# Patient Record
Sex: Male | Born: 1945 | Race: Black or African American | Hispanic: No | Smoking: Never smoker
Health system: Southern US, Community
[De-identification: ages and names within clinical notes are randomized; demographics above are authoritative.]

## PROBLEM LIST (undated history)

## (undated) DIAGNOSIS — F0391 Unspecified dementia with behavioral disturbance: Secondary | ICD-10-CM

## (undated) DIAGNOSIS — N400 Enlarged prostate without lower urinary tract symptoms: Secondary | ICD-10-CM

## (undated) DIAGNOSIS — I4891 Unspecified atrial fibrillation: Secondary | ICD-10-CM

## (undated) DIAGNOSIS — R569 Unspecified convulsions: Secondary | ICD-10-CM

## (undated) DIAGNOSIS — I639 Cerebral infarction, unspecified: Secondary | ICD-10-CM

## (undated) DIAGNOSIS — I10 Essential (primary) hypertension: Secondary | ICD-10-CM

## (undated) DIAGNOSIS — I447 Left bundle-branch block, unspecified: Secondary | ICD-10-CM

## (undated) DIAGNOSIS — S2239XA Fracture of one rib, unspecified side, initial encounter for closed fracture: Secondary | ICD-10-CM

## (undated) DIAGNOSIS — H409 Unspecified glaucoma: Secondary | ICD-10-CM

## (undated) DIAGNOSIS — I2510 Atherosclerotic heart disease of native coronary artery without angina pectoris: Secondary | ICD-10-CM

## (undated) DIAGNOSIS — Z8719 Personal history of other diseases of the digestive system: Secondary | ICD-10-CM

## (undated) DIAGNOSIS — R45851 Suicidal ideations: Secondary | ICD-10-CM

## (undated) DIAGNOSIS — R55 Syncope and collapse: Principal | ICD-10-CM

## (undated) HISTORY — DX: Unspecified dementia with behavioral disturbance: F03.91

## (undated) HISTORY — DX: Suicidal ideations: R45.851

## (undated) HISTORY — PX: BRAIN SURGERY: SHX531

## (undated) HISTORY — DX: Unspecified atrial fibrillation: I48.91

## (undated) HISTORY — PX: TIBIA FRACTURE SURGERY: SHX806

## (undated) HISTORY — DX: Unspecified glaucoma: H40.9

## (undated) HISTORY — DX: Fracture of one rib, unspecified side, initial encounter for closed fracture: S22.39XA

## (undated) HISTORY — PX: CORONARY ARTERY BYPASS GRAFT: SHX141

---

## 1979-05-07 DIAGNOSIS — I639 Cerebral infarction, unspecified: Secondary | ICD-10-CM

## 1979-05-07 HISTORY — DX: Cerebral infarction, unspecified: I63.9

## 1998-09-27 ENCOUNTER — Encounter: Payer: Self-pay | Admitting: Emergency Medicine

## 1998-09-27 ENCOUNTER — Emergency Department (HOSPITAL_COMMUNITY): Admission: EM | Admit: 1998-09-27 | Discharge: 1998-09-27 | Payer: Self-pay | Admitting: Emergency Medicine

## 2006-10-31 ENCOUNTER — Ambulatory Visit: Payer: Self-pay | Admitting: Family Medicine

## 2006-10-31 ENCOUNTER — Inpatient Hospital Stay (HOSPITAL_COMMUNITY): Admission: EM | Admit: 2006-10-31 | Discharge: 2006-11-01 | Payer: Self-pay | Admitting: Emergency Medicine

## 2006-10-31 ENCOUNTER — Ambulatory Visit: Payer: Self-pay | Admitting: Cardiology

## 2006-11-01 ENCOUNTER — Encounter: Payer: Self-pay | Admitting: Cardiology

## 2006-11-01 ENCOUNTER — Ambulatory Visit: Payer: Self-pay | Admitting: Vascular Surgery

## 2006-11-16 ENCOUNTER — Emergency Department: Payer: Self-pay | Admitting: Emergency Medicine

## 2007-04-08 ENCOUNTER — Emergency Department (HOSPITAL_COMMUNITY): Admission: EM | Admit: 2007-04-08 | Discharge: 2007-04-08 | Payer: Self-pay | Admitting: Emergency Medicine

## 2008-05-08 ENCOUNTER — Emergency Department (HOSPITAL_COMMUNITY): Admission: EM | Admit: 2008-05-08 | Discharge: 2008-05-08 | Payer: Self-pay | Admitting: Emergency Medicine

## 2008-07-18 ENCOUNTER — Inpatient Hospital Stay (HOSPITAL_COMMUNITY): Admission: EM | Admit: 2008-07-18 | Discharge: 2008-07-23 | Payer: Self-pay | Admitting: Emergency Medicine

## 2008-08-08 ENCOUNTER — Ambulatory Visit: Payer: Self-pay | Admitting: Internal Medicine

## 2008-08-08 ENCOUNTER — Observation Stay (HOSPITAL_COMMUNITY): Admission: EM | Admit: 2008-08-08 | Discharge: 2008-08-11 | Payer: Self-pay | Admitting: Emergency Medicine

## 2011-01-18 NOTE — Discharge Summary (Signed)
NAME:  [name], [name] NO.:  [id number]   MEDICAL RECORD NO.:  [id number]          PATIENT TYPE:  OBV   LOCATION:  6715                         FACILITY:  [facility]   PHYSICIAN:  Alvester Morin, M.D.  DATE OF BIRTH:  Dec 20, 1945   DATE OF ADMISSION:  08/08/2008  DATE OF DISCHARGE:  08/11/2008                               DISCHARGE SUMMARY   DISCHARGE DIAGNOSES:  1. Dilantin toxicity.  2. Coronary artery disease status post coronary artery bypass graft.  3. Hypertension.  4. History of right temporal lobe stroke at the time of the coronary      artery bypass graft.  5. Seizure disorder.  6. History of alcohol abuse.  7. Glaucoma.  8. Gastroesophageal reflux disease.  9. Benign prostatic hypertrophy.   DISCHARGE MEDICATIONS:  1. Aspirin 81 mg p.o. daily.  2. Brimonidine 1 drop in each eye every 8 hours.  3. Dorzolamide 1 drop in each eye every 8 hours.  4. Hydrochlorothiazide 25 mg p.o. daily.  5. Metoprolol 25 mg p.o. daily.  6. Protonix 40 mg b.i.d.  7. Timolol 1 drop in each eye every 8 hours.  8. Travoprost 1 drop in each eye at bedtime.   Note, the patient's Dilantin is being held at time of discharge.   CONSULTATIONS:  None.   DISPOSITION AND FOLLOWUP:  The patient is to follow up at the [facility] on Friday, August 15, 2008 at 8:30 in the morning.  Note, his  Dilantin is being held at time of discharge and at the time of followup,  the physician is to restart his Dilantin at an appropriate dose.  The  patient had been taking 260 mg daily, as this was the amount that was  recommended at his last hospital discharge.  However, he was taking  roughly 560 daily because he was confused and did not receive good  discharge instructions at last hospitalization.   BRIEF HISTORY AND PHYSICAL:  Mr. Barfoot is a 65 year old African  American male with history of seizure disorder that started after a CVA  and recent hospitalization in November 2009  for Dilantin toxicity.  The  patient had routine blood draws on the day prior to admission, at which  time he was called by his doctor's office on the day of admission to  report on an elevated Dilantin level.  He was instructed by his doctor's  office to report to the nearest emergency room.  The patient denies any  double vision, tremor, headache, dizziness, shortness of breath, chest  pain, fever, or chills at the time of this presentation.   PHYSICAL EXAMINATION:  VITAL SIGNS:  Temperature 97.2, blood pressure  156/81, pulse 67, respirations 18, and oxygen saturation 99% on room  air.  GENERAL:  The patient is in no acute distress.  EYES:  Pupils equal, round, and reactive to light.  Extraocular  movements intact.  No nystagmus appreciated.  ENT:  Moist mucous membranes.  NECK:  No JVD.  RESPIRATIONS:  Clear to auscultation bilaterally.  Good air movement.  No wheezes appreciated.  CARDIOVASCULAR:  Regular rate  and rhythm.  No murmurs, rubs, or gallops.  GI:  Abdomen soft, nontender, and nondistended.  No guarding.  Good  bowel sounds.  EXTREMITIES:  No clubbing, cyanosis, or edema.  SKIN:  No lesions or rashes.  NEUROLOGIC:  Alert and oriented x3.  Cranial nerves II through XII  grossly intact.  Strength 5/5 in upper and lower extremities  bilaterally.  Deep tendon reflexes 2+ bilaterally.  No tremor  appreciated.  PSYCH:  Appropriate.   LABORATORY DATA:  Sodium 140, potassium 4.1, chloride 103, bicarb 28,  BUN 9, creatinine 0.95, and glucose 83.  White count 5.9, hemoglobin  14.3, and platelets 219.  Dilantin level of 40.6.   HOSPITAL COURSE:  1. Elevated Dilantin level.  The patient was admitted to the [facility] for evaluation of his elevated Dilantin      level.  At this time, it is inappropriate to say that the patient      has Dilantin toxicity and that he does not have any significant      findings on neurologic exam.  His physical exam was  largely benign      at time of admission.  As part of his treatment, his Dilantin level      was held and serial neurologic exams were obtained.  On the second      day of hospitalization, the patient had developed left-beating      nystagmus with rightward gaze.  The patient did not have any      ataxia, visual changes, nausea, vomiting, or headache or      respiratory changes at this time.  He continued to be observed and      serial neurologic exams undetermined a resolution of his ataxia on      his third day of hospitalization.  At time of discharge, the      patient's Dilantin was being held.  The primary team spoke with      Pharmacy and they determined that his Dilantin level was 29.7 at      the time of discharge.  We would not put the patient at risk for      seizure prior to his hospital followup appointment on August 15, 2008.  At December followup, it will be important for his physician      to determine a good dosing regimen for his Dilantin.  The patient      had stated that he got confused at his last hospitalization,      instead of taking the recommended 260 mg daily, he was taking 560      daily and that he was taking his old dose of 300 and his new dose      of 260 daily.  The patient education is paramount in this      situation.  2. Hypertension.  The patient's blood pressure was well controlled on      his home dosing regimen of hydrochlorothiazide and metoprolol.  3. Coronary artery disease.  The patient was maintained on his home      dose of aspirin and metoprolol for his hospitalization.  He denied      any chest pains throughout.  4. Glaucoma.  The patient was maintained on his home glaucoma drops      throughout his hospitalization.  5. Prophylaxis.  The patient got prophylactic doses of Protonix and      heparin throughout  his hospitalization.   DISCHARGE VITAL SIGNS:  Temperature 98.5, blood pressure 117/74, pulse  86, respirations 16, and  saturating 98% on room air.   DISCHARGE LABORATORY DATA:  Dilantin level was 29.7 at the time of  discharge.  This was down from 40.6 on day of admission.  The patient was discharged home in stable and improved condition.      Genia Del, MD  Electronically Signed      Alvester Morin, M.D.  Electronically Signed    ZF/MEDQ  D:  08/11/2008  T:  08/12/2008  Job:  [id number]   cc:   Dorena Dew, M.D.

## 2011-01-18 NOTE — Discharge Summary (Signed)
[name], BLOMQUIST NO.:  [id number]   MEDICAL RECORD NO.:  [id number]          PATIENT TYPE:  INP   LOCATION:  3015                         FACILITY:  [facility]   PHYSICIAN:  Michelene Gardener, MD    DATE OF BIRTH:  [birth year]   DATE OF ADMISSION:  07/18/2008  DATE OF DISCHARGE:  07/23/2008                               DISCHARGE SUMMARY   DISCHARGE DIAGNOSES:  1. Dilantin toxicity.  2. Coronary artery disease status post coronary artery bypass graft.  3. Hypertension.  4. History of right temporal lobe stroke at the time of the coronary      artery bypass graft.  5. Seizure disorder.  6. Heavy alcohol abuse.  7. Glaucoma.  8. Gastroesophageal reflux disease.  9. Benign prostatic hypertrophy.   DISCHARGE MEDICATIONS:  1. Dilantin 260 mg p.o. once a day.  2. Multivitamin 1 tablet p.o. once a day.  3. Terazosin.  4. Aspirin.  5. Hydralazine.  6. Omeprazole.  7. Metoprolol.   CONSULTATIONS:  None.   PROCEDURES:  None.   RADIOLOGY STUDIES:  None.   COURSE OF HOSPITALIZATION:  This is a 65 year old gentleman who came in  with symptoms and signs suggestive of Dilantin toxicity.  The patient  was admitted to the neuro floor.  Dilantin was held.  The patient was  started on IV fluids.  Neuro checks has been done very frequently.  His  Dilantin has been repeated every day.  Dilantin came down to 14.  At  that point, the patient was started on a reduced dose of Dilantin of 260  mg once a day.  Dilantin level remained stable on that dose.  The  patient was totally asymptomatic at the time of discharge.  He was  evaluated by physical therapy and occupational therapy.  The patient was  discharged from the hospital in a very stable condition and he was back  to his baseline.  All of his preadmission medications were continued in  the same  dosage except Dilantin that was reduced to 260 mg once a once a day but  distributed at 200 in the morning and 60 mg at  night.   TOTAL ASSESSMENT TIME:  40 minutes.      Michelene Gardener, MD  Electronically Signed     NAE/MEDQ  D:  07/25/2008  T:  07/26/2008  Job:  [id number]

## 2011-01-18 NOTE — H&P (Signed)
NAMEARMOND, CUTHRELL NO.:  [id number]   MEDICAL RECORD NO.:  [id number]          PATIENT TYPE:  INP   LOCATION:  3015                         FACILITY:  [facility]   PHYSICIAN:  Lonia Blood, M.D.       DATE OF BIRTH:  03-Jan-1946   DATE OF ADMISSION:  07/18/2008  DATE OF DISCHARGE:                              HISTORY & PHYSICAL   PRIMARY CARE PHYSICIAN:  Lorraine [place].   CHIEF COMPLAINT:  Falling.   HISTORY OF PRESENT ILLNESS:  Mr. Acree is a 65 year old African  American man with history of coronary artery disease, stroke, and  seizures who called his grandson today, complained that he has been  falling all over the house.  When his grandson arrived at the house, the  patient was unable to stand.  He was complaining also of dizziness and  seeing things.  The patient was brought to the emergency room where he  was diagnosed with Dilantin toxicity and we were called to admit him to  the hospital.   PAST MEDICAL HISTORY:  1. Coronary artery disease status post CABG.  2. Hypertension.  3. Right temporal lobe stroke at the time of the CABG.  4. Seizure disorder.  5. Heavy alcohol abuse.  6. Glaucoma.  7. Gastroesophageal reflux disease.  8. BPH.   HOME MEDICATIONS:  Multivitamin, terazosin, aspirin,  hydrochlorothiazide, Dilantin, omeprazole, and metoprolol.   ALLERGIES:  CODEINE.   SOCIAL HISTORY:  The patient lives alone.  He used to be a heavy drinker  but has stopped.  He is divorced.  He has got 2 living grandsons on whom  he relies for support, one of them is accompanying him to the emergency  room, namely Ellery Plunk, phone number [phone].   FAMILY HISTORY:  Mother died with heart attack.   REVIEW OF SYSTEMS:  As per HPI.  Negative for chest pain, shortness of  breath, abdominal pain, nausea, vomiting, or diarrhea.  All other  systems reviewed and negative.   PHYSICAL EXAMINATION:  VITAL SIGNS:  Upon admission, temperature is  97.0, blood  pressure 146/75, respiratory rate is 20, heart rate is 62.  GENERAL APPEARANCE:  He is one of an [place] American gentleman that is  sitting quietly on the stretcher.  Alert and oriented to place, person,  and time.  HEENT:  Head:  Normocephalic and atraumatic.  Eyes:  He is displaying  nystagmus.  Pupils are reactive to light and accommodation.  Throat is  clear.  NECK:  Supple.  No JVD.  CHEST:  Clear without wheezes, rhonchi, or crackles.  HEART:  Regular rate and rhythm without murmurs, rubs, or gallop.  ABDOMEN:  Soft, nontender.  Bowel sounds are present.  LOWER EXTREMITIES:  Without edema.  NEUROLOGIC:  Romberg sign is positive.  He has got difficulties with  rapid alternating movements.  Strength is 5/5 in all 4 extremities.   LABORATORY VALUES:  On admission, white blood cell count is 5.9,  hemoglobin of 14.1, platelet count is 186.  Sodium 142, potassium 3.5,  chloride 102, BUN 10, creatinine 1, glucose 134.  Dilantin  level is  41.4.   ASSESSMENT AND PLAN:  1. Mr. Sam is a 65 year old gentleman who presents with classical      signs and symptoms of Dilantin toxicity.  He will be admitted to      the Neurology Floor, placed on intravenous fluids, and close      neurological monitoring.  His Dilantin will be held.  2. Coronary artery disease status post coronary artery bypass      grafting.  The patient will be continued on aspirin, beta-blocker,      and ACE inhibitor without changes.  3. History of falls.  I do suspect these are secondary to Dilantin      toxicity.  For safety, I will have the patient be evaluated by      physical therapy and occupational therapy.  4. Gastroesophageal reflux disease.  I will continue the patient on      PPI.  5. DVT prophylaxis will be done using heparin.      Lonia Blood, M.D.  Electronically Signed     SL/MEDQ  D:  07/18/2008  T:  07/19/2008  Job:  [id number]   cc:   [facility] Administration
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
## 2011-01-21 NOTE — Discharge Summary (Signed)
NAMECHRISTIAN, [name] NO.:  [id number]   MEDICAL RECORD NO.:  [id number]          PATIENT TYPE:  INP   LOCATION:  3703                         FACILITY:  [facility]   PHYSICIAN:  Johney Maine, M.D.   DATE OF BIRTH:  1946-05-14   DATE OF ADMISSION:  10/31/2006  DATE OF DISCHARGE:  11/01/2006                               DISCHARGE SUMMARY   ADMITTING DIAGNOSES:  1. Syncope.  2. Hypertension.  3. Coronary artery disease.  4. History of cerebrovascular accident.  5. Bradycardia.  6. Seizure disorder.   DISCHARGE DIAGNOSES:  1. Syncope, resolved.  2. Bradycardia, sinus.  3. Hypertension.  4. Coronary artery disease.  5. History of cerebrovascular accident.  6. Seizure disorder.   HOSPITAL COURSE:  This is a pleasant 64 year old male who presented to  the emergency room after a witnessed episode of blacking out.  He  stated to have a premonition before this incident with left-sided  burning and tingling in his left lower and upper extremity and face.  He  continued to have this during his admission in the emergency room;  however, afterwards he no longer had these feelings.  He had no focal  deficits throughout his entire hospital stay.  His vitals remain stable,  except for some sinus bradycardia.  Please see the following for  details.   PROBLEM:  1. Syncope:  We ruled this patient out for acute cardiac events.      Although his last troponin cardiac enzymes was mildly elevated at      0.08, we felt this was not due to an acute event.  Cardiology did      evaluate the gentleman in the emergency room and cleared him      stating he only had left ventricular hypertrophy.  Although he had      bradycardia, this resolved by the day of discharge and when it was      bradycardia, it was sinus bradycardia.  He had no other telemetry      events.  His orthostatic blood pressures were within normal limits.      He possibly could have had a seizure, as this was an  aura with      seizure activity; however, his dilantin level remained therapeutic      and he no longer had any of these feelings.  He could follow up      with his primary care physician at [facility] for      further evaluation if needed, including an EEG.  A CT of the head      showed no acute process.  TSH was within normal limits except for a      low potassium on day of discharge of 3.2, all of his laboratory      values remained within normal limits.  We checked a fasting lipid      panel to make sure he would not have the risk for embolic stroke      and except for a low HDL and mildly elevated triglycerides of 191,      HDL  35, he is otherwise within normal limits.  Urinalysis was      within normal limits.  We did have an echocardiogram performed;      however, it had not been read or dictated at the time of discharge.      We will follow this up if there is any concern after discharge.      Carotid Dopplers were negative.  2. History of a cerebrovascular accident:  Please note that this      patient initially we felt could have had a transient ischemic      attack; however, he remained with no focal deficits.  At the time      of discharge, we felt he did not have a stroke or a transient      ischemic attack, however, needs to remain on aspirin      therapeutically.  3. Hypertension:  We continued with his hydrochlorothiazide but held      his terazosin and nifedipine due to bradycardia and possible      orthostatic hypotension.  His blood pressures remained elevated on      day of discharge.  We started lisinopril 5 mg by mouth daily.  We      ask that his primary care physician follow up with basic metabolic      panel to assess for creatinine and potassium, since we did start      this angiotensin-converting enzymes inhibitor.  We also continued      to hold his terazosin and his nifedipine on day of discharge until      it could be further evaluated by  his primary care physician.  To      treat his coronary artery disease, a beta-blocker is indicated as      is an angiotensin-converting enzymes inhibitor.  The terazosin I      feel is for most likely a benign prostatic hypertrophy; however, it      is unclear and I am afraid it could have contributed to a syncopal      episode.  4. Coronary artery disease:  Stable.  We continued his aspirin.  5. Seizure disorder:  Possibly this was seizure activity; however, it      was not witnessed and no focal deficits or postictal state was      noted.  His dilantin level was therapeutic.  We continued his      dilantin during his hospital stay and at discharge.  His primary      care physician at [facility] in [place] can      assess whether this patient needs an outpatient EEG or not.   DISCHARGE MEDICATIONS:  This patient was discharged on:  1. Hydrochlorothiazide 25 mg 1 tab p.o. daily.  2. Dilantin 100 mg 3 tabs p.o. daily.  3. Cosopt eye drops both eyes twice daily.  4. Travatan drops.  5. Omeprazole 40 mg 1 tablet p.o. daily.  6. Aspirin 81 mg 1 tablet p.o. daily.  7. Lisinopril 5 mg 1 tablet p.o. daily.   DISCHARGE FOLLOWUP INSTRUCTIONS:  The patient was instructed to follow  up with [facility] and set up an appointment with his primary care  physician, Dr. __________ , telephone number is [phone].  Please  note that I attempted on several occasions to try to make his  appointment at the [facility]; however, I continued to be on hold and  the patient was adamantly demanding to be discharged as  soon as  possible.  He did understand that it was very, very important for him to  call his physician and set up an appointment and he was not willing to  wait for me to do this, and he said he would it himself.   Patient was instructed to not drive.  He was also advised that he should really not be living alone as this episode could happen again.  He  adamantly stated  he wants to live alone and does not care, he could do  this by himself and he will return back to his home with no other  support needed.   DISCHARGE FOLLOWUP:  We will follow up an echo and if it is abnormal, we  can let his primary care physician know.   Patient was instructed to stopping taking nifedipine and terazosin until  he sees his primary care physician at the [facility] in [place].   This patient was discharged in improved and stable condition.   IMAGES:  1. Head CT showed no acute process or bleed.  2. Chest x-ray was within normal limits.  3. Carotid Dopplers negative for stenosis.  4. Echocardiogram results are pending at this time.   CONSULTATIONS:  Cardiology was consulted prior to [place] seeing this patient  informally in the emergency room to evaluate EKG, otherwise no consults.   PERTINENT LABORATORY DATA:  Please note that except for a low potassium  on day of discharge of 3.3, which we repleted with 40 mEq of potassium,  his basic metabolic panel and comprehensive metabolic panel remain  within normal limits.  His troponins were essentially normal except for  one of 0.08, which was his last set.  Dilantin level was within normal  limits and therapeutic level.  No other pertinent labs.  Fasting lipid  panel showed total cholesterol 145, triglycerides 191, HDL 35, LDL 72,  TSH normal at 1.199.  Urinalysis was totally within normal limits.           ______________________________  Johney Maine, M.D.     JT/MEDQ  D:  11/01/2006  T:  11/02/2006  Job:  [id number]   cc:   William A. Hensel, M.D.  Dr. Tammy Sours

## 2011-06-07 LAB — COMPREHENSIVE METABOLIC PANEL
ALT: 30
AST: 36
Albumin: 3.2 — ABNORMAL LOW
Alkaline Phosphatase: 86
BUN: 8
CO2: 27
Calcium: 8.8
Chloride: 103
Creatinine, Ser: 0.9
GFR calc Af Amer: >60
GFR calc non Af Amer: >60
Glucose, Bld: 121 — ABNORMAL HIGH
Potassium: 4
Sodium: 142
Total Bilirubin: 0.5
Total Protein: 6.3

## 2011-06-07 LAB — BASIC METABOLIC PANEL
CO2: 24
Calcium: 8.5
GFR calc Af Amer: >60
GFR calc Af Amer: >60
GFR calc Af Amer: >60
GFR calc non Af Amer: >60
GFR calc non Af Amer: >60
GFR calc non Af Amer: >60
GFR calc non Af Amer: >60
Glucose, Bld: 89
Glucose, Bld: 91
Glucose, Bld: 97
Potassium: 3.1 — ABNORMAL LOW
Potassium: 3.5
Potassium: 3.8
Potassium: 3.9
Sodium: 141
Sodium: 141
Sodium: 142
Sodium: 144

## 2011-06-07 LAB — CBC
HCT: 41.2
HCT: 42.8
Hemoglobin: 13.8
Hemoglobin: 14.1
MCHC: 32.9
MCV: 84.1
Platelets: 165
Platelets: 186
RBC: 5.09
RDW: 13.9
WBC: 4.6
WBC: 5.9

## 2011-06-07 LAB — POCT I-STAT, CHEM 8
BUN: 10
Calcium, Ion: 1.25
Creatinine, Ser: 1.1
TCO2: 30

## 2011-06-07 LAB — DIFFERENTIAL
Basophils Absolute: 0
Basophils Relative: 0
Eosinophils Absolute: 0.3
Eosinophils Relative: 5
Lymphocytes Relative: 22
Lymphs Abs: 1.3
Monocytes Absolute: 0.6
Monocytes Relative: 11
Neutro Abs: 3.7
Neutrophils Relative %: 62

## 2011-06-07 LAB — URINALYSIS, ROUTINE W REFLEX MICROSCOPIC
Bilirubin Urine: NEGATIVE
Glucose, UA: NEGATIVE
Hgb urine dipstick: NEGATIVE
Ketones, ur: NEGATIVE
Protein, ur: NEGATIVE

## 2011-06-07 LAB — PHENYTOIN LEVEL, TOTAL
Phenytoin Lvl: 14.6
Phenytoin Lvl: 21.6 — ABNORMAL HIGH
Phenytoin Lvl: 41.4

## 2011-06-08 LAB — POCT I-STAT, CHEM 8
BUN: 13
Calcium, Ion: 1.15
Chloride: 105
Glucose, Bld: 106 — ABNORMAL HIGH
HCT: 44
Potassium: 3.7

## 2011-06-08 LAB — POCT CARDIAC MARKERS: Troponin i, poc: <0.05

## 2011-06-10 LAB — BASIC METABOLIC PANEL
BUN: 14 mg/dL (ref 6–23)
CO2: 29 mEq/L (ref 19–32)
Calcium: 9.7 mg/dL (ref 8.4–10.5)
Chloride: 103 mEq/L (ref 96–112)
Creatinine, Ser: 0.95 mg/dL (ref 0.4–1.5)
Glucose, Bld: 97 mg/dL (ref 70–99)

## 2011-06-10 LAB — CBC
HCT: 42.6 % (ref 39.0–52.0)
MCHC: 33.1 g/dL (ref 30.0–36.0)
MCV: 82.9 fL (ref 78.0–100.0)
MCV: 84.5 fL (ref 78.0–100.0)
Platelets: 205 10*3/uL (ref 150–400)
Platelets: 219 10*3/uL (ref 150–400)
RBC: 5 MIL/uL (ref 4.22–5.81)
RBC: 5.14 MIL/uL (ref 4.22–5.81)
RDW: 14.7 % (ref 11.5–15.5)
WBC: 5.9 10*3/uL (ref 4.0–10.5)

## 2011-06-10 LAB — LIPID PANEL
HDL: 40 mg/dL (ref >39)
LDL Cholesterol: 99 mg/dL (ref 0–99)
Triglycerides: 154 mg/dL — ABNORMAL HIGH (ref <150)

## 2011-06-10 LAB — COMPREHENSIVE METABOLIC PANEL
AST: 23 U/L (ref 0–37)
Albumin: 4.1 g/dL (ref 3.5–5.2)
Alkaline Phosphatase: 101 U/L (ref 39–117)
BUN: 9 mg/dL (ref 6–23)
CO2: 28 mEq/L (ref 19–32)
Chloride: 103 mEq/L (ref 96–112)
Creatinine, Ser: 0.95 mg/dL (ref 0.4–1.5)
GFR calc Af Amer: >60 mL/min (ref >60)
GFR calc non Af Amer: >60 mL/min (ref >60)
Potassium: 4.1 mEq/L (ref 3.5–5.1)
Total Bilirubin: 0.8 mg/dL (ref 0.3–1.2)

## 2011-06-10 LAB — DIFFERENTIAL
Basophils Absolute: 0 10*3/uL (ref 0.0–0.1)
Basophils Relative: 1 % (ref 0–1)
Eosinophils Relative: 3 % (ref 0–5)
Monocytes Absolute: 0.7 10*3/uL (ref 0.1–1.0)

## 2011-06-10 LAB — PHENYTOIN LEVEL, TOTAL
Phenytoin Lvl: 37.1 ug/mL (ref 10.0–20.0)
Phenytoin Lvl: 40.6 ug/mL (ref 10.0–20.0)
Phenytoin Lvl: 40.6 ug/mL (ref 10.0–20.0)

## 2011-06-20 LAB — I-STAT 8, (EC8 V) (CONVERTED LAB)
BUN: 16
Glucose, Bld: 98
Hemoglobin: 16
Potassium: 4.7
Sodium: 138
pH, Ven: 7.343 — ABNORMAL HIGH

## 2012-08-07 ENCOUNTER — Emergency Department (HOSPITAL_COMMUNITY): Payer: Medicare Other

## 2012-08-07 ENCOUNTER — Encounter (HOSPITAL_COMMUNITY): Payer: Self-pay | Admitting: Emergency Medicine

## 2012-08-07 ENCOUNTER — Inpatient Hospital Stay (HOSPITAL_COMMUNITY)
Admission: EM | Admit: 2012-08-07 | Discharge: 2012-08-09 | DRG: 312 | Disposition: A | Discharge status: Home Health | Payer: Medicare Other | Attending: Internal Medicine | Admitting: Internal Medicine

## 2012-08-07 DIAGNOSIS — E876 Hypokalemia: Secondary | ICD-10-CM | POA: Diagnosis present

## 2012-08-07 DIAGNOSIS — R569 Unspecified convulsions: Secondary | ICD-10-CM

## 2012-08-07 DIAGNOSIS — R55 Syncope and collapse: Principal | ICD-10-CM

## 2012-08-07 DIAGNOSIS — Z951 Presence of aortocoronary bypass graft: Secondary | ICD-10-CM

## 2012-08-07 DIAGNOSIS — I2510 Atherosclerotic heart disease of native coronary artery without angina pectoris: Secondary | ICD-10-CM | POA: Diagnosis present

## 2012-08-07 DIAGNOSIS — I10 Essential (primary) hypertension: Secondary | ICD-10-CM | POA: Diagnosis present

## 2012-08-07 DIAGNOSIS — N400 Enlarged prostate without lower urinary tract symptoms: Secondary | ICD-10-CM

## 2012-08-07 DIAGNOSIS — I447 Left bundle-branch block, unspecified: Secondary | ICD-10-CM | POA: Diagnosis present

## 2012-08-07 DIAGNOSIS — G40909 Epilepsy, unspecified, not intractable, without status epilepticus: Secondary | ICD-10-CM | POA: Diagnosis present

## 2012-08-07 DIAGNOSIS — Z8673 Personal history of transient ischemic attack (TIA), and cerebral infarction without residual deficits: Secondary | ICD-10-CM

## 2012-08-07 HISTORY — DX: Personal history of other diseases of the digestive system: Z87.19

## 2012-08-07 HISTORY — DX: Unspecified convulsions: R56.9

## 2012-08-07 HISTORY — DX: Benign prostatic hyperplasia without lower urinary tract symptoms: N40.0

## 2012-08-07 HISTORY — DX: Essential (primary) hypertension: I10

## 2012-08-07 HISTORY — DX: Syncope and collapse: R55

## 2012-08-07 HISTORY — DX: Left bundle-branch block, unspecified: I44.7

## 2012-08-07 HISTORY — DX: Atherosclerotic heart disease of native coronary artery without angina pectoris: I25.10

## 2012-08-07 HISTORY — DX: Cerebral infarction, unspecified: I63.9

## 2012-08-07 LAB — TROPONIN I: Troponin I: <0.3 ng/mL (ref <0.30)

## 2012-08-07 LAB — CBC WITH DIFFERENTIAL/PLATELET
Basophils Relative: 1 % (ref 0–1)
HCT: 42 % (ref 39.0–52.0)
Hemoglobin: 14.6 g/dL (ref 13.0–17.0)
Lymphocytes Relative: 21 % (ref 12–46)
Lymphs Abs: 1.7 10*3/uL (ref 0.7–4.0)
Monocytes Relative: 14 % — ABNORMAL HIGH (ref 3–12)
Neutro Abs: 4.8 10*3/uL (ref 1.7–7.7)
Neutrophils Relative %: 61 % (ref 43–77)
RBC: 5.41 MIL/uL (ref 4.22–5.81)
WBC: 7.8 10*3/uL (ref 4.0–10.5)

## 2012-08-07 LAB — BASIC METABOLIC PANEL
BUN: 19 mg/dL (ref 6–23)
CO2: 29 mEq/L (ref 19–32)
Chloride: 97 mEq/L (ref 96–112)
GFR calc Af Amer: >90 mL/min (ref >90)
Potassium: 2.7 mEq/L — CL (ref 3.5–5.1)

## 2012-08-07 LAB — MAGNESIUM: Magnesium: 2.2 mg/dL (ref 1.5–2.5)

## 2012-08-07 LAB — PROTIME-INR: INR: 1.17 (ref 0.00–1.49)

## 2012-08-07 MED ORDER — METOPROLOL TARTRATE 25 MG PO TABS
25.0000 mg | ORAL_TABLET | Freq: Two times a day (BID) | ORAL | Status: DC
  Administered 2012-08-07 – 2012-08-09 (×4): 25 mg via ORAL
  Filled 2012-08-07 (×5): qty 1

## 2012-08-07 MED ORDER — ACETAMINOPHEN 650 MG RE SUPP: 650.0000 mg | Freq: Four times a day (QID) | RECTAL | Status: DC | PRN

## 2012-08-07 MED ORDER — PHENYTOIN SODIUM EXTENDED 100 MG PO CAPS
300.0000 mg | ORAL_CAPSULE | Freq: Every day | ORAL | Status: DC
  Administered 2012-08-07 – 2012-08-08 (×2): 300 mg via ORAL
  Filled 2012-08-07 (×3): qty 3

## 2012-08-07 MED ORDER — ACETAMINOPHEN 325 MG PO TABS: 650.0000 mg | ORAL_TABLET | Freq: Four times a day (QID) | ORAL | Status: DC | PRN

## 2012-08-07 MED ORDER — SODIUM CHLORIDE 0.9 % IV SOLN: Freq: Once | INTRAVENOUS | Status: DC

## 2012-08-07 MED ORDER — DORZOLAMIDE HCL-TIMOLOL MAL 2-0.5 % OP SOLN
1.0000 [drp] | Freq: Two times a day (BID) | OPHTHALMIC | Status: DC
  Administered 2012-08-07 – 2012-08-09 (×4): 1 [drp] via OPHTHALMIC
  Filled 2012-08-07: qty 10

## 2012-08-07 MED ORDER — ONDANSETRON HCL 4 MG PO TABS: 4.0000 mg | ORAL_TABLET | Freq: Four times a day (QID) | ORAL | Status: DC | PRN

## 2012-08-07 MED ORDER — NIFEDIPINE ER OSMOTIC RELEASE 90 MG PO TB24
90.0000 mg | ORAL_TABLET | Freq: Every day | ORAL | Status: DC
  Administered 2012-08-08 – 2012-08-09 (×2): 90 mg via ORAL
  Filled 2012-08-07 (×2): qty 1

## 2012-08-07 MED ORDER — FINASTERIDE 5 MG PO TABS
5.0000 mg | ORAL_TABLET | Freq: Every day | ORAL | Status: DC
  Administered 2012-08-08 – 2012-08-09 (×2): 5 mg via ORAL
  Filled 2012-08-07 (×2): qty 1

## 2012-08-07 MED ORDER — SODIUM CHLORIDE 0.9 % IJ SOLN
3.0000 mL | Freq: Two times a day (BID) | INTRAMUSCULAR | Status: DC
  Administered 2012-08-07 – 2012-08-09 (×2): 3 mL via INTRAVENOUS

## 2012-08-07 MED ORDER — ONDANSETRON HCL 4 MG/2ML IJ SOLN: 4.0000 mg | Freq: Four times a day (QID) | INTRAMUSCULAR | Status: DC | PRN

## 2012-08-07 MED ORDER — POLYETHYLENE GLYCOL 3350 17 G PO PACK
17.0000 g | PACK | Freq: Every day | ORAL | Status: DC | PRN
  Filled 2012-08-07: qty 1

## 2012-08-07 MED ORDER — SODIUM CHLORIDE 0.9 % IV SOLN
10.0000 mg/kg | Freq: Once | INTRAVENOUS | Status: AC
  Administered 2012-08-07: 705 mg via INTRAVENOUS
  Filled 2012-08-07: qty 14.1

## 2012-08-07 MED ORDER — FINASTERIDE 5 MG PO TABS
5.0000 mg | ORAL_TABLET | Freq: Every day | ORAL | Status: DC
  Filled 2012-08-07: qty 1

## 2012-08-07 MED ORDER — ONDANSETRON HCL 4 MG/2ML IJ SOLN
4.0000 mg | Freq: Three times a day (TID) | INTRAMUSCULAR | Status: DC | PRN
Start: 2012-08-07 — End: 2012-08-07

## 2012-08-07 MED ORDER — PANTOPRAZOLE SODIUM 40 MG PO TBEC
40.0000 mg | DELAYED_RELEASE_TABLET | Freq: Every day | ORAL | Status: DC
  Administered 2012-08-08 – 2012-08-09 (×2): 40 mg via ORAL
  Filled 2012-08-07 (×2): qty 1

## 2012-08-07 MED ORDER — POTASSIUM CHLORIDE CRYS ER 20 MEQ PO TBCR
80.0000 meq | EXTENDED_RELEASE_TABLET | Freq: Once | ORAL | Status: AC
  Administered 2012-08-07: 80 meq via ORAL
  Filled 2012-08-07: qty 4

## 2012-08-07 MED ORDER — TERAZOSIN HCL 5 MG PO CAPS
10.0000 mg | ORAL_CAPSULE | Freq: Every day | ORAL | Status: DC
  Administered 2012-08-07 – 2012-08-08 (×2): 10 mg via ORAL
  Filled 2012-08-07 (×3): qty 2

## 2012-08-07 MED ORDER — CARBOXYMETHYLCELLULOSE SODIUM 1 % OP SOLN: 1.0000 [drp] | Freq: Every day | OPHTHALMIC | Status: DC

## 2012-08-07 MED ORDER — LATANOPROST 0.005 % OP SOLN
1.0000 [drp] | Freq: Every day | OPHTHALMIC | Status: DC
  Administered 2012-08-07: 1 [drp] via OPHTHALMIC
  Filled 2012-08-07: qty 2.5

## 2012-08-07 MED ORDER — POTASSIUM CHLORIDE CRYS ER 20 MEQ PO TBCR
20.0000 meq | EXTENDED_RELEASE_TABLET | Freq: Two times a day (BID) | ORAL | Status: DC
  Administered 2012-08-07 – 2012-08-09 (×4): 20 meq via ORAL
  Filled 2012-08-07 (×6): qty 1

## 2012-08-07 MED ORDER — POTASSIUM CHLORIDE IN NACL 40-0.9 MEQ/L-% IV SOLN
INTRAVENOUS | Status: AC
  Administered 2012-08-07 – 2012-08-08 (×3): via INTRAVENOUS
  Filled 2012-08-07 (×3): qty 1000

## 2012-08-07 MED ORDER — POLYVINYL ALCOHOL 1.4 % OP SOLN
1.0000 [drp] | Freq: Every day | OPHTHALMIC | Status: DC
  Administered 2012-08-07 – 2012-08-09 (×5): 1 [drp] via OPHTHALMIC
  Filled 2012-08-07: qty 15

## 2012-08-07 MED ORDER — ASPIRIN 81 MG PO CHEW
81.0000 mg | CHEWABLE_TABLET | Freq: Every day | ORAL | Status: DC
  Administered 2012-08-08 – 2012-08-09 (×2): 81 mg via ORAL
  Filled 2012-08-07 (×2): qty 1

## 2012-08-07 MED ORDER — ALBUTEROL SULFATE (5 MG/ML) 0.5% IN NEBU: 2.5000 mg | INHALATION_SOLUTION | RESPIRATORY_TRACT | Status: DC | PRN

## 2012-08-07 MED ORDER — ADULT MULTIVITAMIN W/MINERALS CH
1.0000 | ORAL_TABLET | Freq: Every day | ORAL | Status: DC
  Administered 2012-08-08 – 2012-08-09 (×2): 1 via ORAL
  Filled 2012-08-07 (×2): qty 1

## 2012-08-07 MED ORDER — GUAIFENESIN-DM 100-10 MG/5ML PO SYRP: 5.0000 mL | ORAL_SOLUTION | ORAL | Status: DC | PRN

## 2012-08-07 NOTE — ED Notes (Signed)
EDP at the bedside.  ?

## 2012-08-07 NOTE — ED Notes (Signed)
EDP and Lab at bedside.

## 2012-08-07 NOTE — ED Notes (Signed)
Admitting MD at the bedside.  

## 2012-08-07 NOTE — ED Provider Notes (Addendum)
History     CSN: [id number]  Arrival date & time 08/07/12  1216   First MD Initiated Contact with Patient 08/07/12 1246      Chief Complaint  Patient presents with  . Loss of Consciousness    (Consider location/radiation/quality/duration/timing/severity/associated sxs/prior treatment) HPI Comments: Pt comes in with cc of syncope. Pt has hx of prostate problems, seizures, on dilantin. He reports that he recalls getting off the bus today, and cant recall what happened thereafter. Per EMS, patient collapsed at the bus stop and then he was noted to have some shaking. Pt stats he is on dilantin for his seizures, and that the seizure is pretty well controlled. He is taking his meds, and denies incontinence. He also typically has an aura before the seizure - no aura today. No hx of CAD, no syncope in the past, no hx of PE, DVT.  Patient is a 66 y.o. male presenting with syncope. The history is provided by the patient.  Loss of Consciousness Pertinent negatives include no chest pain, no abdominal pain and no shortness of breath.    Past Medical History  Diagnosis Date  . Seizures   . Hypertension     No past surgical history on file.  No family history on file.  History  Substance Use Topics  . Smoking status: Never Smoker   . Smokeless tobacco: Not on file  . Alcohol Use: No      Review of Systems  Constitutional: Negative for activity change and appetite change.  HENT: Negative for neck pain.   Respiratory: Negative for cough and shortness of breath.   Cardiovascular: Positive for syncope. Negative for chest pain.  Gastrointestinal: Negative for abdominal pain.  Genitourinary: Negative for dysuria.  Neurological: Positive for syncope. Negative for seizures.    Allergies  Codeine  Home Medications   Current Outpatient Rx  Name  Route  Sig  Dispense  Refill  . ACETAMINOPHEN 325 MG PO TABS   Oral   Take 650 mg by mouth every 6 (six) hours as needed. For pain         . GAVISCON PO   Oral   Take 1 tablet by mouth every 6 (six) hours as needed.         . ASPIRIN 81 MG PO CHEW   Oral   Chew 81 mg by mouth daily.         Marland Kitchen CARBOXYMETHYLCELLULOSE SODIUM 1 % OP SOLN   Both Eyes   Place 1 drop into both eyes 5 (five) times daily.         . DORZOLAMIDE HCL-TIMOLOL MAL 22.3-6.8 MG/ML OP SOLN   Both Eyes   Place 1 drop into both eyes 2 (two) times daily.         Marland Kitchen FINASTERIDE 5 MG PO TABS   Oral   Take 5 mg by mouth daily.         Marland Kitchen HYDROCHLOROTHIAZIDE 25 MG PO TABS   Oral   Take 25 mg by mouth daily.         Marland Kitchen LATANOPROST 0.005 % OP SOLN   Both Eyes   Place 1 drop into both eyes at bedtime.         Marland Kitchen METOPROLOL TARTRATE 25 MG PO TABS   Oral   Take 25 mg by mouth 2 (two) times daily.         . ADULT MULTIVITAMIN W/MINERALS CH   Oral   Take 1 tablet by mouth daily.         Marland Kitchen  NIFEDIPINE ER 90 MG PO TB24   Oral   Take 90 mg by mouth daily.         Marland Kitchen OMEPRAZOLE 20 MG PO CPDR   Oral   Take 20 mg by mouth daily.         Marland Kitchen PHENYTOIN SODIUM EXTENDED 100 MG PO CAPS   Oral   Take 300 mg by mouth at bedtime.         Marland Kitchen POTASSIUM CHLORIDE CRYS ER 20 MEQ PO TBCR   Oral   Take 20 mEq by mouth 2 (two) times daily.         Marland Kitchen TERAZOSIN HCL 10 MG PO CAPS   Oral   Take 10 mg by mouth at bedtime.           BP 151/96  Pulse 90  Temp 98.1 F (36.7 C) (Oral)  Resp 20  SpO2 99%  Physical Exam  Nursing note and vitals reviewed. Constitutional: He is oriented to person, place, and time. He appears well-developed.  HENT:  Head: Normocephalic and atraumatic.  Eyes: Conjunctivae normal and EOM are normal. Pupils are equal, round, and reactive to light.  Neck: Normal range of motion. Neck supple. No JVD present.  Cardiovascular: Normal rate and normal heart sounds.  Exam reveals no friction rub.   No murmur heard. Pulmonary/Chest: Effort normal and breath sounds normal. No respiratory distress. He has no wheezes.   Abdominal: Soft. Bowel sounds are normal. He exhibits no distension. There is no tenderness. There is no rebound and no guarding.  Neurological: He is alert and oriented to person, place, and time.  Skin: Skin is warm and dry.    ED Course  Procedures (including critical care time)  Labs Reviewed  CBC WITH DIFFERENTIAL - Abnormal; Notable for the following:    MCV 77.6 (*)     Monocytes Relative 14 (*)     Monocytes Absolute 1.1 (*)     All other components within normal limits  BASIC METABOLIC PANEL - Abnormal; Notable for the following:    Potassium 2.7 (*)     Glucose, Bld 101 (*)     GFR calc non Af Amer 84 (*)     All other components within normal limits  MAGNESIUM  TROPONIN I  PHENYTOIN LEVEL, FREE   Dg Chest Port 1 View  08/07/2012  *RADIOLOGY REPORT*  Clinical Data: Loss of consciousness, history seizures, hypertension  PORTABLE CHEST - 1 VIEW  Comparison: Portable exam 1309 hours compared to 10/31/2006  Findings: Enlargement of cardiac silhouette post CABG. Large hiatal hernia. Rotated to the right. Mediastinal contours and pulmonary vascularity normal. Lungs clear. No pleural effusion or pneumothorax. Bones unremarkable.  IMPRESSION: Enlargement of cardiac silhouette post CABG. Large hiatal hernia.   Original Report Authenticated By: Ulyses Southward, M.D.      No diagnosis found.    MDM   Date: 08/07/2012  Rate: 77  Rhythm: normal sinus rhythm  QRS Axis: left  Intervals: PR prolonged  ST/T Wave abnormalities: nonspecific ST/T changes  Conduction Disutrbances:first-degree A-V block  and left bundle branch block  Narrative Interpretation:   Old EKG Reviewed: none available PVCs seen as well  DDx includes: Orthostatic hypotension Stroke Vertebral artery dissection/stenosis Dysrhythmia PE Vasovagal/neurocardiogenic syncope Aortic stenosis Valvular disorder/Cardiomyopathy Anemia Seizure  Pt comes in with cc of syncope. Pt is new to our system. No known CAD,  he has seizures, but doesn't think he had a seizure today. EKG shows LBBB, PVCs. Will get  basic labs and orthostatics and reassess. Given the EKG, likely an admission.  3:29 PM Has hypokalemia, K+ is 2.7, Will give potassium. He is on K supplements, unknown source of K+ loss.    Derwood Kaplan, MD 08/07/12 1530  3:50 PM Date: 08/07/2012  Rate: 78  Rhythm: normal sinus rhythm  QRS Axis: left  Intervals: PR prolonged  ST/T Wave abnormalities: nonspecific ST/T changes  Conduction Disutrbances:first-degree A-V block  and left bundle branch block  Narrative Interpretation:   Old EKG Reviewed: none available PVCs seen as well   3:52 PM Neurology consulted per Medicine team request. They will see the patient and place a note.  Derwood Kaplan, MD 08/07/12 [phone]

## 2012-08-07 NOTE — H&P (Addendum)
[facility]                                                                                    Patient Demographics  [name], is a 66 y.o. male  CSN: [id number]  MRN: [id number]  DOB - [birth year]  Admit Date - 08/07/2012  Outpatient Primary MD for the patient is DEFAULT,PROVIDER, MD   With History of -  Past Medical History  Diagnosis Date  . Seizures   . Hypertension   . LBBB (left bundle branch block)   . BPH (benign prostatic hyperplasia)       No past surgical history on file.  in for   Chief Complaint  Patient presents with  . Loss of Consciousness     HPI  [name]  is a 66 y.o. male, with history of seizures and takes Dilantin for at, hypertension, TIA in the past, BPH, who had 1 seizure episode last month and usually has one to 2 seizures of the ear presents to the hospital after he passed out at a bus stop today, this happened about 5-6 hours ago, he presented to the ER about 4 hours ago, patient states that he does not recall what happened at the bus stop, he lost about 1-2 hours of time and the next thing he remembers is he was in the ER, apparently some bystanders saw shaking like activity, patient denies any tongue bites or bowel bladder incontinence, he did not have any aura, denies any headache problems with vision or hearing, he denies any chest pain now at that time, denies any palpitations now or at that time. He was not orthostatic in the ER, he denies any fever chills, denies skipping any medications, currently he has negative review of systems.    Review of Systems  Currently negative ROS  In addition to the HPI above,  No Fever-chills, No Headache, No changes with Vision or hearing, No problems swallowing food or Liquids, No Chest pain, Cough or Shortness of Breath, No Abdominal pain, No Nausea or Vommitting, Bowel movements are regular, No Blood in stool or Urine, No dysuria, No new skin rashes or bruises, No new  joints pains-aches,  No new weakness, tingling, numbness in any extremity, No recent weight gain or loss, No polyuria, polydypsia or polyphagia, No significant Mental Stressors.  A full 10 point Review of Systems was done, except as stated above, all other Review of Systems were negative.   Social History History  Substance Use Topics  . Smoking status: Never Smoker   . Smokeless tobacco: Not on file  . Alcohol Use: No      Family History No seizures,CAD  Prior to Admission medications   Medication Sig Start Date End Date Taking? Authorizing Provider  acetaminophen (TYLENOL) 325 MG tablet Take 650 mg by mouth every 6 (six) hours as needed. For pain   Yes Historical Provider, MD  Alum Hydroxide-Mag Carbonate (GAVISCON PO) Take 1 tablet by mouth every 6 (six) hours as needed.   Yes Historical Provider, MD  aspirin 81 MG chewable tablet Chew 81 mg by mouth daily.   Yes Historical Provider, MD  carboxymethylcellulose 1 %  ophthalmic solution Place 1 drop into both eyes 5 (five) times daily.   Yes Historical Provider, MD  dorzolamide-timolol (COSOPT) 22.3-6.8 MG/ML ophthalmic solution Place 1 drop into both eyes 2 (two) times daily.   Yes Historical Provider, MD  finasteride (PROSCAR) 5 MG tablet Take 5 mg by mouth daily.   Yes Historical Provider, MD  hydrochlorothiazide (HYDRODIURIL) 25 MG tablet Take 25 mg by mouth daily.   Yes Historical Provider, MD  latanoprost (XALATAN) 0.005 % ophthalmic solution Place 1 drop into both eyes at bedtime.   Yes Historical Provider, MD  metoprolol tartrate (LOPRESSOR) 25 MG tablet Take 25 mg by mouth 2 (two) times daily.   Yes Historical Provider, MD  Multiple Vitamin (MULTIVITAMIN WITH MINERALS) TABS Take 1 tablet by mouth daily.   Yes Historical Provider, MD  NIFEdipine (ADALAT CC) 90 MG 24 hr tablet Take 90 mg by mouth daily.   Yes Historical Provider, MD  omeprazole (PRILOSEC) 20 MG capsule Take 20 mg by mouth daily.   Yes Historical Provider, MD   phenytoin (DILANTIN) 100 MG ER capsule Take 300 mg by mouth at bedtime.   Yes Historical Provider, MD  potassium chloride SA (K-DUR,KLOR-CON) 20 MEQ tablet Take 20 mEq by mouth 2 (two) times daily.   Yes Historical Provider, MD  terazosin (HYTRIN) 10 MG capsule Take 10 mg by mouth at bedtime.   Yes Historical Provider, MD    Allergies  Allergen Reactions  . Codeine Rash    Physical Exam  Vitals  Blood pressure 150/91, pulse 77, temperature 98.1 F (36.7 C), temperature source Oral, resp. rate 22, SpO2 99.00%.   1. General elderly AA male lying in bed in NAD,     2. Normal affect and insight, Not Suicidal or Homicidal, Awake Alert, Oriented X 3.  3. No F.N deficits, ALL C.Nerves Intact, Strength 5/5 all 4 extremities, Sensation intact all 4 extremities, Plantars down going.  4. Ears and Eyes appear Normal, Conjunctivae clear, PERRLA. Moist Oral Mucosa.  5. Supple Neck, No JVD, No cervical lymphadenopathy appriciated, No Carotid Bruits.  6. Symmetrical Chest wall movement, Good air movement bilaterally, CTAB.  7. RRR, No Gallops, Rubs or Murmurs, No Parasternal Heave.  8. Positive Bowel Sounds, Abdomen Soft, Non tender, No organomegaly appriciated,No rebound -guarding or rigidity.  9.  No Cyanosis, Normal Skin Turgor, No Skin Rash or Bruise.  10. Good muscle tone,  joints appear normal , no effusions, Normal ROM.  11. No Palpable Lymph Nodes in Neck or Axillae     Data Review  CBC  Lab 08/07/12 1339  WBC 7.8  HGB 14.6  HCT 42.0  PLT 156  MCV 77.6*  MCH 27.0  MCHC 34.8  RDW 14.0  LYMPHSABS 1.7  MONOABS 1.1*  EOSABS 0.2  BASOSABS 0.0  BANDABS --   ------------------------------------------------------------------------------------------------------------------  Chemistries   Lab 08/07/12 1339  NA 136  K 2.7*  CL 97  CO2 29  GLUCOSE 101*  BUN 19  CREATININE 0.93  CALCIUM 9.4  MG 2.2  AST --  ALT --  ALKPHOS --  BILITOT --    ------------------------------------------------------------------------------------------------------------------ CrCl is unknown because there is no height on file for the current visit. ------------------------------------------------------------------------------------------------------------------ No results found for this basename: TSH,T4TOTAL,FREET3,T3FREE,THYROIDAB in the last 72 hours   Coagulation profile No results found for this basename: INR:5,PROTIME:5 in the last 168 hours ------------------------------------------------------------------------------------------------------------------- No results found for this basename: DDIMER:2 in the last 72 hours -------------------------------------------------------------------------------------------------------------------  Cardiac Enzymes No results found for this basename:  CK:3,CKMB:3,TROPONINI:3,MYOGLOBIN:3 in the last 168 hours ------------------------------------------------------------------------------------------------------------------ No components found with this basename: POCBNP:3   ---------------------------------------------------------------------------------------------------------------  Urinalysis    Component Value Date/Time   COLORURINE YELLOW 07/18/2008 2314   APPEARANCEUR CLEAR 07/18/2008 2314   LABSPEC 1.013 07/18/2008 2314   PHURINE 6.5 07/18/2008 2314   GLUCOSEU NEGATIVE 07/18/2008 2314   HGBUR NEGATIVE 07/18/2008 2314   BILIRUBINUR NEGATIVE 07/18/2008 2314   KETONESUR NEGATIVE 07/18/2008 2314   PROTEINUR NEGATIVE 07/18/2008 2314   UROBILINOGEN 1.0 07/18/2008 2314   NITRITE NEGATIVE 07/18/2008 2314   LEUKOCYTESUR NEGATIVE MICROSCOPIC NOT DONE ON URINES WITH NEGATIVE PROTEIN, BLOOD, LEUKOCYTES, NITRITE, OR GLUCOSE <1000 mg/dL. 07/18/2008 2314    ----------------------------------------------------------------------------------------------------------------  Imaging results:   Dg Chest Port  1 View  08/07/2012  *RADIOLOGY REPORT*  Clinical Data: Loss of consciousness, history seizures, hypertension  PORTABLE CHEST - 1 VIEW  Comparison: Portable exam 1309 hours compared to 10/31/2006  Findings: Enlargement of cardiac silhouette post CABG. Large hiatal hernia. Rotated to the right. Mediastinal contours and pulmonary vascularity normal. Lungs clear. No pleural effusion or pneumothorax. Bones unremarkable.  IMPRESSION: Enlargement of cardiac silhouette post CABG. Large hiatal hernia.   Original Report Authenticated By: Ulyses Southward, M.D.     My personal review of EKG: Rhythm NSR, LBBB    Assessment & Plan   1. Syncope collapse versus breakthrough seizure - patient would be admitted to the hospital on telemetry bed, currently does not have any focal logical deficits, will check his Dilantin level, will check EEG, syncopal workup including echo gram, carotid duplex and orthostatics, he's currently not orthostatic in the ER. Monitor for dysrhythmia or rise in troponin. Neurology has been informed to see the patient, if this looks like EEG probably patient will benefit from an MRI provided his Dilantin levels are therapeutic. Of note he is on 2 alpha blockers. However not orthostatic in the ER. We'll continue his aspirin for now 4 history of TIA.   2. Left bundle branch block - no previous EKGs, no chest symptoms, will check a troponin and cycle and every 6 hours, telemetry monitoring and echogram. Denies any previous CAD or MI history.   3. HTN. Home medications to be continued, hold HCTZ as K. is low, monitor blood pressure.    4. Hypokalemia replace by mouth and IV, magnesium stable, repeat BMP in the morning.    5. BPH. Continue home medications.    DVT Prophylaxis  SCDs   AM Labs Ordered, also please review Full Orders  Family Communication: Admission, patients condition and plan of care including tests being ordered have been discussed with the patient who indicates  understanding and agree with the plan and Code Status.  Code Status full  Disposition Plan: home  Time spent in minutes : 35  Condition Marinell Blight K M.D on 08/07/2012 at 4:20 PM  Between 7am to 7pm - Pager - [phone]  After 7pm go to [email or web address] - password [email or web address]  And look for the night coverage person covering me after hours  [facility] Office  [phone]

## 2012-08-07 NOTE — ED Notes (Signed)
Per bystanders pt was waiting at bus stop and had a ?sz or syncople episode pt does have hx of sz bystanders states shaking of some sort , is taking meds dilantin  he states. Upon ems arrival pt is aaox4 no c/o of neck or back pain stataes last sz was 1 year ago cbg was 101 bp was 150/90 pulse 90 has 20 left forearm

## 2012-08-07 NOTE — Consult Note (Addendum)
Reason for Consult: syncope Referring Physician: Dr. Thedore Mins  CC: loss of consciousness  HPI: [name] is an 66 y.o. male with a long history of seizure activity on dilantin who was returning home today on the bus after taking care of bills downtown. He remembers stepping off the bus and crossing the street. He does not remember anything further until waking up in the emergency room. No tongue biting, incontinence. Denies any medication changes, illnesses, vomiting, diarrhea or dehydration.  He did say that his preacher said to tell his medical team at the [place] about some episodes that he has been having recently. He was really unable to tell me much so I spoke with his minister who described several episodes where [name] would stand, walk to front of Church and would be confused. Also witnessed episodes of "passing out" where he would arch his back, eyes rolled back into head, and loss of counciousness. He would become confused with these as well. These have been so frequent that EMS has not been called at the patients request due to cost.   Lives alone. Does not drive. 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 States he had small stroke during that time period.  Past Medical History  Diagnosis Date  . Seizures   . Hypertension   . LBBB (left bundle branch block)   . BPH (benign prostatic hyperplasia) CAD, s/pCABG       Family history: no known seizure activity  Social History:  reports that he has never smoked. He does not have any smokeless tobacco history on file. 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  Allergies  Allergen Reactions  . Codeine Rash    Medications:  Prior to Admission:  Prescriptions prior to admission  Medication Sig Dispense Refill  . acetaminophen (TYLENOL) 325 MG tablet Take 650 mg by mouth every 6 (six) hours as needed. For pain      . Alum Hydroxide-Mag Carbonate (GAVISCON PO) Take 1 tablet by mouth every 6 (six) hours as needed.      Marland Kitchen aspirin 81  MG chewable tablet Chew 81 mg by mouth daily.      . carboxymethylcellulose 1 % ophthalmic solution Place 1 drop into both eyes 5 (five) times daily.      . dorzolamide-timolol (COSOPT) 22.3-6.8 MG/ML ophthalmic solution Place 1 drop into both eyes 2 (two) times daily.      . finasteride (PROSCAR) 5 MG tablet Take 5 mg by mouth daily.      . hydrochlorothiazide (HYDRODIURIL) 25 MG tablet Take 25 mg by mouth daily.      Marland Kitchen latanoprost (XALATAN) 0.005 % ophthalmic solution Place 1 drop into both eyes at bedtime.      . metoprolol tartrate (LOPRESSOR) 25 MG tablet Take 25 mg by mouth 2 (two) times daily.      . Multiple Vitamin (MULTIVITAMIN WITH MINERALS) TABS Take 1 tablet by mouth daily.      Marland Kitchen NIFEdipine (ADALAT CC) 90 MG 24 hr tablet Take 90 mg by mouth daily.      Marland Kitchen omeprazole (PRILOSEC) 20 MG capsule Take 20 mg by mouth daily.      . phenytoin (DILANTIN) 100 MG ER capsule Take 300 mg by mouth at bedtime.      . potassium chloride SA (K-DUR,KLOR-CON) 20 MEQ tablet Take 20 mEq by mouth 2 (two) times daily.      Marland Kitchen terazosin (HYTRIN) 10 MG capsule Take 10 mg by mouth at bedtime.  Scheduled:   . aspirin  81 mg Oral Daily  . dorzolamide-timolol  1 drop Both Eyes BID  . finasteride  5 mg Oral Daily  . latanoprost  1 drop Both Eyes QHS  . metoprolol tartrate  25 mg Oral BID  . multivitamin with minerals  1 tablet Oral Daily  . NIFEdipine  90 mg Oral Daily  . pantoprazole  40 mg Oral Daily  . phenytoin  300 mg Oral QHS  . polyvinyl alcohol  1 drop Both Eyes 5 X Daily  . potassium chloride SA  20 mEq Oral BID  . [COMPLETED] potassium chloride  80 mEq Oral Once  . sodium chloride  3 mL Intravenous Q12H  . terazosin  10 mg Oral QHS  . [DISCONTINUED] sodium chloride   Intravenous Once  . [DISCONTINUED] carboxymethylcellulose  1 drop Both Eyes 5 X Daily  . [DISCONTINUED] finasteride  5 mg Oral Daily   Continuous:   . 0.9 % NaCl with KCl 40 mEq / L 75 mL/hr at 08/07/12 1720     ROS: History obtained from chart review and minister  General ROS: negative for - chills, fatigue, fever, night sweats, weight gain or weight loss Psychological ROS: negative for - behavioral disorder, hallucinations, memory difficulties, mood swings or suicidal ideation Ophthalmic ROS: negative for - blurry vision, double vision, eye pain or loss of vision ENT ROS: negative for - epistaxis, nasal discharge, oral lesions, sore throat, tinnitus or vertigo Allergy and Immunology ROS: negative for - hives or itchy/watery eyes Hematological and Lymphatic ROS: negative for - bleeding problems, bruising or swollen lymph nodes Endocrine ROS: negative for - galactorrhea, hair pattern changes, polydipsia/polyuria or temperature intolerance Respiratory ROS: negative for - cough, hemoptysis, shortness of breath or wheezing Cardiovascular ROS: negative for - chest pain, dyspnea on exertion, edema or irregular heartbeat Gastrointestinal ROS: negative for - abdominal pain, diarrhea, hematemesis, nausea/vomiting or stool incontinence Genito-Urinary ROS: negative for - dysuria, hematuria, incontinence or urinary frequency/urgency Musculoskeletal ROS: negative for - joint swelling or muscular weakness Neurological ROS: as noted in HPI Dermatological ROS: negative for rash and skin lesion changes   Physical Examination: Blood pressure 148/82, pulse 89, temperature 97.4 F (36.3 C), temperature source Oral, resp. rate 20, height 5\' 6"  (1.676 m), weight 70.5 kg (155 lb 6.8 oz), SpO2 100.00%.  Neurologic Examination Mental Status: Alert, oriented, thought content appropriate.  Speech fluent without evidence of aphasia.  Able to follow 3 step commands without difficulty. Cranial Nerves: II: visual fields grossly normal, pupils equal, round, reactive to light and accommodation III,IV, VI: ptosis not present, extra-ocular motions intact bilaterally V,VII: smile symmetric, facial light touch sensation  normal bilaterally VIII: hearing normal bilaterally IX,X: gag reflex present XI: trapezius strength/neck flexion strength normal bilaterally XII: tongue strength normal  Motor: Right : Upper extremity   5/5    Left:     Upper extremity   5/5  Lower extremity   5/5     Lower extremity   5/5 Tone and bulk:normal tone throughout; no atrophy noted Sensory: Pinprick and light touch intact throughout, bilaterally Deep Tendon Reflexes: 2+ and symmetric throughout Plantars: Right: downgoing   Left: downgoing Cerebellar: normal finger-to-nose, normal heel-to-shin test, patient transfer from wheelchair to bed noted normal balance.    Results for orders placed during the hospital encounter of 08/07/12 (from the past 48 hour(s))  CBC WITH DIFFERENTIAL     Status: Abnormal   Collection Time   08/07/12  1:39 PM  Component Value Range Comment   WBC 7.8  4.0 - 10.5 K/uL    RBC 5.41  4.22 - 5.81 MIL/uL    Hemoglobin 14.6  13.0 - 17.0 g/dL    HCT 16.1  09.6 - 04.5 %    MCV 77.6 (*) 78.0 - 100.0 fL    MCH 27.0  26.0 - 34.0 pg    MCHC 34.8  30.0 - 36.0 g/dL    RDW 40.9  81.1 - 91.4 %    Platelets 156  150 - 400 K/uL    Neutrophils Relative 61  43 - 77 %    Neutro Abs 4.8  1.7 - 7.7 K/uL    Lymphocytes Relative 21  12 - 46 %    Lymphs Abs 1.7  0.7 - 4.0 K/uL    Monocytes Relative 14 (*) 3 - 12 %    Monocytes Absolute 1.1 (*) 0.1 - 1.0 K/uL    Eosinophils Relative 3  0 - 5 %    Eosinophils Absolute 0.2  0.0 - 0.7 K/uL    Basophils Relative 1  0 - 1 %    Basophils Absolute 0.0  0.0 - 0.1 K/uL   BASIC METABOLIC PANEL     Status: Abnormal   Collection Time   08/07/12  1:39 PM      Component Value Range Comment   Sodium 136  135 - 145 mEq/L    Potassium 2.7 (*) 3.5 - 5.1 mEq/L    Chloride 97  96 - 112 mEq/L    CO2 29  19 - 32 mEq/L    Glucose, Bld 101 (*) 70 - 99 mg/dL    BUN 19  6 - 23 mg/dL    Creatinine, Ser 7.82  0.50 - 1.35 mg/dL    Calcium 9.4  8.4 - 95.6 mg/dL    GFR calc non Af  Amer 84 (*) >90 mL/min    GFR calc Af Amer >90  >90 mL/min   MAGNESIUM     Status: Normal   Collection Time   08/07/12  1:39 PM      Component Value Range Comment   Magnesium 2.2  1.5 - 2.5 mg/dL   TROPONIN I     Status: Normal   Collection Time   08/07/12  2:05 PM      Component Value Range Comment   Troponin I <0.30  <0.30 ng/mL   TROPONIN I     Status: Normal   Collection Time   08/07/12  3:48 PM      Component Value Range Comment   Troponin I <0.30  <0.30 ng/mL     No results found for this or any previous visit (from the past 240 hour(s)).  Dg Chest Port 1 View  08/07/2012  *RADIOLOGY REPORT*  Clinical Data: Loss of consciousness, history seizures, hypertension  PORTABLE CHEST - 1 VIEW  Comparison: Portable exam 1309 hours compared to 10/31/2006  Findings: Enlargement of cardiac silhouette post CABG. Large hiatal hernia. Rotated to the right. Mediastinal contours and pulmonary vascularity normal. Lungs clear. No pleural effusion or pneumothorax. Bones unremarkable.  IMPRESSION: Enlargement of cardiac silhouette post CABG. Large hiatal hernia.   Original Report Authenticated By: Ulyses Southward, M.D.      Assessment/Plan:   66yo male found on ground after getting off bus. Unknown length of time; however he next remembers being in the ED with some confusion about what had happened. Suspect seizure; however, he states that he has had a stroke in  the past at the time of his CABG. Dilantin level.  Job Founds, MBA, MHA [facility] Pager [phone]    I have seen and evaluated the patient. I have reviewed the above note and made appropriate changes. He is alert, oriented to place and year. He had some confusion after his episode today and has had some other episodes recently concerning for seizure. I suspect that this represents seizure. He has an undetectable dilantin level, raising concern for non-compliance. He does not drive.   1) load with 10 PE/kg dilantin 2) Would  restart dilantin 300mg  QHS. 3) He currently takes PRN dilantin. I do not feel that this would be useful.  4) I do not feel that further imaging or EEG is needed given long history of known seizure disorder. I have discontinued this.  5) Since syncope is in the differential, though I feel less likely, echo could be reasonable. I do not feel carotid dopplers are needed and have discontinued these. Please reorder if felt needed.   Ritta Slot, MD [facility] [phone]  If 7pm- 7am, please page neurology on call at [phone].

## 2012-08-08 DIAGNOSIS — R55 Syncope and collapse: Principal | ICD-10-CM

## 2012-08-08 DIAGNOSIS — I369 Nonrheumatic tricuspid valve disorder, unspecified: Secondary | ICD-10-CM

## 2012-08-08 LAB — TROPONIN I
Troponin I: <0.3 ng/mL (ref <0.30)
Troponin I: <0.3 ng/mL (ref <0.30)

## 2012-08-08 LAB — BASIC METABOLIC PANEL
Calcium: 8.9 mg/dL (ref 8.4–10.5)
Creatinine, Ser: 1 mg/dL (ref 0.50–1.35)
GFR calc Af Amer: 87 mL/min — ABNORMAL LOW (ref >90)

## 2012-08-08 LAB — PHENYTOIN LEVEL, TOTAL: Phenytoin Lvl: 9.8 ug/mL — ABNORMAL LOW (ref 10.0–20.0)

## 2012-08-08 LAB — TSH: TSH: 1.383 u[IU]/mL (ref 0.350–4.500)

## 2012-08-08 LAB — CBC
Platelets: 159 10*3/uL (ref 150–400)
RDW: 14.3 % (ref 11.5–15.5)
WBC: 6.4 10*3/uL (ref 4.0–10.5)

## 2012-08-08 NOTE — Progress Notes (Signed)
  Echocardiogram 2D Echocardiogram has been performed.  [name] 08/08/2012, 4:00 PM

## 2012-08-08 NOTE — Progress Notes (Signed)
[facility]             Progress Note   Subjective: No complaints. Is adamant that he has been taking 300 mg of Dilantin without fail every day.  Objective: Vital signs in last 24 hours: Temp:  [97.4 F (36.3 C)-98.2 F (36.8 C)] 98 F (36.7 C) (12/04 0823) Pulse Rate:  [48-105] 60  (12/04 0833) Resp:  [16-22] 18  (12/04 0833) BP: (104-179)/(59-96) 118/70 mmHg (12/04 0833) SpO2:  [95 %-100 %] 100 % (12/04 0833) Weight:  [70.5 kg (155 lb 6.8 oz)-71.169 kg (156 lb 14.4 oz)] 71.169 kg (156 lb 14.4 oz) (12/04 0415) Weight change:  Last BM Date: 08/07/12  Intake/Output from previous day:   Total I/O In: 360 [P.O.:360] Out: 520 [Urine:520]   Physical Exam: General: Alert, awake, oriented x3, in no acute distress. HEENT: No bruits, no goiter. Heart: Regular rate and rhythm, without murmurs, rubs, gallops. Lungs: Clear to auscultation bilaterally. Abdomen: Soft, nontender, nondistended, positive bowel sounds. Extremities: No clubbing cyanosis or edema with positive pedal pulses. Neuro: Grossly intact, nonfocal.    Lab Results: Basic Metabolic Panel:  Basename 08/08/12 0520 08/07/12 1339  NA 138 136  K 3.8 2.7*  CL 102 97  CO2 27 29  GLUCOSE 106* 101*  BUN 22 19  CREATININE 1.00 0.93  CALCIUM 8.9 9.4  MG -- 2.2  PHOS -- --   CBC:  Basename 08/08/12 0520 08/07/12 1339  WBC 6.4 7.8  NEUTROABS -- 4.8  HGB 12.8* 14.6  HCT 37.6* 42.0  MCV 79.2 77.6*  PLT 159 156   Cardiac Enzymes:  Basename 08/08/12 0520 08/07/12 2328 08/07/12 1736  CKTOTAL -- -- --  CKMB -- -- --  CKMBINDEX -- -- --  TROPONINI <0.30 <0.30 <0.30   Hemoglobin A1C:  Basename 08/07/12 1741  HGBA1C 5.9*   Thyroid Function Tests:  Basename 08/07/12 1741  TSH 1.383  T4TOTAL --  FREET4 --  T3FREE --  THYROIDAB --   Coagulation:  Basename 08/07/12 1741  LABPROT 14.7  INR 1.17    Studies/Results: Dg Chest Port 1 View  08/07/2012  *RADIOLOGY REPORT*  Clinical Data:  Loss of consciousness, history seizures, hypertension  PORTABLE CHEST - 1 VIEW  Comparison: Portable exam 1309 hours compared to 10/31/2006  Findings: Enlargement of cardiac silhouette post CABG. Large hiatal hernia. Rotated to the right. Mediastinal contours and pulmonary vascularity normal. Lungs clear. No pleural effusion or pneumothorax. Bones unremarkable.  IMPRESSION: Enlargement of cardiac silhouette post CABG. Large hiatal hernia.   Original Report Authenticated By: Ulyses Southward, M.D.     Medications: Scheduled Meds:   . aspirin  81 mg Oral Daily  . dorzolamide-timolol  1 drop Both Eyes BID  . finasteride  5 mg Oral Daily  . [COMPLETED] fosPHENYtoin (CEREBYX) IV  10 mg PE/kg Intravenous Once  . latanoprost  1 drop Both Eyes QHS  . metoprolol tartrate  25 mg Oral BID  . multivitamin with minerals  1 tablet Oral Daily  . NIFEdipine  90 mg Oral Daily  . pantoprazole  40 mg Oral Daily  . phenytoin  300 mg Oral QHS  . polyvinyl alcohol  1 drop Both Eyes 5 X Daily  . potassium chloride SA  20 mEq Oral BID  . [COMPLETED] potassium chloride  80 mEq Oral Once  . sodium chloride  3 mL Intravenous Q12H  . terazosin  10 mg Oral QHS  . [DISCONTINUED] sodium chloride   Intravenous Once  . [DISCONTINUED] carboxymethylcellulose  1 drop Both Eyes 5 X Daily  . [DISCONTINUED] finasteride  5 mg Oral Daily   Continuous Infusions:   . 0.9 % NaCl with KCl 40 mEq / L 75 mL/hr at 08/08/12 1039   PRN Meds:.acetaminophen, acetaminophen, albuterol, guaiFENesin-dextromethorphan, ondansetron (ZOFRAN) IV, ondansetron, polyethylene glycol, [DISCONTINUED] ondansetron (ZOFRAN) IV  Assessment/Plan:  Principal Problem:  *Syncope Active Problems:  Hypertension  LBBB (left bundle branch block)  BPH (benign prostatic hyperplasia)  Hypokalemia   Syncope -Strongly suspect 2/2 seizures as his minister gives a history of frequent seizure activity. -Dilantin level was undetectable on admission. -Repeat level  pending to assist with further dosing adjustments. -Appreciate neurology's assistance. -Plan on Dc home in am most likely. -Repeat ECHO pending. -Has r/o for ACS. -No arrhythmias noted on tele.   Hypokalemia -Repleted PO.  Time spent coordinating care: 35 minutes.   LOS: 1 day   [facility] Pager: [phone] 08/08/2012, 1:44 PM

## 2012-08-08 NOTE — Progress Notes (Signed)
Patient becomes confused at night time, spoke with niece, she said this is his baseline, but seems to be progressing.

## 2012-08-08 NOTE — Progress Notes (Signed)
Utilization Review Completed.[name] T12/12/2011   

## 2012-08-08 NOTE — Progress Notes (Signed)
Subjective: Patient awake and alert.  Reports that he is taking 300mg  Dilantin daily on an outpatient basis and takes additional Dilantin if he feels that a seizure is coming on.  From his pastor's description it does seem that he is having multiple breakthrough seizures, though.  No further seizures noted since admission.  Objective: Current vital signs: BP 118/70  Pulse 60  Temp 98 F (36.7 C) (Oral)  Resp 18  Ht 5\' 6"  (1.676 m)  Wt 71.169 kg (156 lb 14.4 oz)  BMI 25.32 kg/m2  SpO2 100% Vital signs in last 24 hours: Temp:  [97.4 F (36.3 C)-98.2 F (36.8 C)] 98 F (36.7 C) (12/04 0823) Pulse Rate:  [48-105] 60  (12/04 0833) Resp:  [16-22] 18  (12/04 0833) BP: (104-179)/(59-96) 118/70 mmHg (12/04 0833) SpO2:  [95 %-100 %] 100 % (12/04 0833) Weight:  [70.5 kg (155 lb 6.8 oz)-71.169 kg (156 lb 14.4 oz)] 71.169 kg (156 lb 14.4 oz) (12/04 0415)  Intake/Output from previous day:   Intake/Output this shift:   Nutritional status: Cardiac  Neurologic Exam: Mental Status: Alert, oriented, thought content appropriate.  Speech fluent without evidence of aphasia.  Able to follow 3 step commands without difficulty. Cranial Nerves: II: Discs flat bilaterally; Visual fields grossly normal, pupils equal, round, reactive to light and accommodation III,IV, VI: ptosis not present, extra-ocular motions intact bilaterally V,VII: smile symmetric, facial light touch sensation normal bilaterally VIII: hearing normal bilaterally IX,X: gag reflex present XI: bilateral shoulder shrug XII: midline tongue extension Motor: Right : Upper extremity   5/5    Left:     Upper extremity   5/5  Lower extremity   5/5     Lower extremity   5/5 Tone and bulk:normal tone throughout; no atrophy noted Sensory: Pinprick and light touch intact throughout, bilaterally Deep Tendon Reflexes: 2+ and symmetric throughout Plantars: Right: downgoing   Left: downgoing Cerebellar: normal finger-to-nose and normal  heel-to-shin test   Lab Results: Basic Metabolic Panel:  Lab 08/08/12 1610 08/07/12 1339  NA 138 136  K 3.8 2.7*  CL 102 97  CO2 27 29  GLUCOSE 106* 101*  BUN 22 19  CREATININE 1.00 0.93  CALCIUM 8.9 9.4  MG -- 2.2  PHOS -- --    Liver Function Tests: No results found for this basename: AST:5,ALT:5,ALKPHOS:5,BILITOT:5,PROT:5,ALBUMIN:5 in the last 168 hours No results found for this basename: LIPASE:5,AMYLASE:5 in the last 168 hours No results found for this basename: AMMONIA:3 in the last 168 hours  CBC:  Lab 08/08/12 0520 08/07/12 1339  WBC 6.4 7.8  NEUTROABS -- 4.8  HGB 12.8* 14.6  HCT 37.6* 42.0  MCV 79.2 77.6*  PLT 159 156    Cardiac Enzymes:  Lab 08/08/12 0520 08/07/12 2328 08/07/12 1736 08/07/12 1548 08/07/12 1405  CKTOTAL -- -- -- -- --  CKMB -- -- -- -- --  CKMBINDEX -- -- -- -- --  TROPONINI <0.30 <0.30 <0.30 <0.30 <0.30    Lipid Panel: No results found for this basename: CHOL:5,TRIG:5,HDL:5,CHOLHDL:5,VLDL:5,LDLCALC:5 in the last 168 hours  CBG: No results found for this basename: GLUCAP:5 in the last 168 hours  Microbiology: No results found for this or any previous visit.  Coagulation Studies:  Basename 08/07/12 1741  LABPROT 14.7  INR 1.17    Imaging: Dg Chest Port 1 View  08/07/2012  *RADIOLOGY REPORT*  Clinical Data: Loss of consciousness, history seizures, hypertension  PORTABLE CHEST - 1 VIEW  Comparison: Portable exam 1309 hours compared to 10/31/2006  Findings: Enlargement of cardiac silhouette post CABG. Large hiatal hernia. Rotated to the right. Mediastinal contours and pulmonary vascularity normal. Lungs clear. No pleural effusion or pneumothorax. Bones unremarkable.  IMPRESSION: Enlargement of cardiac silhouette post CABG. Large hiatal hernia.   Original Report Authenticated By: Ulyses Southward, M.D.     Medications:  I have reviewed the patient's current medications. Scheduled:   . aspirin  81 mg Oral Daily  . dorzolamide-timolol   1 drop Both Eyes BID  . finasteride  5 mg Oral Daily  . [COMPLETED] fosPHENYtoin (CEREBYX) IV  10 mg PE/kg Intravenous Once  . latanoprost  1 drop Both Eyes QHS  . metoprolol tartrate  25 mg Oral BID  . multivitamin with minerals  1 tablet Oral Daily  . NIFEdipine  90 mg Oral Daily  . pantoprazole  40 mg Oral Daily  . phenytoin  300 mg Oral QHS  . polyvinyl alcohol  1 drop Both Eyes 5 X Daily  . potassium chloride SA  20 mEq Oral BID  . [COMPLETED] potassium chloride  80 mEq Oral Once  . sodium chloride  3 mL Intravenous Q12H  . terazosin  10 mg Oral QHS  . [DISCONTINUED] sodium chloride   Intravenous Once  . [DISCONTINUED] carboxymethylcellulose  1 drop Both Eyes 5 X Daily  . [DISCONTINUED] finasteride  5 mg Oral Daily    Assessment/Plan:  Patient Active Hospital Problem List: Seizure (08/07/2012)   Assessment: Patient has been loaded with Dilantin and is on 300mg  daily.  Will check level and determine if further adjustments in dose are necessary.     Plan:  1. Dilantin level today.    2. Echo pending for syncope work up.       LOS: 1 day   Thana Farr, MD [facility] [phone] 08/08/2012  8:45 AM

## 2012-08-08 NOTE — Care Management Note (Signed)
    Page 1 of 2   08/09/2012     2:41:01 PM   CARE MANAGEMENT NOTE 08/09/2012  Patient:  [name], [name]   Account Number:  [id number]  Date Initiated:  08/08/2012  Documentation initiated by:  Sumeya Yontz  Subjective/Objective Assessment:   PT ADM ON 08/07/12 WITH SYNCOPE AND CONFUSION.  PTA, PT LIVED ALONE.     Action/Plan:   MET WITH PT , NIECE AND BROTHER TO DISCUSS DC PLANS. FAMILY STATES THEY WILL ASSIST AS NEEDED AT DC.  WOULD RECOMMENT PT C/S TO DETERMINE HOME NEEDS.  WILL FOLLOW.   Anticipated DC Date:  08/10/2012   Anticipated DC Plan:  HOME W HOME HEALTH SERVICES      DC Planning Services  CM consult      [facility] Choice  HOME HEALTH   Choice offered to / List presented to:  C-1 Patient        HH arranged  HH-1 RN  HH-4 NURSE'S AIDE      HH agency  [facility]   Status of service:  Completed, signed off Medicare Important Message given?   (If response is "NO", the following Medicare IM given date fields will be blank) Date Medicare IM given:   Date Additional Medicare IM given:    Discharge Disposition:  HOME W HOME HEALTH SERVICES  Per UR Regulation:  Reviewed for med. necessity/level of care/duration of stay  If discussed at Long Length of Stay Meetings, dates discussed:    Comments:  08/09/12 Rosalita Chessman [phone] PT FOR DC HOME TODAY.  NEEDS HOME HEALTH FOLLOW UP.  FAMILY CHOOSES [facility], AND REFERRAL MADE ACCORDINGLY.  START OF CARE 24-48H POST DC DATE.  BROTHER ASKS THAT [facility] CALL HIS CONTACT #S,HOME [phone], AND CELL [phone].  PT'S PRIMARY MD IS AT [facility].  LEFT MESSAGE WITH SOCIAL WORK DEPT AT [facility] TO SEE IF PT ELIGIBLE FOR ANY IN-HOME AIDE CARE.  BROTHER'S CONTACT INFO LEFT FOR [facility].

## 2012-08-09 DIAGNOSIS — G40909 Epilepsy, unspecified, not intractable, without status epilepticus: Secondary | ICD-10-CM

## 2012-08-09 LAB — PHENYTOIN LEVEL, TOTAL: Phenytoin Lvl: 8.2 ug/mL — ABNORMAL LOW (ref 10.0–20.0)

## 2012-08-09 LAB — SEDIMENTATION RATE: Sed Rate: 15 mm/hr (ref 0–16)

## 2012-08-09 MED ORDER — PHENYTOIN SODIUM EXTENDED 200 MG PO CAPS: 200.0000 mg | ORAL_CAPSULE | Freq: Two times a day (BID) | ORAL | Status: DC

## 2012-08-09 MED ORDER — PHENYTOIN 50 MG PO CHEW
200.0000 mg | CHEWABLE_TABLET | Freq: Two times a day (BID) | ORAL | Status: DC
  Filled 2012-08-09 (×2): qty 4

## 2012-08-09 MED ORDER — PHENYTOIN SODIUM EXTENDED 100 MG PO CAPS
200.0000 mg | ORAL_CAPSULE | Freq: Two times a day (BID) | ORAL | Status: DC
  Administered 2012-08-09: 200 mg via ORAL
  Filled 2012-08-09 (×2): qty 2

## 2012-08-09 NOTE — Discharge Summary (Signed)
Physician Discharge Summary  Patient ID: [name] MRN: [id number] DOB/AGE: Jan 01, 1946 66 y.o.  Admit date: 08/07/2012 Discharge date: 08/09/2012  Primary Care Physician:  Sheila Oats, MD   Discharge Diagnoses:    Principal Problem:  *Syncope Active Problems:  Hypertension  LBBB (left bundle branch block)  BPH (benign prostatic hyperplasia)  Hypokalemia  Seizure disorder      Medication List     As of 08/09/2012  1:09 PM    TAKE these medications         acetaminophen 325 MG tablet   Commonly known as: TYLENOL   Take 650 mg by mouth every 6 (six) hours as needed. For pain      aspirin 81 MG chewable tablet   Chew 81 mg by mouth daily.      carboxymethylcellulose 1 % ophthalmic solution   Place 1 drop into both eyes 5 (five) times daily.      dorzolamide-timolol 22.3-6.8 MG/ML ophthalmic solution   Commonly known as: COSOPT   Place 1 drop into both eyes 2 (two) times daily.      finasteride 5 MG tablet   Commonly known as: PROSCAR   Take 5 mg by mouth daily.      GAVISCON PO   Take 1 tablet by mouth every 6 (six) hours as needed.      hydrochlorothiazide 25 MG tablet   Commonly known as: HYDRODIURIL   Take 25 mg by mouth daily.      latanoprost 0.005 % ophthalmic solution   Commonly known as: XALATAN   Place 1 drop into both eyes at bedtime.      metoprolol tartrate 25 MG tablet   Commonly known as: LOPRESSOR   Take 25 mg by mouth 2 (two) times daily.      multivitamin with minerals Tabs   Take 1 tablet by mouth daily.      NIFEdipine 90 MG 24 hr tablet   Commonly known as: ADALAT CC   Take 90 mg by mouth daily.      omeprazole 20 MG capsule   Commonly known as: PRILOSEC   Take 20 mg by mouth daily.      phenytoin 200 MG ER capsule   Commonly known as: DILANTIN   Take 1 capsule (200 mg total) by mouth 2 (two) times daily.      potassium chloride SA 20 MEQ tablet   Commonly known as: K-DUR,KLOR-CON   Take 20 mEq by mouth 2 (two)  times daily.      terazosin 10 MG capsule   Commonly known as: HYTRIN   Take 10 mg by mouth at bedtime.          Disposition and Follow-up:  Will be discharged home today in stable and improved condition. I have advised that he follow with his PCP at the [place] in 2 weeks.  Consults:  Neurology, Dr. Thad Ranger   Significant Diagnostic Studies:  Dg Chest Port 1 View  08/07/2012  *RADIOLOGY REPORT*  Clinical Data: Loss of consciousness, history seizures, hypertension  PORTABLE CHEST - 1 VIEW  Comparison: Portable exam 1309 hours compared to 10/31/2006  Findings: Enlargement of cardiac silhouette post CABG. Large hiatal hernia. Rotated to the right. Mediastinal contours and pulmonary vascularity normal. Lungs clear. No pleural effusion or pneumothorax. Bones unremarkable.  IMPRESSION: Enlargement of cardiac silhouette post CABG. Large hiatal hernia.   Original Report Authenticated By: Ulyses Southward, M.D.     Brief H and P: For complete details please refer to  admission H and P, but in brief patient is a 66 y/o man with h/o seizure disorder who came to the hospital after he passed out at a bus stop. We were asked to admit him for further evaluation and management.   Hospital Course:  Principal Problem:  *Syncope Active Problems:  Hypertension  LBBB (left bundle branch block)  BPH (benign prostatic hyperplasia)  Hypokalemia  Seizure disorder   Syncope -Though to be 2/2 seizure. -Rest of work up has been unremarkable.  Seizure Disorder -Dilantin was undetectable on admission. -Patient's minister states that he frequently has seizures at church. -He states he is compliant with dilantin 300 mg daily. -Neurology has recommended increasing the dose to 200 mg BID. -Has been advised to follow up with his PCP in 2 weeks.  Rest of chronic medical issues have been stable.   Time spent on Discharge: Greater than 30 minutes.  SignedChaya Jan [facility] Pager:  [phone] 08/09/2012, 1:09 PM

## 2012-08-09 NOTE — Progress Notes (Signed)
Subjective: Patient without further seizure activity since admission.  Dilantin level yesterday of 9.8.  Remains on 300mg  of Dilantin daily.  Patient does not report noncompliance but do not have medication bottle to confirm this and there do seem to be some cognitive issues at baseline.    Objective: Current vital signs: BP 121/23  Pulse 59  Temp 97.8 F (36.6 C) (Oral)  Resp 18  Ht 5\' 6"  (1.676 m)  Wt 72 kg (158 lb 11.7 oz)  BMI 25.62 kg/m2  SpO2 100% Vital signs in last 24 hours: Temp:  [97.8 F (36.6 C)-98.6 F (37 C)] 97.8 F (36.6 C) (12/05 0534) Pulse Rate:  [53-64] 59  (12/05 0534) Resp:  [18] 18  (12/05 0534) BP: (110-131)/(23-70) 121/23 mmHg (12/05 0534) SpO2:  [98 %-100 %] 100 % (12/05 0534) Weight:  [72 kg (158 lb 11.7 oz)] 72 kg (158 lb 11.7 oz) (12/05 0534)  Intake/Output from previous day: 12/04 0701 - 12/05 0700 In: 360 [P.O.:360] Out: 820 [Urine:820] Intake/Output this shift:   Nutritional status: Cardiac  Neurologic Exam: Mental Status:  Alert, oriented, thought content appropriate. Speech fluent without evidence of aphasia. Able to follow 3 step commands without difficulty.  Cranial Nerves:  II: Discs flat bilaterally; Visual fields grossly normal, pupils equal, round, reactive to light and accommodation  III,IV, VI: ptosis not present, extra-ocular motions intact bilaterally  V,VII: smile symmetric, facial light touch sensation normal bilaterally  VIII: hearing normal bilaterally  IX,X: gag reflex present  XI: bilateral shoulder shrug  XII: midline tongue extension  Motor:  Right : Upper extremity 5/5 Left: Upper extremity 5/5  Lower extremity 5/5 Lower extremity 5/5  Tone and bulk:normal tone throughout; no atrophy noted  Sensory: Pinprick and light touch intact throughout, bilaterally  Deep Tendon Reflexes: 2+ and symmetric throughout  Plantars:  Right: downgoing Left: downgoing  Cerebellar:  normal finger-to-nose and normal heel-to-shin test       Lab Results: Basic Metabolic Panel:  Lab 08/08/12 1610 08/07/12 1339  NA 138 136  K 3.8 2.7*  CL 102 97  CO2 27 29  GLUCOSE 106* 101*  BUN 22 19  CREATININE 1.00 0.93  CALCIUM 8.9 9.4  MG -- 2.2  PHOS -- --    Liver Function Tests: No results found for this basename: AST:5,ALT:5,ALKPHOS:5,BILITOT:5,PROT:5,ALBUMIN:5 in the last 168 hours No results found for this basename: LIPASE:5,AMYLASE:5 in the last 168 hours No results found for this basename: AMMONIA:3 in the last 168 hours  CBC:  Lab 08/08/12 0520 08/07/12 1339  WBC 6.4 7.8  NEUTROABS -- 4.8  HGB 12.8* 14.6  HCT 37.6* 42.0  MCV 79.2 77.6*  PLT 159 156    Cardiac Enzymes:  Lab 08/08/12 0520 08/07/12 2328 08/07/12 1736 08/07/12 1548 08/07/12 1405  CKTOTAL -- -- -- -- --  CKMB -- -- -- -- --  CKMBINDEX -- -- -- -- --  TROPONINI <0.30 <0.30 <0.30 <0.30 <0.30    Lipid Panel: No results found for this basename: CHOL:5,TRIG:5,HDL:5,CHOLHDL:5,VLDL:5,LDLCALC:5 in the last 168 hours  CBG: No results found for this basename: GLUCAP:5 in the last 168 hours  Microbiology: No results found for this or any previous visit.  Coagulation Studies:  Basename 08/07/12 1741  LABPROT 14.7  INR 1.17    Imaging: Dg Chest Port 1 View  08/07/2012  *RADIOLOGY REPORT*  Clinical Data: Loss of consciousness, history seizures, hypertension  PORTABLE CHEST - 1 VIEW  Comparison: Portable exam 1309 hours compared to 10/31/2006  Findings: Enlargement of  cardiac silhouette post CABG. Large hiatal hernia. Rotated to the right. Mediastinal contours and pulmonary vascularity normal. Lungs clear. No pleural effusion or pneumothorax. Bones unremarkable.  IMPRESSION: Enlargement of cardiac silhouette post CABG. Large hiatal hernia.   Original Report Authenticated By: Ulyses Southward, M.D.     Medications:  I have reviewed the patient's current medications. Scheduled:   . aspirin  81 mg Oral Daily  . dorzolamide-timolol  1 drop Both  Eyes BID  . finasteride  5 mg Oral Daily  . latanoprost  1 drop Both Eyes QHS  . metoprolol tartrate  25 mg Oral BID  . multivitamin with minerals  1 tablet Oral Daily  . NIFEdipine  90 mg Oral Daily  . pantoprazole  40 mg Oral Daily  . phenytoin  300 mg Oral QHS  . polyvinyl alcohol  1 drop Both Eyes 5 X Daily  . potassium chloride SA  20 mEq Oral BID  . sodium chloride  3 mL Intravenous Q12H  . terazosin  10 mg Oral QHS    Assessment/Plan:  Patient Active Hospital Problem List: Seizure (08/07/2012)   Assessment: Patient with a level of 9.8 on yesterday.  Reports taking his medication as an outpatient at the current dose.  Without further information about compliance would be concerned about increasing his maintenance dose since if he is not compliant and now chooses to be, he will become toxic.  No seizures noted since admission.  Echo unremarkable.   Plan:  1.  Stat Dilantin level.  Will make further determinations on dosage based on result.  Can then have his levels managed closely on an outpatient                  basis.    2.  Question of some dementia.  TSH normal.  B12, RPR and ESR ordered today.      LOS: 2 days   Thana Farr, MD [facility] [phone] 08/09/2012  8:04 AM

## 2012-08-10 LAB — PHENYTOIN LEVEL, FREE AND TOTAL: Phenytoin, Total: <2.5 mg/L (ref 10.0–20.0)

## 2012-09-17 ENCOUNTER — Emergency Department (HOSPITAL_COMMUNITY): Payer: Medicare Other

## 2012-09-17 ENCOUNTER — Encounter (HOSPITAL_COMMUNITY): Payer: Self-pay

## 2012-09-17 ENCOUNTER — Inpatient Hospital Stay (HOSPITAL_COMMUNITY)
Admission: EM | Admit: 2012-09-17 | Discharge: 2012-09-24 | DRG: 100 | Disposition: A | Discharge status: Skilled Nursing Facility | Payer: Medicare Other | Attending: Internal Medicine | Admitting: Internal Medicine

## 2012-09-17 DIAGNOSIS — B952 Enterococcus as the cause of diseases classified elsewhere: Secondary | ICD-10-CM | POA: Diagnosis not present

## 2012-09-17 DIAGNOSIS — K449 Diaphragmatic hernia without obstruction or gangrene: Secondary | ICD-10-CM | POA: Diagnosis present

## 2012-09-17 DIAGNOSIS — R569 Unspecified convulsions: Secondary | ICD-10-CM

## 2012-09-17 DIAGNOSIS — G9340 Encephalopathy, unspecified: Secondary | ICD-10-CM | POA: Diagnosis present

## 2012-09-17 DIAGNOSIS — S8290XS Unspecified fracture of unspecified lower leg, sequela: Secondary | ICD-10-CM

## 2012-09-17 DIAGNOSIS — S2239XA Fracture of one rib, unspecified side, initial encounter for closed fracture: Secondary | ICD-10-CM | POA: Diagnosis present

## 2012-09-17 DIAGNOSIS — L851 Acquired keratosis [keratoderma] palmaris et plantaris: Secondary | ICD-10-CM | POA: Diagnosis present

## 2012-09-17 DIAGNOSIS — I447 Left bundle-branch block, unspecified: Secondary | ICD-10-CM | POA: Diagnosis present

## 2012-09-17 DIAGNOSIS — W19XXXA Unspecified fall, initial encounter: Secondary | ICD-10-CM | POA: Diagnosis present

## 2012-09-17 DIAGNOSIS — K219 Gastro-esophageal reflux disease without esophagitis: Secondary | ICD-10-CM | POA: Diagnosis present

## 2012-09-17 DIAGNOSIS — R55 Syncope and collapse: Secondary | ICD-10-CM

## 2012-09-17 DIAGNOSIS — E872 Acidosis, unspecified: Secondary | ICD-10-CM | POA: Diagnosis present

## 2012-09-17 DIAGNOSIS — M12579 Traumatic arthropathy, unspecified ankle and foot: Secondary | ICD-10-CM | POA: Diagnosis present

## 2012-09-17 DIAGNOSIS — Z951 Presence of aortocoronary bypass graft: Secondary | ICD-10-CM

## 2012-09-17 DIAGNOSIS — N400 Enlarged prostate without lower urinary tract symptoms: Secondary | ICD-10-CM | POA: Diagnosis present

## 2012-09-17 DIAGNOSIS — IMO0002 Reserved for concepts with insufficient information to code with codable children: Secondary | ICD-10-CM | POA: Diagnosis present

## 2012-09-17 DIAGNOSIS — I2510 Atherosclerotic heart disease of native coronary artery without angina pectoris: Secondary | ICD-10-CM | POA: Diagnosis present

## 2012-09-17 DIAGNOSIS — Z7982 Long term (current) use of aspirin: Secondary | ICD-10-CM

## 2012-09-17 DIAGNOSIS — Z8673 Personal history of transient ischemic attack (TIA), and cerebral infarction without residual deficits: Secondary | ICD-10-CM

## 2012-09-17 DIAGNOSIS — G40909 Epilepsy, unspecified, not intractable, without status epilepticus: Principal | ICD-10-CM | POA: Diagnosis present

## 2012-09-17 DIAGNOSIS — X58XXXS Exposure to other specified factors, sequela: Secondary | ICD-10-CM

## 2012-09-17 DIAGNOSIS — I10 Essential (primary) hypertension: Secondary | ICD-10-CM | POA: Diagnosis present

## 2012-09-17 DIAGNOSIS — N390 Urinary tract infection, site not specified: Secondary | ICD-10-CM | POA: Diagnosis not present

## 2012-09-17 DIAGNOSIS — E876 Hypokalemia: Secondary | ICD-10-CM | POA: Diagnosis not present

## 2012-09-17 DIAGNOSIS — Z79899 Other long term (current) drug therapy: Secondary | ICD-10-CM

## 2012-09-17 LAB — COMPREHENSIVE METABOLIC PANEL
ALT: 45 U/L (ref 0–53)
AST: 61 U/L — ABNORMAL HIGH (ref 0–37)
Albumin: 3.9 g/dL (ref 3.5–5.2)
Alkaline Phosphatase: 101 U/L (ref 39–117)
BUN: 21 mg/dL (ref 6–23)
CO2: 24 mEq/L (ref 19–32)
Calcium: 9.4 mg/dL (ref 8.4–10.5)
Chloride: 99 mEq/L (ref 96–112)
Creatinine, Ser: 0.92 mg/dL (ref 0.50–1.35)
GFR calc Af Amer: >90 mL/min (ref >90)
GFR calc non Af Amer: 85 mL/min — ABNORMAL LOW (ref >90)
Glucose, Bld: 124 mg/dL — ABNORMAL HIGH (ref 70–99)
Potassium: 3.5 mEq/L (ref 3.5–5.1)
Sodium: 138 mEq/L (ref 135–145)
Total Bilirubin: 0.3 mg/dL (ref 0.3–1.2)
Total Protein: 8.3 g/dL (ref 6.0–8.3)

## 2012-09-17 LAB — TROPONIN I: Troponin I: <0.3 ng/mL (ref <0.30)

## 2012-09-17 LAB — URINALYSIS, ROUTINE W REFLEX MICROSCOPIC
Bilirubin Urine: NEGATIVE
Glucose, UA: NEGATIVE mg/dL
Hgb urine dipstick: NEGATIVE
Ketones, ur: NEGATIVE mg/dL
Leukocytes, UA: NEGATIVE
Nitrite: NEGATIVE
Protein, ur: NEGATIVE mg/dL
Specific Gravity, Urine: 1.016 (ref 1.005–1.030)
Urobilinogen, UA: 0.2 mg/dL (ref 0.0–1.0)
pH: 6 (ref 5.0–8.0)

## 2012-09-17 LAB — CBC WITH DIFFERENTIAL/PLATELET
Basophils Absolute: 0 10*3/uL (ref 0.0–0.1)
Basophils Relative: 0 % (ref 0–1)
Eosinophils Absolute: 0.1 10*3/uL (ref 0.0–0.7)
Eosinophils Relative: 1 % (ref 0–5)
HCT: 38.6 % — ABNORMAL LOW (ref 39.0–52.0)
Hemoglobin: 12.9 g/dL — ABNORMAL LOW (ref 13.0–17.0)
Lymphocytes Relative: 14 % (ref 12–46)
Lymphs Abs: 1.3 10*3/uL (ref 0.7–4.0)
MCH: 26.9 pg (ref 26.0–34.0)
MCHC: 33.4 g/dL (ref 30.0–36.0)
MCV: 80.6 fL (ref 78.0–100.0)
Monocytes Absolute: 1.1 10*3/uL — ABNORMAL HIGH (ref 0.1–1.0)
Monocytes Relative: 11 % (ref 3–12)
Neutro Abs: 7.2 10*3/uL (ref 1.7–7.7)
Neutrophils Relative %: 74 % (ref 43–77)
Platelets: 171 10*3/uL (ref 150–400)
RBC: 4.79 MIL/uL (ref 4.22–5.81)
RDW: 14.5 % (ref 11.5–15.5)
WBC: 9.7 10*3/uL (ref 4.0–10.5)

## 2012-09-17 LAB — LACTIC ACID, PLASMA: Lactic Acid, Venous: 4.6 mmol/L — ABNORMAL HIGH (ref 0.5–2.2)

## 2012-09-17 LAB — PHENYTOIN LEVEL, TOTAL: Phenytoin Lvl: 7.6 ug/mL — ABNORMAL LOW (ref 10.0–20.0)

## 2012-09-17 LAB — GLUCOSE, CAPILLARY: Glucose-Capillary: 108 mg/dL — ABNORMAL HIGH (ref 70–99)

## 2012-09-17 MED ORDER — ONDANSETRON HCL 4 MG PO TABS: 4.0000 mg | ORAL_TABLET | Freq: Four times a day (QID) | ORAL | Status: DC | PRN

## 2012-09-17 MED ORDER — SODIUM CHLORIDE 0.9 % IV SOLN
INTRAVENOUS | Status: DC
  Administered 2012-09-18 – 2012-09-19 (×3): via INTRAVENOUS

## 2012-09-17 MED ORDER — ALUM & MAG HYDROXIDE-SIMETH 200-200-20 MG/5ML PO SUSP: 30.0000 mL | Freq: Four times a day (QID) | ORAL | Status: DC | PRN

## 2012-09-17 MED ORDER — HYDROMORPHONE HCL PF 1 MG/ML IJ SOLN
1.0000 mg | INTRAMUSCULAR | Status: DC | PRN
  Administered 2012-09-22: 1 mg via INTRAVENOUS
  Filled 2012-09-17: qty 1

## 2012-09-17 MED ORDER — FINASTERIDE 5 MG PO TABS
5.0000 mg | ORAL_TABLET | Freq: Every day | ORAL | Status: DC
  Administered 2012-09-18 – 2012-09-24 (×7): 5 mg via ORAL
  Filled 2012-09-17 (×7): qty 1

## 2012-09-17 MED ORDER — ASPIRIN 81 MG PO CHEW
81.0000 mg | CHEWABLE_TABLET | Freq: Every day | ORAL | Status: DC
  Administered 2012-09-18 – 2012-09-24 (×7): 81 mg via ORAL
  Filled 2012-09-17 (×7): qty 1

## 2012-09-17 MED ORDER — SODIUM CHLORIDE 0.9 % IV SOLN
INTRAVENOUS | Status: DC
  Administered 2012-09-17: 23:00:00 via INTRAVENOUS

## 2012-09-17 MED ORDER — PANTOPRAZOLE SODIUM 40 MG PO TBEC
40.0000 mg | DELAYED_RELEASE_TABLET | Freq: Every day | ORAL | Status: DC
  Administered 2012-09-18 – 2012-09-24 (×7): 40 mg via ORAL
  Filled 2012-09-17 (×6): qty 1

## 2012-09-17 MED ORDER — MIDAZOLAM HCL 2 MG/2ML IJ SOLN
INTRAMUSCULAR | Status: AC
  Administered 2012-09-17: 1 mg via INTRAVENOUS
  Filled 2012-09-17: qty 2

## 2012-09-17 MED ORDER — LATANOPROST 0.005 % OP SOLN
1.0000 [drp] | Freq: Every day | OPHTHALMIC | Status: DC
  Administered 2012-09-18 – 2012-09-23 (×7): 1 [drp] via OPHTHALMIC
  Filled 2012-09-17: qty 2.5

## 2012-09-17 MED ORDER — DORZOLAMIDE HCL-TIMOLOL MAL 2-0.5 % OP SOLN
1.0000 [drp] | Freq: Two times a day (BID) | OPHTHALMIC | Status: DC
  Administered 2012-09-18 – 2012-09-24 (×14): 1 [drp] via OPHTHALMIC
  Filled 2012-09-17 (×2): qty 10

## 2012-09-17 MED ORDER — TERAZOSIN HCL 5 MG PO CAPS: 10.0000 mg | ORAL_CAPSULE | Freq: Every day | ORAL | Status: DC

## 2012-09-17 MED ORDER — ENOXAPARIN SODIUM 40 MG/0.4ML ~~LOC~~ SOLN
40.0000 mg | SUBCUTANEOUS | Status: DC
  Administered 2012-09-18 – 2012-09-24 (×7): 40 mg via SUBCUTANEOUS
  Filled 2012-09-17 (×7): qty 0.4

## 2012-09-17 MED ORDER — ADULT MULTIVITAMIN W/MINERALS CH
1.0000 | ORAL_TABLET | Freq: Every day | ORAL | Status: DC
  Administered 2012-09-18 – 2012-09-24 (×7): 1 via ORAL
  Filled 2012-09-17 (×7): qty 1

## 2012-09-17 MED ORDER — POLYVINYL ALCOHOL 1.4 % OP SOLN
1.0000 [drp] | Freq: Every day | OPHTHALMIC | Status: DC
  Administered 2012-09-18 – 2012-09-24 (×30): 1 [drp] via OPHTHALMIC
  Filled 2012-09-17 (×2): qty 15

## 2012-09-17 MED ORDER — MIDAZOLAM HCL 2 MG/2ML IJ SOLN
1.0000 mg | Freq: Once | INTRAMUSCULAR | Status: AC
  Administered 2012-09-17: 1 mg via INTRAVENOUS

## 2012-09-17 MED ORDER — ONDANSETRON HCL 4 MG/2ML IJ SOLN: 4.0000 mg | Freq: Four times a day (QID) | INTRAMUSCULAR | Status: DC | PRN

## 2012-09-17 MED ORDER — ACETAMINOPHEN 325 MG PO TABS
650.0000 mg | ORAL_TABLET | Freq: Four times a day (QID) | ORAL | Status: DC | PRN
  Administered 2012-09-19 – 2012-09-20 (×3): 650 mg via ORAL
  Filled 2012-09-17 (×3): qty 2

## 2012-09-17 MED ORDER — ACETAMINOPHEN 650 MG RE SUPP: 650.0000 mg | Freq: Four times a day (QID) | RECTAL | Status: DC | PRN

## 2012-09-17 MED ORDER — TERAZOSIN HCL 5 MG PO CAPS
10.0000 mg | ORAL_CAPSULE | Freq: Every day | ORAL | Status: DC
  Administered 2012-09-18 – 2012-09-23 (×7): 10 mg via ORAL
  Filled 2012-09-17 (×9): qty 2

## 2012-09-17 MED ORDER — SODIUM CHLORIDE 0.9 % IV SOLN
500.0000 mg | Freq: Two times a day (BID) | INTRAVENOUS | Status: DC
  Administered 2012-09-18 (×2): 500 mg via INTRAVENOUS
  Filled 2012-09-17 (×4): qty 5

## 2012-09-17 MED ORDER — LORAZEPAM 2 MG/ML IJ SOLN
1.0000 mg | Freq: Once | INTRAMUSCULAR | Status: AC
  Administered 2012-09-17: 1 mg via INTRAVENOUS

## 2012-09-17 MED ORDER — METOPROLOL TARTRATE 25 MG PO TABS
25.0000 mg | ORAL_TABLET | Freq: Two times a day (BID) | ORAL | Status: DC
  Administered 2012-09-18 – 2012-09-24 (×14): 25 mg via ORAL
  Filled 2012-09-17 (×15): qty 1

## 2012-09-17 MED ORDER — SODIUM CHLORIDE 0.9 % IV SOLN: INTRAVENOUS | Status: DC

## 2012-09-17 MED ORDER — PHENOBARBITAL SODIUM 65 MG/ML IJ SOLN
65.0000 mg | Freq: Once | INTRAMUSCULAR | Status: AC
  Administered 2012-09-17: 65 mg via INTRAVENOUS
  Filled 2012-09-17: qty 1

## 2012-09-17 MED ORDER — NIFEDIPINE ER OSMOTIC RELEASE 90 MG PO TB24: 90.0000 mg | ORAL_TABLET | Freq: Every day | ORAL | Status: DC

## 2012-09-17 MED ORDER — CARBOXYMETHYLCELLULOSE SODIUM 1 % OP SOLN: 1.0000 [drp] | Freq: Every day | OPHTHALMIC | Status: DC

## 2012-09-17 MED ORDER — SODIUM CHLORIDE 0.9 % IV SOLN
1000.0000 mg | Freq: Once | INTRAVENOUS | Status: AC
  Administered 2012-09-17: 1000 mg via INTRAVENOUS
  Filled 2012-09-17: qty 10

## 2012-09-17 MED ORDER — NIFEDIPINE ER OSMOTIC RELEASE 90 MG PO TB24
90.0000 mg | ORAL_TABLET | Freq: Every day | ORAL | Status: DC
  Administered 2012-09-18 – 2012-09-24 (×7): 90 mg via ORAL
  Filled 2012-09-17 (×7): qty 1

## 2012-09-17 MED ORDER — LORAZEPAM 2 MG/ML IJ SOLN
INTRAMUSCULAR | Status: AC
  Administered 2012-09-17: 1 mg via INTRAVENOUS
  Filled 2012-09-17: qty 1

## 2012-09-17 NOTE — ED Provider Notes (Signed)
History     CSN: [id number]  Arrival date & time 09/17/12  1439   First MD Initiated Contact with Patient 09/17/12 1456      Chief Complaint  Patient presents with  . Altered Mental Status    confusion since yesterday - normally is caox4, conversant; seen at [facility] x3 days ago for seizures (hx of same)     (Consider location/radiation/quality/duration/timing/severity/associated sxs/prior treatment) HPI Comments: Patient brought to the ER by his grandsons. Patient reportedly has a history of seizure disorder and was just admitted to the [facility] for Dilantin toxicity with seizures. He was discharged yesterday. The patient was brought home by his brother who was not present. The grandsons say that the brother told them that he was a little confused, but seemed to be doing okay yesterday. Today the grandsons reports that he is extremely confused, agitated and therefore brought him to the ER for further evaluation. They are unaware of any falls. I do not have a lot more information to provide at this time and the patient is nonresponsive.  Patient is a 67 y.o. male presenting with altered mental status.  Altered Mental Status    Past Medical History  Diagnosis Date  . Hypertension   . BPH (benign prostatic hyperplasia)   . Syncope and collapse 08/07/2012    "w/loss of consciousness; found down; don't know how long he was out" (08/07/2012)  . Heart murmur     "don't exist now" (08/07/2012)  . H/O hiatal hernia   . Seizures     "h/o gran mal & petit" (08/07/2012)  . Stroke 1980's    "mini w/seizure at the same time" (08/07/2012)  . Coronary artery disease   . LBBB (left bundle branch block)     Past Surgical History  Procedure Date  . Coronary artery bypass graft ~ 2010    CABG X1  . Tibia fracture surgery 1960's    RLE    History reviewed. No pertinent family history.  History  Substance Use Topics  . Smoking status: Never Smoker   . Smokeless tobacco: Never Used    . Alcohol Use: No      Review of Systems  Unable to perform ROS Psychiatric/Behavioral: Positive for altered mental status.    Allergies  Codeine  Home Medications   Current Outpatient Rx  Name  Route  Sig  Dispense  Refill  . ACETAMINOPHEN 325 MG PO TABS   Oral   Take 650 mg by mouth every 6 (six) hours as needed. For pain         . GAVISCON PO   Oral   Take 1 tablet by mouth every 6 (six) hours as needed.         . ASPIRIN 81 MG PO CHEW   Oral   Chew 81 mg by mouth daily.         Marland Kitchen CARBOXYMETHYLCELLULOSE SODIUM 1 % OP SOLN   Both Eyes   Place 1 drop into both eyes 5 (five) times daily.         . DORZOLAMIDE HCL-TIMOLOL MAL 22.3-6.8 MG/ML OP SOLN   Both Eyes   Place 1 drop into both eyes 2 (two) times daily.         Marland Kitchen FINASTERIDE 5 MG PO TABS   Oral   Take 5 mg by mouth daily.         Marland Kitchen HYDROCHLOROTHIAZIDE 25 MG PO TABS   Oral   Take 25 mg by  mouth daily.         Marland Kitchen LATANOPROST 0.005 % OP SOLN   Both Eyes   Place 1 drop into both eyes at bedtime.         Marland Kitchen METOPROLOL TARTRATE 25 MG PO TABS   Oral   Take 25 mg by mouth 2 (two) times daily.         . ADULT MULTIVITAMIN W/MINERALS CH   Oral   Take 1 tablet by mouth daily.         Marland Kitchen NIFEDIPINE ER 90 MG PO TB24   Oral   Take 90 mg by mouth daily.         Marland Kitchen OMEPRAZOLE 20 MG PO CPDR   Oral   Take 20 mg by mouth daily.         Marland Kitchen PHENYTOIN SODIUM EXTENDED 100 MG PO CAPS   Oral   Take 200 mg by mouth 2 (two) times daily.         Marland Kitchen POTASSIUM CHLORIDE CRYS ER 20 MEQ PO TBCR   Oral   Take 20 mEq by mouth 2 (two) times daily.         Marland Kitchen TERAZOSIN HCL 10 MG PO CAPS   Oral   Take 10 mg by mouth at bedtime.           BP 140/76  Pulse 98  Resp 20  SpO2 96%  Physical Exam  Constitutional: He appears distressed.  HENT:  Head: Normocephalic.  Eyes: Conjunctivae normal are normal. Pupils are equal, round, and reactive to light.  Neck: Normal range of motion. Neck  supple.  Cardiovascular: Normal rate, regular rhythm and normal heart sounds.   Pulmonary/Chest: Breath sounds normal. He is in respiratory distress.  Abdominal: Soft. He exhibits no distension.  Musculoskeletal: Normal range of motion. He exhibits no edema.  Neurological: He is unresponsive. No cranial nerve deficit. GCS eye subscore is 1. GCS verbal subscore is 1. GCS motor subscore is 5.       Patient agitated, rolling around in bed, straining against restraints to sit up and climb out of bed    ED Course  Procedures (including critical care time)  Labs Reviewed  CBC WITH DIFFERENTIAL - Abnormal; Notable for the following:    Hemoglobin 12.9 (*)     HCT 38.6 (*)     Monocytes Absolute 1.1 (*)     All other components within normal limits  COMPREHENSIVE METABOLIC PANEL - Abnormal; Notable for the following:    Glucose, Bld 124 (*)     AST 61 (*)     GFR calc non Af Amer 85 (*)     All other components within normal limits  LACTIC ACID, PLASMA - Abnormal; Notable for the following:    Lactic Acid, Venous 4.6 (*)     All other components within normal limits  GLUCOSE, CAPILLARY - Abnormal; Notable for the following:    Glucose-Capillary 108 (*)     All other components within normal limits  URINALYSIS, ROUTINE W REFLEX MICROSCOPIC  TROPONIN I  URINE CULTURE  PHENYTOIN LEVEL, FREE   Ct Head Wo Contrast  09/17/2012  *RADIOLOGY REPORT*  Clinical Data: Altered mental status  CT HEAD WITHOUT CONTRAST  Technique:  Contiguous axial images were obtained from the base of the skull through the vertex without contrast.  Comparison: 10/31/2006  Findings: Generalized atrophy.  Chronic right temporal parietal infarct, unchanged.  There is prominent cerebellar atrophy which is unchanged.  No acute  infarct.  Mild chronic microvascular ischemia in the white matter.  Negative for hemorrhage or mass.  Chronic burr hole right parietal bone.  Mild chronic sinusitis.  IMPRESSION: Atrophy and chronic  right temporal parietal infarct.  No acute abnormality.   Original Report Authenticated By: Janeece Riggers, M.D.      Diagnosis: 1. Seizure 2. Mental status change    MDM  Patient presented to the ER with significant mental status changes arrival. Patient was obtunded and agitated. He does have a history of seizure disorder, but his neurologic activity did not appear to be seizure, at least not classic tonic-clonic. He was considered that it might be postictal, but he became very prolonged here in the ER. His workup was unremarkable. I do not find a reason for his mental status changes, but the patient has improved.  I initially considered intubating the patient arrived facility workup, but he seemed to be protecting his airway and did calm down with Ativan and Versed enough to get a CAT scan. I then needed to resuscitate him after the CAT scan was performed and opted to give him phenobarbital, feeling that this would help with sedation and possibly help treat seizures present. Approximately an hour after administration of phenobarbital patient became awake, alert and answering questions. At this time the patient's condition is much improved, it is not clear exactly what occurred earlier. He will require hospitalization for further workup.        Gilda Crease, MD 09/17/12 2200

## 2012-09-17 NOTE — H&P (Signed)
History and Physical       Hospital Admission Note Date: 09/17/2012  Patient name: [name] Medical record number: [id number] Date of birth: 10-27-1945 Age: 67 y.o. Gender: male PCP: [facility] Complaint:  Altered mental status  HPI: Patient is a 67 year old male with history of seizure disorder who was admitted at [facility] in the last week and released yesterday for seizures, he was found to have elevated Dilantin levels. At the time of my encounter patient is still somewhat confused and unable to provide much history. History is based on her family's reports to the RN and EDP, family members have left the room. Patient was apparently normal at that time of release from [facility] yesterday however progressively became confused after arriving home. In the ED patient was noted to be combative and very agitated requiring restraints. He was given 2 mg of Ativan, 2 mg of Versed. CT head was obtained and subsequently patient received phenobarbital 65 mg. After that patient became more alert and awake and responsive. Patient's grandson had reported that 1-2 hours before coming to the [facility] ED patient had a seizure. Also family reported that patient has auras before the seizure and he keeps 'popping' Dilantin. [facility] was consulted for further management  Review of Systems:  Unable to obtain from the patient as he is still quite confused.  Past Medical History: Past Medical History  Diagnosis Date  . Hypertension   . BPH (benign prostatic hyperplasia)   . Syncope and collapse 08/07/2012    "w/loss of consciousness; found down; don't know how long he was out" (08/07/2012)  . Heart murmur     "don't exist now" (08/07/2012)  . H/O hiatal hernia   . Seizures     "h/o gran mal & petit" (08/07/2012)  . Stroke 1980's    "mini w/seizure at the same time" (08/07/2012)  . Coronary artery disease   . LBBB (left  bundle branch block)    Past Surgical History  Procedure Date  . Coronary artery bypass graft ~ 2010    CABG X1  . Tibia fracture surgery 1960's    RLE    Medications: Prior to Admission medications   Medication Sig Start Date End Date Taking? Authorizing Provider  acetaminophen (TYLENOL) 325 MG tablet Take 650 mg by mouth every 6 (six) hours as needed. For pain   Yes Historical Provider, MD  Alum Hydroxide-Mag Carbonate (GAVISCON PO) Take 1 tablet by mouth every 6 (six) hours as needed.   Yes Historical Provider, MD  aspirin 81 MG chewable tablet Chew 81 mg by mouth daily.   Yes Historical Provider, MD  carboxymethylcellulose 1 % ophthalmic solution Place 1 drop into both eyes 5 (five) times daily.   Yes Historical Provider, MD  dorzolamide-timolol (COSOPT) 22.3-6.8 MG/ML ophthalmic solution Place 1 drop into both eyes 2 (two) times daily.   Yes Historical Provider, MD  finasteride (PROSCAR) 5 MG tablet Take 5 mg by mouth daily.   Yes Historical Provider, MD  hydrochlorothiazide (HYDRODIURIL) 25 MG tablet Take 25 mg by mouth daily.   Yes Historical Provider, MD  latanoprost (XALATAN) 0.005 % ophthalmic solution Place 1 drop into both eyes at bedtime.   Yes Historical Provider, MD  metoprolol tartrate (LOPRESSOR) 25 MG tablet Take 25 mg by mouth 2 (two) times daily.   Yes Historical Provider, MD  Multiple Vitamin (MULTIVITAMIN WITH MINERALS) TABS Take 1 tablet by mouth daily.   Yes Historical Provider, MD  NIFEdipine (ADALAT CC) 90 MG 24 hr tablet Take 90 mg by mouth daily.   Yes Historical Provider, MD  omeprazole (PRILOSEC) 20 MG capsule Take 20 mg by mouth daily.   Yes Historical Provider, MD  phenytoin (DILANTIN) 100 MG ER capsule Take 200 mg by mouth 2 (two) times daily.   Yes Historical Provider, MD  potassium chloride SA (K-DUR,KLOR-CON) 20 MEQ tablet Take 20 mEq by mouth 2 (two) times daily.   Yes Historical Provider, MD  terazosin (HYTRIN) 10 MG capsule Take 10 mg by mouth at  bedtime.   Yes Historical Provider, MD    Allergies:   Allergies  Allergen Reactions  . Codeine Rash    Social History:  reports that he has never smoked. He has never used smokeless tobacco. He reports that he does not drink alcohol or use illicit drugs. patient says that he lives at home alone  Family History: History reviewed. No pertinent family history.  Physical Exam: Blood pressure 167/90, pulse 85, resp. rate 16, SpO2 100.00%. General: Alert, awake, oriented x2 (to date and person), in no acute distress. HEENT: normocephalic, atraumatic, anicteric sclera, pink conjunctiva, pupils equal and reactive to light and accomodation, oropharynx clear Neck: supple, no masses or lymphadenopathy, no goiter, no bruits  Heart: Regular rate and rhythm, without murmurs, rubs or gallops. Lungs: Clear to auscultation bilaterally, no wheezing, rales or rhonchi. Abdomen: Soft, nontender, nondistended, positive bowel sounds, no masses. Extremities: No clubbing, cyanosis or edema with positive pedal pulses. He has deformity of right ankle and leg from previous injury Neuro: Grossly intact, no focal neurological deficits, strength 5/5 upper and lower extremities bilaterally Psych: alert and oriented x 2, confused  Skin: no rashes or lesions, warm and dry   LABS on Admission:  Basic Metabolic Panel:  Lab 09/17/12 1610  NA 138  K 3.5  CL 99  CO2 24  GLUCOSE 124*  BUN 21  CREATININE 0.92  CALCIUM 9.4  MG --  PHOS --   Liver Function Tests:  Lab 09/17/12 1606  AST 61*  ALT 45  ALKPHOS 101  BILITOT 0.3  PROT 8.3  ALBUMIN 3.9   CBC:  Lab 09/17/12 1606  WBC 9.7  NEUTROABS 7.2  HGB 12.9*  HCT 38.6*  MCV 80.6  PLT 171   Cardiac Enzymes:  Lab 09/17/12 1624  CKTOTAL --  CKMB --  CKMBINDEX --  TROPONINI <0.30   BNP: No components found with this basename: POCBNP:2 CBG:  Lab 09/17/12 1515  GLUCAP 108*     Radiological Exams on Admission: Ct Head Wo  Contrast  09/17/2012  *RADIOLOGY REPORT*  Clinical Data: Altered mental status  CT HEAD WITHOUT CONTRAST  Technique:  Contiguous axial images were obtained from the base of the skull through the vertex without contrast.  Comparison: 10/31/2006  Findings: Generalized atrophy.  Chronic right temporal parietal infarct, unchanged.  There is prominent cerebellar atrophy which is unchanged.  No acute infarct.  Mild chronic microvascular ischemia in the white matter.  Negative for hemorrhage or mass.  Chronic burr hole right parietal bone.  Mild chronic sinusitis.  IMPRESSION: Atrophy and chronic right temporal parietal infarct.  No acute abnormality.   Original Report Authenticated By: Janeece Riggers, M.D.     Assessment/Plan Principal Problem:  *Seizure with history of seizure disorder - Admit to step down for closer monitoring overnight as patient had a seizure tried to ED arrival and required Versed, phenobarbital and Ativan to control combativeness - Neurochecks every 4 hours,  EEG, MRI brain - I discussed in detail with Dr. Amada Jupiter (neurology on-call) who is aware of the patient and recommended to start on Keppra loading dose 1gm, followed by 500 mg q12hours - obtain stat Dilantin level   Active Problems:  Hypertension: Continue home medications   BPH (benign prostatic hyperplasia): Continue finasteride   Lactic acidosis: Likely due to acute seizure, placed on IV fluids  DVT prophylaxis: Lovenox  CODE STATUS: Assumed Full code  Further plan will depend as patient's clinical course evolves and further radiologic and laboratory data become available.   Time Spent on Admission: 1 hour  Shalaunda Weatherholtz M.D. [facility] 09/17/2012, 10:19 PM Pager: [phone]  If 7PM-7AM, please contact night-coverage [email or web address] Password [email or web address]

## 2012-09-17 NOTE — ED Notes (Signed)
Pt calming interm; however, will attempt to get out of bed frequently; pt can be redirected but with much difficulty; family in at bedside

## 2012-09-17 NOTE — ED Notes (Signed)
Seen at [facility] hospital 09/14/12 for seizures - found to have elevated dilantin levels - was admitted - d/c'd to home yesterday; brother states was caox4 when arriving home; however, noticed gradual progression of confusion during day; upon arrival to ED, pt is alert, nonverbal, moans interm.; frequently attempting to get out of bed

## 2012-09-17 NOTE — ED Notes (Signed)
Patient transported from CT 

## 2012-09-17 NOTE — ED Notes (Signed)
In to reassess; pt now awake, alert, conversant; however, is still confused; is calm, cooperative; remains in bed on on will; requesting something to "eat and drink"; EDP in to reassess as well - pt informed of plans for admission - no response from pt

## 2012-09-17 NOTE — ED Notes (Signed)
Pt now awake, alert, oriented to person and time; able to tolerate PO fluids; resting quietly on stretfcher, calm, cooperative; hospitalist in to assess

## 2012-09-17 NOTE — Consult Note (Signed)
Reason for Consult: Altered mental status Referring Physician: Rai, Ripudeep  CC: Altered Mental status  History is obtained from:PAtient, medical record, referring provider  HPI: [name] is a 67 y.o. male who was recently discharged from the [place] for where he was found to have dilantin toxicity. He became confused at hoem and then was seen to have a seizure. In the ER< he was combative and confused, given multiple doses of ativan then 65mg  of phenobarbital with much improvement in mental status. He takes dilantin frequently PRN when he has an aura. He currently is drowsy, but cooperative(per nursing dramatic improvement from arrival).    ROS: A 14 point ROS was performed and is negative except as noted in the HPI.  Past Medical History  Diagnosis Date  . Hypertension   . BPH (benign prostatic hyperplasia)   . Syncope and collapse 08/07/2012    "w/loss of consciousness; found down; don't know how long he was out" (08/07/2012)  . Heart murmur     "don't exist now" (08/07/2012)  . H/O hiatal hernia   . Seizures     "h/o gran mal & petit" (08/07/2012)  . Stroke 1980's    "mini w/seizure at the same time" (08/07/2012)  . Coronary artery disease   . LBBB (left bundle branch block)     Family History: No seizures  Social History: Tob: denies  Exam: Current vital signs: BP 156/68  Pulse 78  Temp 98.1 F (36.7 C) (Axillary)  Resp 19  Ht 5\' 6"  (1.676 m)  Wt 71.9 kg (158 lb 8.2 oz)  BMI 25.58 kg/m2  SpO2 100% Vital signs in last 24 hours: Temp:  [98.1 F (36.7 C)-100.2 F (37.9 C)] 98.1 F (36.7 C) (01/13 2308) Pulse Rate:  [76-109] 78  (01/13 2308) Resp:  [16-20] 19  (01/13 2308) BP: (126-167)/(61-90) 156/68 mmHg (01/13 2308) SpO2:  [96 %-100 %] 100 % (01/13 2308) Weight:  [71.9 kg (158 lb 8.2 oz)] 71.9 kg (158 lb 8.2 oz) (01/13 2308)  General: in bed, NAD CV: RRR Mental Status: Patient is awake, alert, oriented to person, place, month, year, and  situation. Immediate and remote memory are intact. Able to spell world backwards without difficulty No signs of aphasia Cranial Nerves: II: Visual Fields are full. Pupils are equal, round, and reactive to light.  Discs are difficult to visualize. III,IV, VI: EOMI without ptosis or diploplia.  V: Facial sensation is symmetric to temperature VII: Facial movement is symmetric.  VIII: hearing is intact to voice X: Uvula elevates symmetrically XI: Shoulder shrug is symmetric. XII: tongue is midline without atrophy or fasciculations.  Motor: Tone is normal. Bulk is normal. 5/5 strength was present in all four extremities.  Sensory: Sensation is symmetric to light touch in the arms and legs. Deep Tendon Reflexes: 2+ and symmetric in the biceps and patellae.  Cerebellar: FNF  intact on right, no ataxia on left, but does miss my finger, though it is not clear why.  Gait: Did not assess due to multiple monitors in the ER setting.    I have reviewed labs in epic and the results pertinent to this consultation are: Cmp, cbc unremarkable Phenytoin level subtheraputic  I have reviewed the images obtained: no acute change  Impression: 67 yo M with confusion in the setting of seizure and subtheraputic dilantin level. This most likely represents a post ictal agitation, though perisistent seizure activity on arrival is difficult to rule out His improvement and appropraite exam at this  point would rule out continued ictal activity. He has had multiple breakthrough seizures and toxicity on dilantin, and at this time, I feel that it is no longer a good choice for him.   Recommendations: 1) Start keppra, 1 gm load, then 500mg  BID 2) If not completely back to baseline by tomorrow, consider MRI and EEG.    Ritta Slot, MD [facility] [phone]  If 7pm- 7am, please page neurology on call at [phone].

## 2012-09-17 NOTE — ED Notes (Signed)
Pt remains combative; becoming increasingly more agitated; will now communicate verbally however, remains extremely confused; grandsons x2 remains at bedside

## 2012-09-17 NOTE — ED Notes (Signed)
Patient transported to CT 

## 2012-09-17 NOTE — ED Notes (Signed)
Neuro in to assess

## 2012-09-18 ENCOUNTER — Inpatient Hospital Stay (HOSPITAL_COMMUNITY): Payer: Medicare Other

## 2012-09-18 ENCOUNTER — Observation Stay (HOSPITAL_COMMUNITY): Payer: Medicare Other

## 2012-09-18 DIAGNOSIS — S2239XA Fracture of one rib, unspecified side, initial encounter for closed fracture: Secondary | ICD-10-CM | POA: Diagnosis present

## 2012-09-18 DIAGNOSIS — G9340 Encephalopathy, unspecified: Secondary | ICD-10-CM | POA: Diagnosis present

## 2012-09-18 HISTORY — DX: Fracture of one rib, unspecified side, initial encounter for closed fracture: S22.39XA

## 2012-09-18 LAB — URINE CULTURE
Colony Count: NO GROWTH
Culture: NO GROWTH

## 2012-09-18 LAB — CBC
HCT: 33.4 % — ABNORMAL LOW (ref 39.0–52.0)
Platelets: 143 10*3/uL — ABNORMAL LOW (ref 150–400)
RBC: 4.16 MIL/uL — ABNORMAL LOW (ref 4.22–5.81)
RDW: 14.6 % (ref 11.5–15.5)
WBC: 5.8 10*3/uL (ref 4.0–10.5)

## 2012-09-18 LAB — BASIC METABOLIC PANEL
CO2: 22 mEq/L (ref 19–32)
Calcium: 8.5 mg/dL (ref 8.4–10.5)
Chloride: 104 mEq/L (ref 96–112)
Creatinine, Ser: 0.75 mg/dL (ref 0.50–1.35)
GFR calc Af Amer: >90 mL/min (ref >90)
Sodium: 139 mEq/L (ref 135–145)

## 2012-09-18 MED ORDER — IBUPROFEN 400 MG PO TABS
400.0000 mg | ORAL_TABLET | ORAL | Status: DC | PRN
  Filled 2012-09-18: qty 2

## 2012-09-18 MED ORDER — HYDROCODONE-ACETAMINOPHEN 5-325 MG PO TABS
1.0000 | ORAL_TABLET | ORAL | Status: DC | PRN
  Administered 2012-09-18 – 2012-09-23 (×7): 1 via ORAL
  Filled 2012-09-18 (×8): qty 1

## 2012-09-18 MED ORDER — POTASSIUM CHLORIDE CRYS ER 20 MEQ PO TBCR
40.0000 meq | EXTENDED_RELEASE_TABLET | Freq: Once | ORAL | Status: AC
  Administered 2012-09-18: 40 meq via ORAL
  Filled 2012-09-18: qty 2

## 2012-09-18 NOTE — Progress Notes (Signed)
EEG completed.

## 2012-09-18 NOTE — Progress Notes (Signed)
[facility] Progress Note [place] TEAM 1 - Stepdown/ICU TEAM   ITAMAR MCGOWAN OZH:[id number] DOB: October 13, 1945 DOA: 09/17/2012 PCP: Default, Provider, MD  Brief narrative: 67 year old male patient with known seizure disorder recently admitted to the [facility] for Dilantin toxicity. He was just released on 09/16/2012. Apparently at time of release from the hospital he was normal but became presently confused after arriving home. After he arrived to the emergency department he was combative and agitated and requiring restraints. He was given 2 mg of Ativan as well as 2 mg of Versed and a CT of the head was accomplished. In addition he was given 65 mg of phenobarbital and he became more alert awake and responsive and it was suspected that he may have had underlying seizure activity upon arrival. The patient's grandson later confirmed at 1-2 hours before presenting to the emergency department the patient had a witnessed seizure. Apparently this patient has auras before seizures in the family reports he keeps popping Dilantin-this likely explains his recent Dilantin toxicity. He was subsequently admitted to the step down unit  Assessment/Plan: Principal Problem:  *Seizures with known seizure disorder -Has had no further seizure activity since arrival -Dilantin level was subtherapeutic at presentation -Appreciate neurology assistance; has been started on Keppra this admission and neurologist recommends utilizing this instead of Dilantin (see below) -MRI this admission is unrevealing for any acute intracranial abnormality  Active Problems:  Encephalopathy -MRI this admission did reveal extensive chronic right temporal lobe encephalomalacia less pronounced right parietal and occipital lobe encephalomalacia. Radiologist advises cerebellar volume loss may relate to chronic Dilantin therapy-neurologist recommends permanent discontinuation of Dilantin -Patient appears to have acute  encephalopathy precipitated by recurrent seizure activity some of which appears to not have been tonic-clonic at times and likely only picked up on an EEG -Last PT and OT to evaluate this patient   Hypokalemia -Oral replete   Lactic acidosis -Lactic acid was 4.6 at presentation with a normal BUN and creatinine and could be related to dehydration so will repeat in a.m. -Continue IV fluids  Left side chest pain -Tender over left lateral rib cage and evidence of old bruising - rib fracture noted on cxr- maintain pain control- start IS to prevent a pneumonia.    Hypertension -Continue metoprolol   LBBB (left bundle branch block)   BPH (benign prostatic hyperplasia)    DVT prophylaxis: Lovenox Code Status: Full Family Communication: Extensive discussion with patient's grandson and other family members at the bedside today regarding rationale for discontinuing Dilantin in favor of Keppra in relation to better control of seizure activity as well as concerns of Dilantin-related encephalomalacia and cerebellar volume loss related to chronic Dilantin therapy. Disposition Plan: Transfer to neurology floor with camera bed-family would like patient transferred back to [facility] in [facility]  Consultants: Neurology  Procedures: None  Antibiotics: None  HPI/Subjective: Patient somewhat lethargic and difficult to arouse but finally awakened. 30 minutes prior had EEG and lunch without difficulty. No complaints verbalized except on exam when we noted tenderness over left lateral rib cage.   Objective: Blood pressure 110/91, pulse 61, temperature 98.1 F (36.7 C), temperature source Oral, resp. rate 22, height 5\' 6"  (1.676 m), weight 71.9 kg (158 lb 8.2 oz), SpO2 97.00%.  Intake/Output Summary (Last 24 hours) at 09/18/12 1329 Last data filed at 09/18/12 0700  Gross per 24 hour  Intake 591.25 ml  Output    200 ml  Net 391.25 ml     Exam: General:  No acute respiratory distress Lungs:  Clear to auscultation bilaterally without wheezes or crackles Cardiovascular: Regular rate and rhythm without murmur gallop or rub normal S1 and S2 Abdomen: Nontender, nondistended, soft, bowel sounds positive, no rebound, no ascites, no appreciable mass Musculoskeletal: No significant cyanosis, clubbing of bilateral lower extremities; tender over left lateral rib cage with evidence of old small bruise in same area Neurological: With stimulation patient does awaken and talks but speech is difficult to understand and sometimes nonsensical, moves spontaneously with no appreciable focal neurological deficits, appears to be oriented only to name at this time  Data Reviewed: Basic Metabolic Panel:  Lab 09/18/12 1610 09/17/12 1606  NA 139 138  K 3.2* 3.5  CL 104 99  CO2 22 24  GLUCOSE 91 124*  BUN 19 21  CREATININE 0.75 0.92  CALCIUM 8.5 9.4  MG -- --  PHOS -- --   Liver Function Tests:  Lab 09/17/12 1606  AST 61*  ALT 45  ALKPHOS 101  BILITOT 0.3  PROT 8.3  ALBUMIN 3.9   No results found for this basename: LIPASE:5,AMYLASE:5 in the last 168 hours No results found for this basename: AMMONIA:5 in the last 168 hours CBC:  Lab 09/18/12 0623 09/17/12 1606  WBC 5.8 9.7  NEUTROABS -- 7.2  HGB 11.1* 12.9*  HCT 33.4* 38.6*  MCV 80.3 80.6  PLT 143* 171   Cardiac Enzymes:  Lab 09/17/12 1624  CKTOTAL --  CKMB --  CKMBINDEX --  TROPONINI <0.30   BNP (last 3 results) No results found for this basename: PROBNP:3 in the last 8760 hours CBG:  Lab 09/17/12 1515  GLUCAP 108*    Recent Results (from the past 240 hour(s))  MRSA PCR SCREENING     Status: Normal   Collection Time   09/18/12 12:40 AM      Component Value Range Status Comment   MRSA by PCR NEGATIVE  NEGATIVE Final      Studies:  Recent x-ray studies have been reviewed in detail by the Attending Physician  Scheduled Meds:  Reviewed in detail by the Attending Physician   Junious Silk, ANP [facility] Office  [phone] Pager [phone]  On-Call/Text Page:      [email or web address]      password [email or web address]  If 7PM-7AM, please contact night-coverage [email or web address] Password [email or web address] 09/18/2012, 1:29 PM   LOS: 1 day   I have examined the patient, reviewed the chart and start pain medication for rib fractures. I agree with the above note which I have modified.   Calvert Cantor, MD [phone]

## 2012-09-18 NOTE — Progress Notes (Addendum)
NEURO HOSPITALIST PROGRESS NOTE   SUBJECTIVE:                                                                                                                        Doing well, no further seizure activity.  Awake and oriented.   OBJECTIVE:                                                                                                                           Vital signs in last 24 hours: Temp:  [98.1 F (36.7 C)-100.2 F (37.9 C)] 98.3 F (36.8 C) (01/14 0814) Pulse Rate:  [51-109] 61  (01/14 0814) Resp:  [14-22] 22  (01/14 0814) BP: (108-167)/(46-90) 121/59 mmHg (01/14 0814) SpO2:  [94 %-100 %] 97 % (01/14 0814) Weight:  [71.9 kg (158 lb 8.2 oz)] 71.9 kg (158 lb 8.2 oz) (01/13 2308)  Intake/Output from previous day: 01/13 0701 - 01/14 0700 In: 591.3 [I.V.:591.3] Out: 200 [Urine:200] Intake/Output this shift:   Nutritional status: Cardiac  Past Medical History  Diagnosis Date  . Hypertension   . BPH (benign prostatic hyperplasia)   . Syncope and collapse 08/07/2012    "w/loss of consciousness; found down; don't know how long he was out" (08/07/2012)  . Heart murmur     "don't exist now" (08/07/2012)  . H/O hiatal hernia   . Seizures     "h/o gran mal & petit" (08/07/2012)  . Stroke 1980's    "mini w/seizure at the same time" (08/07/2012)  . Coronary artery disease   . LBBB (left bundle branch block)       Neurologic Exam:  Mental Status: Alert, oriented, thought content appropriate.  Speech fluent without evidence of aphasia.  Able to follow 3 step commands without difficulty. Cranial Nerves: II:  Visual fields grossly normal, pupils equal, round, reactive to light and accommodation III,IV, VI: ptosis not present, extra-ocular motions intact bilaterally V,VII: smile symmetric, facial light touch sensation normal bilaterally VIII: hearing normal bilaterally IX,X: gag reflex present XI: bilateral shoulder shrug XII: midline  tongue extension Motor: Right : Upper extremity   5/5    Left:     Upper extremity   5/5  Lower extremity   5/5  Lower extremity   5/5 Tone and bulk:normal tone throughout; no atrophy noted Sensory: Pinprick and light touch intact throughout, bilaterally Deep Tendon Reflexes: 2+ and symmetric throughout Plantars: Right: downgoing   Left: downgoing Cerebellar: normal finger-to-nose,  normal heel-to-shin test  CV: pulses palpable throughout     Lab Results: Lab Results  Component Value Date/Time   CHOL  Value: 170        ATP III CLASSIFICATION:  <200     mg/dL   Desirable  213-086  mg/dL   Borderline High  >=578    mg/dL   High 46/05/6294  2:84 AM   Lipid Panel No results found for this basename: CHOL,TRIG,HDL,CHOLHDL,VLDL,LDLCALC in the last 72 hours  Studies/Results: Ct Head Wo Contrast  09/17/2012  *RADIOLOGY REPORT*  Clinical Data: Altered mental status  CT HEAD WITHOUT CONTRAST  Technique:  Contiguous axial images were obtained from the base of the skull through the vertex without contrast.  Comparison: 10/31/2006  Findings: Generalized atrophy.  Chronic right temporal parietal infarct, unchanged.  There is prominent cerebellar atrophy which is unchanged.  No acute infarct.  Mild chronic microvascular ischemia in the white matter.  Negative for hemorrhage or mass.  Chronic burr hole right parietal bone.  Mild chronic sinusitis.  IMPRESSION: Atrophy and chronic right temporal parietal infarct.  No acute abnormality.   Original Report Authenticated By: Janeece Riggers, M.D.    Mr Brain Wo Contrast  09/18/2012  *RADIOLOGY REPORT*  Clinical Data: 67 year old male with altered mental status. Seizure disorder.  Combative.  Elevated Dilantin level.  MRI HEAD WITHOUT CONTRAST  Technique:  Multiplanar, multiecho pulse sequences of the brain and surrounding structures were obtained according to standard protocol without intravenous contrast.  Comparison: 09/17/2012, 10/31/2006.  Findings: Study is  intermittently degraded by motion artifact despite repeated imaging attempts.  Chronic encephalomalacia throughout much of the right temporal lobe and also involving the right parietal lobe.  Encephalomalacia versus volume loss in the right occipital pole.  Ex vacuo enlargement of the right lateral ventricle, especially the atrium and temporal horn.  No restricted diffusion to suggest acute infarction.  No midline shift, mass effect, evidence of mass lesion, ventriculomegaly, extra-axial collection or acute intracranial hemorrhage. Cervicomedullary junction and pituitary are within normal limits. Major intracranial vascular flow voids are grossly preserved. Additional scattered periventricular and subcortical white matter T2 and FLAIR hyperintensity, more so the left hemisphere.  Brain stem within normal limits.  Cerebellar volume loss.  Negative visualized cervical spine. Visualized bone marrow signal is within normal limits.  Grossly normal orbit soft tissues.  Mild paranasal sinus mucosal thickening.  Mastoids are clear.  Grossly negative scalp soft tissues.  IMPRESSION: 1.  Motion degraded exam despite repeated imaging attempts. No acute intracranial abnormality. 2.  Extensive chronic right temporal lobe encephalomalacia.  Less pronounced right parietal and occipital lobe encephalomalacia/volume loss. 3.  Cerebellar volume loss which may relate to chronic Dilantin therapy.   Original Report Authenticated By: Erskine Speed, M.D.     MEDICATIONS  Scheduled:   . aspirin  81 mg Oral Daily  . dorzolamide-timolol  1 drop Both Eyes BID  . enoxaparin (LOVENOX) injection  40 mg Subcutaneous Q24H  . finasteride  5 mg Oral Daily  . latanoprost  1 drop Both Eyes QHS  . levetiracetam  500 mg Intravenous Q12H  . metoprolol tartrate  25 mg Oral BID  . multivitamin with minerals  1 tablet Oral Daily    . NIFEdipine  90 mg Oral Daily  . pantoprazole  40 mg Oral Daily  . polyvinyl alcohol  1 drop Both Eyes 5 X Daily  . terazosin  10 mg Oral QHS    ASSESSMENT/PLAN:                                                                                                               Patient Active Hospital Problem List: Seizure (09/17/2012)   Assessment: No further seizure activity.  Doing well on Keppra 500 mg BID.    Plan: Continue Keppra 500 mg BID IV and change to PO when capable.    Neuro S/O  Felicie Morn PA-C [facility] [phone]  09/18/2012, 10:35 AM

## 2012-09-18 NOTE — Progress Notes (Signed)
PT Cancellation Note  Patient Details Name: [name] MRN: [id number] DOB: 08/26/1946   Cancelled Treatment:    Reason Eval/Treat Not Completed: Patient at procedure or test/unavailable (Attempted to see pt x 2.  Pt in procedure both times.)  Will plan to perform evaluation tomorrow.   Jamine Highfill 09/18/2012, 2:47 PM Jake Shark, PT DPT [phone]

## 2012-09-18 NOTE — Progress Notes (Signed)
Patients meds was given to his grandson-Mr. Olegario Shearer

## 2012-09-18 NOTE — Progress Notes (Signed)
PT Cancellation Note  Patient Details Name: [name] MRN: [id number] DOB: [birth year]   Cancelled Treatment:    Reason Eval/Treat Not Completed: Patient not medically ready (Currently on bedrest).  Please update activity orders when appropriate.   Chen Holzman 09/18/2012, 7:26 AM Jake Shark, PT DPT [phone]

## 2012-09-19 ENCOUNTER — Inpatient Hospital Stay (HOSPITAL_COMMUNITY): Payer: Medicare Other

## 2012-09-19 DIAGNOSIS — R569 Unspecified convulsions: Secondary | ICD-10-CM

## 2012-09-19 LAB — URINALYSIS, ROUTINE W REFLEX MICROSCOPIC
Glucose, UA: NEGATIVE mg/dL
Nitrite: NEGATIVE
Specific Gravity, Urine: 1.016 (ref 1.005–1.030)
pH: 6 (ref 5.0–8.0)

## 2012-09-19 LAB — URINE MICROSCOPIC-ADD ON

## 2012-09-19 LAB — LACTIC ACID, PLASMA: Lactic Acid, Venous: 0.7 mmol/L (ref 0.5–2.2)

## 2012-09-19 MED ORDER — LEVETIRACETAM 500 MG PO TABS
500.0000 mg | ORAL_TABLET | Freq: Two times a day (BID) | ORAL | Status: DC
  Administered 2012-09-19 – 2012-09-24 (×11): 500 mg via ORAL
  Filled 2012-09-19 (×12): qty 1

## 2012-09-19 MED ORDER — PIPERACILLIN-TAZOBACTAM 3.375 G IVPB
3.3750 g | Freq: Three times a day (TID) | INTRAVENOUS | Status: DC
  Administered 2012-09-19 – 2012-09-20 (×3): 3.375 g via INTRAVENOUS
  Filled 2012-09-19 (×5): qty 50

## 2012-09-19 MED ORDER — POTASSIUM CHLORIDE CRYS ER 20 MEQ PO TBCR
40.0000 meq | EXTENDED_RELEASE_TABLET | Freq: Once | ORAL | Status: AC
  Administered 2012-09-19: 40 meq via ORAL
  Filled 2012-09-19: qty 2

## 2012-09-19 NOTE — Progress Notes (Signed)
[facility] PROGRESS NOTE  [name] ZOX:[id number] DOB: [birth year] DOA: 09/17/2012 PCP: Default, Provider, MD  Assessment/Plan: Principal Problem:  *Seizures with known seizure disorder Active Problems:  Hypertension  LBBB (left bundle branch block)  BPH (benign prostatic hyperplasia)  Hypokalemia  Lactic acidosis  Encephalopathy  Rib fracture    1. Seizures with known seizure disorder: Patient has a known history of seizure disorder, for which he was on Dilantin therapy, although compliance is some what suspect. He is s/p recent hospitalization at the [facility], for Dilantin toxicity. He presented on this occasion with recurrent seizures, and a subtherspeutic Dilantin level. Seizure activity was addressed with iv Keppra, with satisfactory clinical response. Neurology consultation was provided by Dr. Ritta Slot. Patient has had no further seizure activity since admission and brain MRI was unrevealing for any acute intracranial abnormality. Patient has been transitioned to PO Keppra, effective 09/19/12.  2. Encephalopathy: Patient appeared to have acute encephalopathy, precipitated by recurrent seizure activity some of which appears to not have been tonic-clonic at times. MRI did reveal extensive chronic right temporal lobe encephalomalacia, less pronounced right parietal and occipital lobe encephalomalacia. Radiologist advises cerebellar volume loss may relate to chronic Dilantin therapy, and neurologist recommends permanent discontinuation of Dilantin. This has been communicated to patient. As of 09/19/12, mental status is back to baseline.  3. Hypokalemia: Repleted as indicated.  4. Lactic acidosis: Lactic acid was 4.6 at presentation with a normal BUN and creatinine and could be related to dehydration, as well as recurrent seizure activity. Managed with iv fluids, and as described above, with resolution. Lactic acid was normal at 0.7 on 09/19/12. IV fluids have been  discontinued.  5. Left side chest pain/Rib fractures: Patient was found to have some tenderness over left lateral rib cage and evidence of old bruising. CXR demonstrated minimally displaced posterior left 11th rib fracture and a possible fracture also of the posterior 10th rib. 6. Hypertension: BP remained well controlled, during this hospitalization. Continue metoprolol  7. LBBB (left bundle branch block: This was incidentally noted on 12-lead EKG and is chronic.  8. BPH (benign prostatic hyperplasia): Stable/Not problematic.   9. Hiatal Hernia/GERD: Asymptomatic.      Code Status: Full Code.  Family Communication:  Disposition Plan: To be determined. Stable for discharge today, pending PT/OT evaluation and recommendations.    Brief narrative: 67 year old male with history of HTN, BPH, Hiatal hernia, CAD s/p CABG, Chronic LBBB, previous CVA, seizure disorder who was admitted at [facility] in the last week and released on 09/16/12.  for seizures, he was found to have elevated Dilantin levels. Patient was apparently normal at that time of release from [facility] on 09/16/12, however progressively became confused after arriving home. In the ED patient was noted to be combative and very agitated requiring restraints. He was given 2 mg of Ativan, 2 mg of Versed. CT head was obtained and subsequently patient received phenobarbital 65 mg. After that patient became more alert and awake and responsive. Patient's grandson had reported that 1-2 hours before coming to the [facility] ED patient had a seizure. Also family reported that patient has auras before the seizure and he keeps 'popping' Dilantin. Patient was admitted for for further management.   Consultants:  Dr Ritta Slot, neurologist.   Procedures:  Head CT scan.  Brain MRI.   CXR.   Antibiotics:  N/A.   HPI/Subjective: No new issues.   Objective: Vital signs in last 24 hours: Temp:  [97.7 F (36.5  C)-98.4 F (36.9 C)]  98.4 F (36.9 C) (01/15 0525) Pulse Rate:  [59-80] 59  (01/15 0525) Resp:  [18-22] 20  (01/15 0525) BP: (110-133)/(48-91) 133/71 mmHg (01/15 0525) SpO2:  [99 %-100 %] 99 % (01/15 0525) Weight change:  Last BM Date: 09/18/12  Intake/Output from previous day: 01/14 0701 - 01/15 0700 In: 120 [P.O.:120] Out: 400 [Urine:400]     Physical Exam: General: Comfortable, alert, communicative, fully oriented, not short of breath at rest.  HEENT:  No clinical pallor, no jaundice, no conjunctival injection or discharge. Hydration is satisfactory.  NECK:  Supple, JVP not seen, no carotid bruits, no palpable lymphadenopathy, no palpable goiter. CHEST:  Clinically clear to auscultation, no wheezes, no crackles. HEART:  Sounds 1 and 2 heard, normal, regular, no murmurs. ABDOMEN:  Full, soft, non-tender, no palpable organomegaly, no palpable masses, normal bowel sounds. GENITALIA:  Not examined. LOWER EXTREMITIES:  No pitting edema, palpable peripheral pulses.  MUSCULOSKELETAL SYSTEM:  Has deformity of right ankle, due to previous injury, otherwise, generalized osteoarthritic changes. CENTRAL NERVOUS SYSTEM:  No focal neurologic deficit on gross examination.  Lab Results:  [facility] 09/18/12 0623 09/17/12 1606  WBC 5.8 9.7  HGB 11.1* 12.9*  HCT 33.4* 38.6*  PLT 143* 171    Basename 09/18/12 0623 09/17/12 1606  NA 139 138  K 3.2* 3.5  CL 104 99  CO2 22 24  GLUCOSE 91 124*  BUN 19 21  CREATININE 0.75 0.92  CALCIUM 8.5 9.4   Recent Results (from the past 240 hour(s))  URINE CULTURE     Status: Normal   Collection Time   09/17/12  7:22 PM      Component Value Range Status Comment   Specimen Description URINE, CATHETERIZED   Final    Special Requests NONE   Final    Culture  Setup Time 09/17/2012 20:34   Final    Colony Count NO GROWTH   Final    Culture NO GROWTH   Final    Report Status 09/18/2012 FINAL   Final   MRSA PCR SCREENING     Status: Normal   Collection Time   09/18/12  12:40 AM      Component Value Range Status Comment   MRSA by PCR NEGATIVE  NEGATIVE Final      Studies/Results: Dg Ribs Unilateral W/chest Left  09/18/2012  *RADIOLOGY REPORT*  Clinical Data: Pain after seizure  LEFT RIBS AND CHEST - 3+ VIEW  Comparison: 08/07/2012  Findings: There has been previous median sternotomy and CABG. Large hiatal hernia again evident.  The patient is rotated towards the right.  There is venous hypertension without frank edema.  Left rib details show old healed rib fractures on the left.  Marker in place in the area of concern posteriorly on the left, overlying a minimally displaced posterior 11th rib fracture. There may also be a fracture of the posterior left tenth rib.  IMPRESSION: Minimally displaced posterior left 11th rib fracture. Possible fracture also of the posterior 10th rib.  Large hiatal hernia.  Question venous hypertension.   Original Report Authenticated By: Paulina Fusi, M.D.    Ct Head Wo Contrast  09/17/2012  *RADIOLOGY REPORT*  Clinical Data: Altered mental status  CT HEAD WITHOUT CONTRAST  Technique:  Contiguous axial images were obtained from the base of the skull through the vertex without contrast.  Comparison: 10/31/2006  Findings: Generalized atrophy.  Chronic right temporal parietal infarct, unchanged.  There is prominent cerebellar atrophy which is unchanged.  No acute infarct.  Mild chronic microvascular ischemia in the white matter.  Negative for hemorrhage or mass.  Chronic burr hole right parietal bone.  Mild chronic sinusitis.  IMPRESSION: Atrophy and chronic right temporal parietal infarct.  No acute abnormality.   Original Report Authenticated By: Janeece Riggers, M.D.    Mr Brain Wo Contrast  09/18/2012  *RADIOLOGY REPORT*  Clinical Data: 67 year old male with altered mental status. Seizure disorder.  Combative.  Elevated Dilantin level.  MRI HEAD WITHOUT CONTRAST  Technique:  Multiplanar, multiecho pulse sequences of the brain and surrounding  structures were obtained according to standard protocol without intravenous contrast.  Comparison: 09/17/2012, 10/31/2006.  Findings: Study is intermittently degraded by motion artifact despite repeated imaging attempts.  Chronic encephalomalacia throughout much of the right temporal lobe and also involving the right parietal lobe.  Encephalomalacia versus volume loss in the right occipital pole.  Ex vacuo enlargement of the right lateral ventricle, especially the atrium and temporal horn.  No restricted diffusion to suggest acute infarction.  No midline shift, mass effect, evidence of mass lesion, ventriculomegaly, extra-axial collection or acute intracranial hemorrhage. Cervicomedullary junction and pituitary are within normal limits. Major intracranial vascular flow voids are grossly preserved. Additional scattered periventricular and subcortical white matter T2 and FLAIR hyperintensity, more so the left hemisphere.  Brain stem within normal limits.  Cerebellar volume loss.  Negative visualized cervical spine. Visualized bone marrow signal is within normal limits.  Grossly normal orbit soft tissues.  Mild paranasal sinus mucosal thickening.  Mastoids are clear.  Grossly negative scalp soft tissues.  IMPRESSION: 1.  Motion degraded exam despite repeated imaging attempts. No acute intracranial abnormality. 2.  Extensive chronic right temporal lobe encephalomalacia.  Less pronounced right parietal and occipital lobe encephalomalacia/volume loss. 3.  Cerebellar volume loss which may relate to chronic Dilantin therapy.   Original Report Authenticated By: Erskine Speed, M.D.     Medications: Scheduled Meds:   . aspirin  81 mg Oral Daily  . dorzolamide-timolol  1 drop Both Eyes BID  . enoxaparin (LOVENOX) injection  40 mg Subcutaneous Q24H  . finasteride  5 mg Oral Daily  . latanoprost  1 drop Both Eyes QHS  . levetiracetam  500 mg Intravenous Q12H  . metoprolol tartrate  25 mg Oral BID  . multivitamin with  minerals  1 tablet Oral Daily  . NIFEdipine  90 mg Oral Daily  . pantoprazole  40 mg Oral Daily  . polyvinyl alcohol  1 drop Both Eyes 5 X Daily  . terazosin  10 mg Oral QHS   Continuous Infusions:   . sodium chloride 75 mL/hr at 09/19/12 0353   PRN Meds:.acetaminophen, acetaminophen, alum & mag hydroxide-simeth, HYDROcodone-acetaminophen, HYDROmorphone (DILAUDID) injection, ibuprofen, ondansetron (ZOFRAN) IV, ondansetron    LOS: 2 days   Lerone Onder,CHRISTOPHER  [facility] Pager [phone]. If 8PM-8AM, please contact night-coverage at [email or web address], password [facility] 09/19/2012, 8:30 AM  LOS: 2 days

## 2012-09-19 NOTE — Progress Notes (Signed)
ANTIBIOTIC CONSULT NOTE - INITIAL  Pharmacy Consult for zosyn Indication: fever of 101  Allergies  Allergen Reactions  . Codeine Rash    Patient Measurements: Height: 5\' 6"  (167.6 cm) Weight: 158 lb 8.2 oz (71.9 kg) IBW/kg (Calculated) : 63.8    Vital Signs: Temp: 101.1 F (38.4 C) (01/15 1443) Temp src: Oral (01/15 1443) BP: 131/60 mmHg (01/15 1443) Pulse Rate: 69  (01/15 1443) Intake/Output from previous day: 01/14 0701 - 01/15 0700 In: 120 [P.O.:120] Out: 400 [Urine:400] Intake/Output from this shift:    Labs:  [facility] 09/18/12 0623 09/17/12 1606  WBC 5.8 9.7  HGB 11.1* 12.9*  PLT 143* 171  LABCREA -- --  CREATININE 0.75 0.92   Estimated Creatinine Clearance: 79.8 ml/min (by C-G formula based on Cr of 0.75). No results found for this basename: VANCOTROUGH:2,VANCOPEAK:2,VANCORANDOM:2,GENTTROUGH:2,GENTPEAK:2,GENTRANDOM:2,TOBRATROUGH:2,TOBRAPEAK:2,TOBRARND:2,AMIKACINPEAK:2,AMIKACINTROU:2,AMIKACIN:2, in the last 72 hours   Microbiology: Recent Results (from the past 720 hour(s))  URINE CULTURE     Status: Normal   Collection Time   09/17/12  7:22 PM      Component Value Range Status Comment   Specimen Description URINE, CATHETERIZED   Final    Special Requests NONE   Final    Culture  Setup Time 09/17/2012 20:34   Final    Colony Count NO GROWTH   Final    Culture NO GROWTH   Final    Report Status 09/18/2012 FINAL   Final   MRSA PCR SCREENING     Status: Normal   Collection Time   09/18/12 12:40 AM      Component Value Range Status Comment   MRSA by PCR NEGATIVE  NEGATIVE Final     Medical History: Past Medical History  Diagnosis Date  . Hypertension   . BPH (benign prostatic hyperplasia)   . Syncope and collapse 08/07/2012    "w/loss of consciousness; found down; don'Waller know how long he was out" (08/07/2012)  . Heart murmur     "don'Waller exist now" (08/07/2012)  . H/O hiatal hernia   . Seizures     "h/o gran mal & petit" (08/07/2012)  . Stroke 1980's    "mini w/seizure at the same time" (08/07/2012)  . Coronary artery disease   . LBBB (left bundle branch block)     Medications:  Prescriptions prior to admission  Medication Sig Dispense Refill  . acetaminophen (TYLENOL) 325 MG tablet Take 650 mg by mouth every 6 (six) hours as needed. For pain      . Alum Hydroxide-Mag Carbonate (GAVISCON PO) Take 1 tablet by mouth every 6 (six) hours as needed.      Marland Kitchen aspirin 81 MG chewable tablet Chew 81 mg by mouth daily.      . carboxymethylcellulose 1 % ophthalmic solution Place 1 drop into both eyes 5 (five) times daily.      . dorzolamide-timolol (COSOPT) 22.3-6.8 MG/ML ophthalmic solution Place 1 drop into both eyes 2 (two) times daily.      . finasteride (PROSCAR) 5 MG tablet Take 5 mg by mouth daily.      . hydrochlorothiazide (HYDRODIURIL) 25 MG tablet Take 25 mg by mouth daily.      Marland Kitchen latanoprost (XALATAN) 0.005 % ophthalmic solution Place 1 drop into both eyes at bedtime.      . metoprolol tartrate (LOPRESSOR) 25 MG tablet Take 25 mg by mouth 2 (two) times daily.      . Multiple Vitamin (MULTIVITAMIN WITH MINERALS) TABS Take 1 tablet by mouth daily.      Marland Kitchen  NIFEdipine (ADALAT CC) 90 MG 24 hr tablet Take 90 mg by mouth daily.      Marland Kitchen omeprazole (PRILOSEC) 20 MG capsule Take 20 mg by mouth daily.      . phenytoin (DILANTIN) 100 MG ER capsule Take 200 mg by mouth 2 (two) times daily.      . potassium chloride SA (K-DUR,KLOR-CON) 20 MEQ tablet Take 20 mEq by mouth 2 (two) times daily.      Marland Kitchen terazosin (HYTRIN) 10 MG capsule Take 10 mg by mouth at bedtime.       Assessment: [name] is a 67 yo man to start empiric zosyn for a temp of 101.1   A septic workup with U/A, CXR and blood cultures has been ordered.  Aspiration PNA may be possible.  His wbc on 1/14 was 5.8. His wt is 72 kg and his creat cl is 80 ml/min  Goal of Therapy:  Eradication of infection  Plan:  Zosyn 3.375 gm IV q8h, infuse each dose over 4 hours. F/u renal function and  culturAe data. Thanks [name], Pharm.D. [phone] 09/19/2012 5:15 PM   [name] 09/19/2012,5:12 PM

## 2012-09-19 NOTE — Progress Notes (Signed)
Comment: Patient was evaluated by PT/OT and SNF recommended. He however, spiked a pyrexia of 101 degrees Farenheit on 09/19/12.  Plan: 1. Septic w/u with u/A and cultures, CXR, blood cultures. 2. Empiric Zosyn, as aspiration may be a possible etiology.  Note: x-Ray of right ankle revealed old fractures only.  C. Myrl Bynum. MD, FACP.

## 2012-09-19 NOTE — Evaluation (Signed)
Occupational Therapy Evaluation Patient Details Name: [name] MRN: [id number] DOB: Jan 23, 1946 Today's Date: 09/19/2012 Time: 7829-5621 OT Time Calculation (min): 40 min  OT Assessment / Plan / Recommendation Clinical Impression  This 67 y.o. male admitted with AMS and subsequent seizure in ED.  Pt. recently released from [facility] for Dilantin toxicity.  Pt. with new Lt. rib fractures and now reports Rt. ankle pain which he reports is new.  Pt. limited with BADLs by pain and dyspnea.  Currently recommend 24 hour assistance at home, and pt. reports he does not have this available; therefore, recommend SNF at discharge.    OT Assessment  Patient needs continued OT Services    Follow Up Recommendations  SNF;Supervision/Assistance - 24 hour    Barriers to Discharge Decreased caregiver support    Equipment Recommendations  None recommended by OT    Recommendations for Other Services    Frequency  Min 2X/week    Precautions / Restrictions Precautions Precautions: Fall Precaution Comments: Pt also with new onset Rt. ankle pain with Marijean Heath - MD notified.  Pt with old war injury Rt. ankle Restrictions Weight Bearing Restrictions: No       ADL  Eating/Feeding: Independent Where Assessed - Eating/Feeding: Chair Grooming: Wash/dry hands;Wash/dry face;Teeth care;Minimal assistance Where Assessed - Grooming: Supported standing Upper Body Bathing: Set up;Supervision/safety Where Assessed - Upper Body Bathing: Unsupported sitting Lower Body Bathing: Minimal assistance Where Assessed - Lower Body Bathing: Supported sit to stand Upper Body Dressing: Set up;Supervision/safety Where Assessed - Upper Body Dressing: Unsupported sitting Lower Body Dressing: Moderate assistance Where Assessed - Lower Body Dressing: Supported sit to Pharmacist, hospital: Minimal assistance Toilet Transfer Method: Sit to Barista: Comfort height toilet Toileting - Clothing  Manipulation and Hygiene: Minimal assistance Where Assessed - Engineer, mining and Hygiene: Standing Equipment Used: Rolling walker Transfers/Ambulation Related to ADLs: Pt ambulates slowly with RW.  Pt limited by Rt. ankle pain and Lt. rib cage pain ADL Comments: Pt. requires min A, increased time, and significant effort to don socks.  Pt. with audible wheeze and dyspnea 3/4 with ADLs and ambulation, but 02 sats 98-100% on RA    OT Diagnosis: Generalized weakness;Acute pain;Cognitive deficits  OT Problem List: Decreased strength;Decreased activity tolerance;Impaired balance (sitting and/or standing);Decreased safety awareness;Decreased knowledge of use of DME or AE;Decreased cognition;Pain OT Treatment Interventions: Self-care/ADL training;DME and/or AE instruction;Therapeutic activities;Cognitive remediation/compensation;Patient/family education;Balance training   OT Goals Acute Rehab OT Goals OT Goal Formulation: With patient Time For Goal Achievement: 10/03/12 Potential to Achieve Goals: Good ADL Goals Pt Will Perform Grooming: with supervision;Standing at sink ADL Goal: Grooming - Progress: Goal set today Pt Will Perform Lower Body Bathing: with supervision;Sit to stand from chair;Sit to stand from bed ADL Goal: Lower Body Bathing - Progress: Goal set today Pt Will Perform Lower Body Dressing: with supervision;Sit to stand from chair;Sit to stand from bed ADL Goal: Lower Body Dressing - Progress: Goal set today Pt Will Transfer to Toilet: with supervision;Ambulation;with DME ADL Goal: Toilet Transfer - Progress: Goal set today Pt Will Perform Toileting - Clothing Manipulation: with supervision;Standing ADL Goal: Toileting - Clothing Manipulation - Progress: Goal set today Pt Will Perform Toileting - Hygiene: with supervision;Standing at 3-in-1/toilet ADL Goal: Toileting - Hygiene - Progress: Goal set today  Visit Information  Last OT Received On: 09/19/12 Assistance  Needed: +1 PT/OT Co-Evaluation/Treatment: Yes    Subjective Data  Subjective: "I'm sore, but other than that, I'm okay" Patient Stated Goal: To get  better   Prior Functioning     Home Living Lives With: Alone Available Help at Discharge: Family;Available PRN/intermittently Type of Home: House Home Access: Stairs to enter [employer] of Steps: 2 Entrance Stairs-Rails: Can reach both;Left;Right Home Layout: One level Bathroom Shower/Tub: Forensic scientist: Standard Home Adaptive Equipment: Straight cane Prior Function Level of Independence: Independent Able to Take Stairs?: Yes Driving: No Vocation: Retired Musician: No difficulties Dominant Hand: Right         Vision/Perception Vision - Assessment Vision Assessment: Vision not tested Perception Perception: Within Functional Limits Praxis Praxis: Intact   Cognition  Overall Cognitive Status: Impaired Area of Impairment: Safety/judgement;Problem solving Arousal/Alertness: Awake/alert Orientation Level: Appears intact for tasks assessed Behavior During Session: [facility] for tasks performed Safety/Judgement: Decreased awareness of need for assistance Cognition - Other Comments: Pt slow to process.  Unsure of pt. baseline.  Am concerned about pt ability to manage medications independently especially given admitting diagnosis and two admissions in the past week    Extremity/Trunk Assessment Right Upper Extremity Assessment RUE ROM/Strength/Tone: Within functional levels RUE Coordination: WFL - gross/fine motor Left Upper Extremity Assessment LUE ROM/Strength/Tone: Within functional levels LUE Coordination: WFL - gross/fine motor Trunk Assessment Trunk Assessment: Normal     Mobility Bed Mobility Bed Mobility: Supine to Sit;Sitting - Scoot to Edge of Bed Supine to Sit: 5: Supervision;With rails Sitting - Scoot to Edge of Bed: 5: Supervision;With  rail Transfers Transfers: Sit to Stand;Stand to Sit Sit to Stand: 4: Min assist;From bed;With upper extremity assist Stand to Sit: 4: Min assist;To chair/3-in-1;With upper extremity assist Details for Transfer Assistance: Pt with a wide BOS and dependent on bed rail for balance when moving sit to stand from bed.  Upon questioning, pt reports pain Rt. ankle.  Pt. ambulated with RW.  Requires cues for walker safety and to turn fully with RW before reaching for recliner     Shoulder Instructions     Exercise     Balance Balance Balance Assessed: Yes Dynamic Sitting Balance Dynamic Sitting - Balance Support: No upper extremity supported Dynamic Sitting - Level of Assistance: 5: Stand by assistance Dynamic Sitting - Balance Activities: Other (comment) (donning socks) Static Standing Balance Static Standing - Balance Support: Bilateral upper extremity supported Static Standing - Level of Assistance: 4: Min assist Static Standing - Comment/# of Minutes: 30 seconds    End of Session OT - End of Session Activity Tolerance: Patient limited by pain Patient left: in chair;with call bell/phone within reach Nurse Communication: Mobility status;Patient requests pain meds  GO     Brizza Nathanson M 09/19/2012, 11:46 AM

## 2012-09-19 NOTE — Procedures (Signed)
EEG NUMBER:  [id number] - [id number].  REFERRING PHYSICIANS:   INDICATION FOR STUDY:  A 67 year old man with history of seizures, admitted with confusion and possible ongoing seizure activity.  DESCRIPTION:  This is a routine EEG recording performed during wakefulness.  Predominant background activity consisted of diffuse low amplitude symmetrical beta activity as well as occasional runs of 9 hertz alpha rhythm recorded from the posterior head regions, which was symmetrical.  Diffuse muscle artifact was also recorded throughout a large portion of this record.  Photic stimulation was not performed. Hyperventilation was not performed.  No epileptiform discharges were recorded.  There were no areas of abnormal slowing.  INTERPRETATION:  This is a normal EEG recording.  No evidence of epileptic activity was recorded.     Noel Christmas, MD    [id number] D:  09/19/2012 11:00:55  T:  09/19/2012 23:33:36  Job #:  [id number]

## 2012-09-19 NOTE — Evaluation (Signed)
Physical Therapy Evaluation Patient Details Name: [name] MRN: [id number] DOB: 07/09/1946 Today's Date: 09/19/2012 Time: 1100-1120 PT Time Calculation (min): 20 min  PT Assessment / Plan / Recommendation Clinical Impression  Pt 67 yo male presenting with + wheezing with all activity depsit Spo2 at 98-100%, R LE pain, and rib pain requiring assist for safe mobility. Patient with 2 hospital admissions in the last week and has inconsistant help at home. Question patients ability to care for self and see patient as a high falls risk. Recommend ST-SNF placement upon d/c to address weakness, decreased activity tolerance, and balance impairments in addition to cognitive deficits. Pt requires 24/7 assist/supervision upon d/c.    PT Assessment  Patient needs continued PT services    Follow Up Recommendations  SNF;Supervision/Assistance - 24 hour    Does the patient have the potential to tolerate intense rehabilitation      Barriers to Discharge Decreased caregiver support      Equipment Recommendations  Rolling walker with 5" wheels    Recommendations for Other Services     Frequency Min 3X/week    Precautions / Restrictions Precautions Precautions: Fall Precaution Comments: pt with new onset of R ankle pain since incident on monday. MD notified. Restrictions Weight Bearing Restrictions: No   Pertinent Vitals/Pain 30/10 rib and R ankle/foot pain      Mobility  Bed Mobility Bed Mobility: Not assessed (pt received sitting EOB) Supine to Sit: 5: Supervision;With rails Sitting - Scoot to Edge of Bed: 5: Supervision;With rail Transfers Transfers: Sit to Stand;Stand to Sit Sit to Stand: 4: Min assist;From bed;With upper extremity assist Stand to Sit: 4: Min assist;To chair/3-in-1;With upper extremity assist Details for Transfer Assistance: pt dependent on UE assist, increased rib pain with transfer, unsteady on feet requiring wide base of  support Ambulation/Gait Ambulation/Gait Assistance: 3: Mod assist Ambulation Distance (Feet): 40 Feet Assistive device: Rolling walker Ambulation/Gait Assistance Details: assist for walker management, especially around bed and to sit in recliner. antalgic gait with R LE limp, increased trunk flexion Gait Pattern: Step-to pattern;Decreased step length - right;Decreased stance time - right;Decreased dorsiflexion - right Gait velocity: decreased General Gait Details: pt unsafe to use RW on own however RW provides increased stability with walking compared to no AD. Pt also with SOB/wheezing with amb SpO2 at 98-100% on RA. Stairs: No Wheelchair Mobility Wheelchair Mobility: No    Shoulder Instructions     Exercises     PT Diagnosis: Difficulty walking;Acute pain  PT Problem List: Decreased strength;Decreased range of motion;Decreased activity tolerance;Decreased balance;Decreased mobility;Decreased safety awareness;Decreased knowledge of use of DME PT Treatment Interventions: Gait training;Stair training;Functional mobility training;Therapeutic activities;Therapeutic exercise;Balance training   PT Goals Acute Rehab PT Goals PT Goal Formulation: With patient Time For Goal Achievement: 10/03/12 Potential to Achieve Goals: Good Pt will go Supine/Side to Sit: with modified independence;with HOB 0 degrees PT Goal: Supine/Side to Sit - Progress: Goal set today Pt will go Sit to Supine/Side: with modified independence;with HOB 0 degrees PT Goal: Sit to Supine/Side - Progress: Goal set today Pt will go Sit to Stand: with supervision;with upper extremity assist (up to RW) PT Goal: Sit to Stand - Progress: Goal set today Pt will go Stand to Sit: with supervision;with upper extremity assist PT Goal: Stand to Sit - Progress: Goal set today Pt will Ambulate: 51 - 150 feet;with supervision;with rolling walker PT Goal: Ambulate - Progress: Goal set today  Visit Information  Last PT Received On:  09/19/12 Assistance Needed: +1  PT/OT Co-Evaluation/Treatment: Yes    Subjective Data  Subjective: Pt received sitting EOB with OT. c/o 30/10 rib pain   Prior Functioning  Home Living Lives With: Alone Available Help at Discharge: Family;Available PRN/intermittently Type of Home: House Home Access: Stairs to enter [employer] of Steps: 2 Entrance Stairs-Rails: Can reach both;Left;Right Home Layout: One level Bathroom Shower/Tub: Forensic scientist: Standard Home Adaptive Equipment: Straight cane Prior Function Level of Independence: Independent Able to Take Stairs?: Yes Driving: No Vocation: Retired Musician: No difficulties Dominant Hand: Right    Cognition  Overall Cognitive Status: Impaired Area of Impairment: Safety/judgement;Problem solving Arousal/Alertness: Awake/alert Orientation Level: Appears intact for tasks assessed Behavior During Session: [facility] for tasks performed Safety/Judgement: Decreased awareness of need for assistance Safety/Judgement - Other Comments: v/c's for safe turning with RW to sit in chair Cognition - Other Comments: pt with delayed processing. unclear of baseline pt cognition. concern for pt abiltiy to care for self : pts with seizure disorder and has had 2 admissions in last week    Extremity/Trunk Assessment Right Upper Extremity Assessment RUE ROM/Strength/Tone: Within functional levels RUE Coordination: WFL - gross/fine motor Left Upper Extremity Assessment LUE ROM/Strength/Tone: Within functional levels LUE Coordination: WFL - gross/fine motor Right Lower Extremity Assessment RLE ROM/Strength/Tone: Deficits;Due to pain RLE ROM/Strength/Tone Deficits: limited R LE WBing, grossly atleast 3/5 except ankle ROM limited due to old war injury and pain Left Lower Extremity Assessment LLE ROM/Strength/Tone: Within functional levels Trunk Assessment Trunk Assessment: Normal   Balance  Balance Balance Assessed: Yes Dynamic Sitting Balance Dynamic Sitting - Balance Support: No upper extremity supported Dynamic Sitting - Level of Assistance: 5: Stand by assistance Dynamic Sitting - Balance Activities: Other (comment) (donning socks) Static Standing Balance Static Standing - Balance Support: Bilateral upper extremity supported Static Standing - Level of Assistance: 4: Min assist Static Standing - Comment/# of Minutes: 30 seconds   End of Session PT - End of Session Equipment Utilized During Treatment: Gait belt Activity Tolerance: Patient limited by pain Patient left: in chair;with call bell/phone within reach Nurse Communication: Mobility status  GP     Marcene Brawn 09/19/2012, 12:07 PM Lewis Shock, PT, DPT Pager #: [phone] Office #: [phone]

## 2012-09-20 MED ORDER — ALBUTEROL SULFATE (5 MG/ML) 0.5% IN NEBU: 2.5000 mg | INHALATION_SOLUTION | Freq: Once | RESPIRATORY_TRACT | Status: DC

## 2012-09-20 MED ORDER — AMPICILLIN 250 MG PO CAPS
500.0000 mg | ORAL_CAPSULE | Freq: Four times a day (QID) | ORAL | Status: DC
  Administered 2012-09-20 – 2012-09-24 (×16): 500 mg via ORAL
  Filled 2012-09-20 (×3): qty 2
  Filled 2012-09-20: qty 1
  Filled 2012-09-20 (×2): qty 2
  Filled 2012-09-20: qty 1
  Filled 2012-09-20: qty 2
  Filled 2012-09-20: qty 1
  Filled 2012-09-20 (×8): qty 2
  Filled 2012-09-20: qty 1
  Filled 2012-09-20 (×8): qty 2
  Filled 2012-09-20: qty 1

## 2012-09-20 MED ORDER — TRAMADOL HCL 50 MG PO TABS
50.0000 mg | ORAL_TABLET | Freq: Three times a day (TID) | ORAL | Status: DC
  Administered 2012-09-20 – 2012-09-24 (×12): 50 mg via ORAL
  Filled 2012-09-20 (×15): qty 1

## 2012-09-20 NOTE — Discharge Summary (Signed)
Physician Discharge Summary  [name] AVW:[id number] DOB: 15-Oct-1945 DOA: 09/17/2012  PCP: Default, Provider, MD  Admit date: 09/17/2012 Discharge date: 09/24/2012  Time spent: 40 minutes  Recommendations for Outpatient Follow-up:  1. Follow up with primary MD. 2. Follow up with SNF MD.  3. Follow up with Dr Toni Arthurs in 1 month, or with primary orthopedic surgeon at [facility].   Discharge Diagnoses:  Principal Problem:  *Seizures with known seizure disorder Active Problems:  Hypertension  LBBB (left bundle branch block)  BPH (benign prostatic hyperplasia)  Hypokalemia  Lactic acidosis  Encephalopathy  Rib fracture  UTI (lower urinary tract infection)   Discharge Condition: Satisfactory.  Diet recommendation: Heart-Healthy.   Filed Weights   09/17/12 2308  Weight: 71.9 kg (158 lb 8.2 oz)    History of present illness:  67 year old male with history of HTN, BPH, Hiatal hernia, CAD s/p CABG, Chronic LBBB, previous CVA, seizure disorder who was admitted at [facility] in the last week and released on 09/16/12. for seizures, he was found to have elevated Dilantin levels. Patient was apparently normal at that time of release from [facility] on 09/16/12, however progressively became confused after arriving home. In the ED patient was noted to be combative and very agitated requiring restraints. He was given 2 mg of Ativan, 2 mg of Versed. CT head was obtained and subsequently patient received phenobarbital 65 mg. After that patient became more alert and awake and responsive. Patient's grandson had reported that 1-2 hours before coming to the [facility] ED patient had a seizure. Also family reported that patient has auras before the seizure and he keeps 'popping' Dilantin. Patient was admitted for for further management.   Hospital Course:  1. Seizures with known seizure disorder: Patient has a known history of seizure disorder, for which he was on Dilantin therapy, although  compliance is some what suspect. He is s/p recent hospitalization at the [facility], for Dilantin toxicity. He presented on this occasion with recurrent seizures, and a subtherapeutic Dilantin level. Seizure activity was addressed with iv Keppra, with satisfactory clinical response. Neurology consultation was provided by Dr. Ritta Slot. Patient had no further seizure activity during the course of his hospitalization and brain MRI was unrevealing for any acute intracranial abnormality. He was transitioned to PO Keppra, effective 09/19/12. EEG was a normal recording, with no evidence of epileptic activity. 2. Encephalopathy: Patient appeared to have an acute encephalopathy, precipitated by recurrent seizure activity some of which appears not to have been tonic-clonic at times. MRI did reveal extensive chronic right temporal lobe encephalomalacia, less pronounced right parietal and occipital lobe encephalomalacia. Radiologist advises cerebellar volume loss may relate to chronic Dilantin therapy, and neurologist recommends permanent discontinuation of Dilantin. This has been communicated to patient. As of 09/19/12, mental status was back to baseline.  3. Hypokalemia: Repleted as indicated.  4. Lactic acidosis: Lactic acid was 4.6 at presentation with a normal BUN and creatinine, and could be related to dehydration, as well as recurrent seizure activity. Managed with iv fluids, and as described above, with resolution. Lactic acid was normal at 0.7 on 09/19/12. IV fluids have since been discontinued, without deleterious effect.  5. Left side chest pain/Rib fractures: Patient was found to have some tenderness over left lateral rib cage and evidence of old bruising. CXR demonstrated minimally displaced posterior left 11th rib fracture and a possible fracture also of the posterior 10th rib. This has not proven problematic. 6. Hypertension: BP has remained well  controlled, during this hospitalization. Continued on  Metoprolol/Nifedipine SR.  7. LBBB (left bundle branch block: This was incidentally noted on 12-lead EKG and is chronic.  8. BPH (benign prostatic hyperplasia): Stable/Not problematic.  9. Hiatal Hernia/GERD: Asymptomatic.  10. RLE pain: Patient complained of RLE pain, on attempts at weight bearing on 09/19/12. X-Ray demonstrated remote fibular fracture. An upper fracture at the junction of the middle and distal thirds the fibula displays nonunion. A lower fracture just above the lateral malleolus has healed in a position of deformity and angulation. Managing with analgesics, pain continues to militate against effective physiotherapy. Examination of patient's right leg on 09/21/12, revealed ankle tenderness, as well as pain to flexion and extension of ankle joint. Suspect soft tissue injury, during seizures and fall. Uric acid level is normal. MRI of right foot/ankle, revealed osteoarthritis of the ankle joint as well as focal inflammation around the second metatarsal phalangeal joint, but no osteomyelitis, abscess, or joint effusion. This could represent capsulitis with adjacent inflammation. 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 He performed intra-articular steroid injection, and has recommended weight bear in CAM boot. For comfort. Anticipated full benefit of steroid injection in about 2 weeks. Patient will follow up withDr Victorino Dike, or his primary orthopedic surgeon at [facility].  11. Fever/UTI: Patient spiked a pyrexia of 101 degrees Farenheit on 09/19/12, necessitating septic w/u with U/A and cultures, CXR, blood cultures. Empiric Zosyn was commenced on that date, as aspiration was deemed a possible etiology. CXR revealed mild accentuation of the pulmonary markings, which may be chronic. Atelectasis is seen in the left lung base, adjacent to a hiatal hernia. Urinalysis  was unremarkable, but urine culture grew Enterococcus species. As of 09/20/12, patient had defervesced, and blood cultures remained negative. Transitioned to oral Ampicillin from 09/20/12 and complete a 7-day course of antibiotic treatment on 09/25/12.      Procedures:  See Below.   Consultations: Dr Ritta Slot, neurologist.   Discharge Exam: Filed Vitals:   09/23/12 2245 09/24/12 0137 09/24/12 0600 09/24/12 1000  BP: 107/65 114/50 127/51 137/78  Pulse: 54 60 62 74  Temp: 97.8 F (36.6 C) 97.8 F (36.6 C) 97.9 F (36.6 C) 97.9 F (36.6 C)  TempSrc: Oral Oral Oral Oral  Resp: 16 16 18 18   Height:      Weight:      SpO2: 96% 93% 98% 99%    General: Comfortable, alert, communicative, fully oriented, not short of breath at rest.  HEENT: No clinical pallor, no jaundice, no conjunctival injection or discharge. Hydration is satisfactory.  NECK: Supple, JVP not seen, no carotid bruits, no palpable lymphadenopathy, no palpable goiter.  CHEST: Clinically clear to auscultation, no wheezes, no crackles.  HEART: Sounds 1 and 2 heard, normal, regular, no murmurs.  ABDOMEN: Full, soft, non-tender, no palpable organomegaly, no palpable masses, normal bowel sounds.  GENITALIA: Not examined.  LOWER EXTREMITIES: No pitting edema, palpable peripheral pulses.  MUSCULOSKELETAL SYSTEM: Has deformity of right ankle, due to previous injury, associated with some swelling, otherwise, generalized osteoarthritic changes.  CENTRAL NERVOUS SYSTEM: No focal neurologic deficit on gross examination.  Discharge Instructions      Discharge Orders    Future Orders Please Complete By Expires   Diet - low sodium heart healthy      Increase activity slowly          Medication List  As of 09/24/2012 12:33 PM    STOP taking these medications         phenytoin 100 MG ER capsule   Commonly known as: DILANTIN      TAKE these medications         acetaminophen 325 MG tablet   Commonly known  as: TYLENOL   Take 650 mg by mouth every 6 (six) hours as needed. For pain      ampicillin 500 MG capsule   Commonly known as: PRINCIPEN   Take 1 capsule (500 mg total) by mouth every 6 (six) hours.      aspirin 81 MG chewable tablet   Chew 81 mg by mouth daily.      carboxymethylcellulose 1 % ophthalmic solution   Place 1 drop into both eyes 5 (five) times daily.      dorzolamide-timolol 22.3-6.8 MG/ML ophthalmic solution   Commonly known as: COSOPT   Place 1 drop into both eyes 2 (two) times daily.      finasteride 5 MG tablet   Commonly known as: PROSCAR   Take 5 mg by mouth daily.      GAVISCON PO   Take 1 tablet by mouth every 6 (six) hours as needed.      hydrochlorothiazide 25 MG tablet   Commonly known as: HYDRODIURIL   Take 25 mg by mouth daily.      HYDROcodone-acetaminophen 5-325 MG per tablet   Commonly known as: NORCO/VICODIN   Take 1 tablet by mouth every 4 (four) hours as needed (due to rib fractures).      latanoprost 0.005 % ophthalmic solution   Commonly known as: XALATAN   Place 1 drop into both eyes at bedtime.      levETIRAcetam 500 MG tablet   Commonly known as: KEPPRA   Take 1 tablet (500 mg total) by mouth 2 (two) times daily.      metoprolol tartrate 25 MG tablet   Commonly known as: LOPRESSOR   Take 25 mg by mouth 2 (two) times daily.      multivitamin with minerals Tabs   Take 1 tablet by mouth daily.      NIFEdipine 90 MG 24 hr tablet   Commonly known as: ADALAT CC   Take 90 mg by mouth daily.      omeprazole 20 MG capsule   Commonly known as: PRILOSEC   Take 20 mg by mouth daily.      polyethylene glycol packet   Commonly known as: MIRALAX / GLYCOLAX   Take 17 g by mouth daily as needed.      potassium chloride SA 20 MEQ tablet   Commonly known as: K-DUR,KLOR-CON   Take 20 mEq by mouth 2 (two) times daily.      terazosin 10 MG capsule   Commonly known as: HYTRIN   Take 10 mg by mouth at bedtime.         Follow-up  Information    Follow up with HEWITT, Jonny Ruiz, MD. In 4 weeks. (As needed)    Contact information:   [address] [place] [address] [phone]       Follow up with Default, Provider, MD. (Follow up with your Primar MD.)    Contact information:   [address] [place] [address] [phone]       Please follow up. (Follow up with SNF MD. )           The results of significant diagnostics from  this hospitalization (including imaging, microbiology, ancillary and laboratory) are listed below for reference.    Significant Diagnostic Studies: Dg Ribs Unilateral W/chest Left  09/18/2012  *RADIOLOGY REPORT*  Clinical Data: Pain after seizure  LEFT RIBS AND CHEST - 3+ VIEW  Comparison: 08/07/2012  Findings: There has been previous median sternotomy and CABG. Large hiatal hernia again evident.  The patient is rotated towards the right.  There is venous hypertension without frank edema.  Left rib details show old healed rib fractures on the left.  Marker in place in the area of concern posteriorly on the left, overlying a minimally displaced posterior 11th rib fracture. There may also be a fracture of the posterior left tenth rib.  IMPRESSION: Minimally displaced posterior left 11th rib fracture. Possible fracture also of the posterior 10th rib.  Large hiatal hernia.  Question venous hypertension.   Original Report Authenticated By: Paulina Fusi, M.D.    Dg Tibia/fibula Right  09/19/2012  *RADIOLOGY REPORT*  Clinical Data: Right ankle pain.  Old injury.  RIGHT TIBIA AND FIBULA - 2 VIEW  Comparison: None.  Findings: There are remote distal tibia and fibular fractures.  There are two components to the remote fibular fracture. An upper fracture at the junction of the middle and distal thirds the fibula displays nonunion.  A lower fracture just above the lateral malleolus has healed in a position of deformity and angulation.  The distal tibial fracture has healed with significant  deformity and exuberant callus.  There is a varus deformity of the lower leg. The tibiotalar joint and foot appear demineralized but the ankle mortise is grossly intact. A bone infarct is seen in the proximal tibia.  IMPRESSION: Post traumatic changes as described.   Original Report Authenticated By: Davonna Belling, M.D.    Ct Head Wo Contrast  09/17/2012  *RADIOLOGY REPORT*  Clinical Data: Altered mental status  CT HEAD WITHOUT CONTRAST  Technique:  Contiguous axial images were obtained from the base of the skull through the vertex without contrast.  Comparison: 10/31/2006  Findings: Generalized atrophy.  Chronic right temporal parietal infarct, unchanged.  There is prominent cerebellar atrophy which is unchanged.  No acute infarct.  Mild chronic microvascular ischemia in the white matter.  Negative for hemorrhage or mass.  Chronic burr hole right parietal bone.  Mild chronic sinusitis.  IMPRESSION: Atrophy and chronic right temporal parietal infarct.  No acute abnormality.   Original Report Authenticated By: Janeece Riggers, M.D.    Mr Brain Wo Contrast  09/18/2012  *RADIOLOGY REPORT*  Clinical Data: 67 year old male with altered mental status. Seizure disorder.  Combative.  Elevated Dilantin level.  MRI HEAD WITHOUT CONTRAST  Technique:  Multiplanar, multiecho pulse sequences of the brain and surrounding structures were obtained according to standard protocol without intravenous contrast.  Comparison: 09/17/2012, 10/31/2006.  Findings: Study is intermittently degraded by motion artifact despite repeated imaging attempts.  Chronic encephalomalacia throughout much of the right temporal lobe and also involving the right parietal lobe.  Encephalomalacia versus volume loss in the right occipital pole.  Ex vacuo enlargement of the right lateral ventricle, especially the atrium and temporal horn.  No restricted diffusion to suggest acute infarction.  No midline shift, mass effect, evidence of mass lesion,  ventriculomegaly, extra-axial collection or acute intracranial hemorrhage. Cervicomedullary junction and pituitary are within normal limits. Major intracranial vascular flow voids are grossly preserved. Additional scattered periventricular and subcortical white matter T2 and FLAIR hyperintensity, more so the left hemisphere.  Brain stem within normal limits.  Cerebellar volume loss.  Negative visualized cervical spine. Visualized bone marrow signal is within normal limits.  Grossly normal orbit soft tissues.  Mild paranasal sinus mucosal thickening.  Mastoids are clear.  Grossly negative scalp soft tissues.  IMPRESSION: 1.  Motion degraded exam despite repeated imaging attempts. No acute intracranial abnormality. 2.  Extensive chronic right temporal lobe encephalomalacia.  Less pronounced right parietal and occipital lobe encephalomalacia/volume loss. 3.  Cerebellar volume loss which may relate to chronic Dilantin therapy.   Original Report Authenticated By: Erskine Speed, M.D.    Dg Chest Port 1 View  09/19/2012  *RADIOLOGY REPORT*  Clinical Data: Fever.  PORTABLE CHEST - 1 VIEW  Comparison: 09/18/2012.  Findings: Trachea is midline.  Heart size stable.  Large hiatal hernia.  Mild accentuation of the pulmonary markings may be chronic. Atelectasis is seen in the left lung base, adjacent to a hiatal hernia.  There are irregularities of the lower ribs bilaterally.  IMPRESSION:  1.  Large hiatal hernia with atelectasis in the medial left lower lobe. 2.  Irregularities of the lower ribs bilaterally, indicative of age indeterminate fractures.   Original Report Authenticated By: Leanna Battles, M.D.     Microbiology: Recent Results (from the past 240 hour(s))  URINE CULTURE     Status: Normal   Collection Time   09/17/12  7:22 PM      Component Value Range Status Comment   Specimen Description URINE, CATHETERIZED   Final    Special Requests NONE   Final    Culture  Setup Time 09/17/2012 20:34   Final    Colony  Count NO GROWTH   Final    Culture NO GROWTH   Final    Report Status 09/18/2012 FINAL   Final   MRSA PCR SCREENING     Status: Normal   Collection Time   09/18/12 12:40 AM      Component Value Range Status Comment   MRSA by PCR NEGATIVE  NEGATIVE Final   URINE CULTURE     Status: Normal   Collection Time   09/19/12  7:37 AM      Component Value Range Status Comment   Specimen Description URINE, CATHETERIZED   Final    Special Requests NONE   Final    Culture  Setup Time 09/19/2012 08:53   Final    Colony Count >=100,000 COLONIES/ML   Final    Culture ENTEROCOCCUS SPECIES   Final    Report Status 09/21/2012 FINAL   Final    Organism ID, Bacteria ENTEROCOCCUS SPECIES   Final   CULTURE, BLOOD (ROUTINE X 2)     Status: Normal (Preliminary result)   Collection Time   09/19/12  4:00 PM      Component Value Range Status Comment   Specimen Description BLOOD HAND LEFT   Final    Special Requests BOTTLES DRAWN AEROBIC AND ANAEROBIC 10CC   Final    Culture  Setup Time 09/19/2012 20:50   Final    Culture     Final    Value:        BLOOD CULTURE RECEIVED NO GROWTH TO DATE CULTURE WILL BE HELD FOR 5 DAYS BEFORE ISSUING A FINAL NEGATIVE REPORT   Report Status PENDING   Incomplete   CULTURE, BLOOD (ROUTINE X 2)     Status: Normal (Preliminary result)   Collection Time   09/19/12  4:15 PM      Component Value Range Status Comment   Specimen Description  BLOOD ARM RIGHT   Final    Special Requests BOTTLES DRAWN AEROBIC AND ANAEROBIC 10CC   Final    Culture  Setup Time 09/19/2012 20:50   Final    Culture     Final    Value:        BLOOD CULTURE RECEIVED NO GROWTH TO DATE CULTURE WILL BE HELD FOR 5 DAYS BEFORE ISSUING A FINAL NEGATIVE REPORT   Report Status PENDING   Incomplete   URINE CULTURE     Status: Normal   Collection Time   09/19/12  6:20 PM      Component Value Range Status Comment   Specimen Description URINE, CATHETERIZED   Final    Special Requests NONE   Final    Culture  Setup Time  09/19/2012 18:47   Final    Colony Count >=100,000 COLONIES/ML   Final    Culture ENTEROCOCCUS SPECIES   Final    Report Status 09/21/2012 FINAL   Final    Organism ID, Bacteria ENTEROCOCCUS SPECIES   Final      Labs: Basic Metabolic Panel:  Lab 09/24/12 1610 09/22/12 0605 09/21/12 0640 09/18/12 0623 09/17/12 1606  NA -- 135 140 139 138  K -- 4.2 3.7 3.2* 3.5  CL -- 99 105 104 99  CO2 -- 21 24 22 24   GLUCOSE -- 101* 105* 91 124*  BUN -- 19 15 19 21   CREATININE 0.92 0.86 0.89 0.75 0.92  CALCIUM -- 9.1 9.1 8.5 9.4  MG -- -- -- -- --  PHOS -- -- -- -- --   Liver Function Tests:  Lab 09/17/12 1606  AST 61*  ALT 45  ALKPHOS 101  BILITOT 0.3  PROT 8.3  ALBUMIN 3.9   No results found for this basename: LIPASE:5,AMYLASE:5 in the last 168 hours No results found for this basename: AMMONIA:5 in the last 168 hours CBC:  Lab 09/22/12 0605 09/21/12 0640 09/18/12 0623 09/17/12 1606  WBC 7.5 6.9 5.8 9.7  NEUTROABS -- -- -- 7.2  HGB 11.0* 10.8* 11.1* 12.9*  HCT 33.7* 33.0* 33.4* 38.6*  MCV 81.4 80.9 80.3 80.6  PLT 189 164 143* 171   Cardiac Enzymes:  Lab 09/17/12 1624  CKTOTAL --  CKMB --  CKMBINDEX --  TROPONINI <0.30   BNP: BNP (last 3 results) No results found for this basename: PROBNP:3 in the last 8760 hours CBG:  Lab 09/21/12 1641 09/17/12 1515  GLUCAP 117* 108*       Signed:  Miasia Crabtree,CHRISTOPHER  [facility] 09/24/2012, 12:33 PM

## 2012-09-20 NOTE — Progress Notes (Signed)
[facility] PROGRESS NOTE  [name] ZOX:[id number] DOB: 09-05-1946 DOA: 09/17/2012 PCP: Default, Provider, MD  Assessment/Plan: Principal Problem:  *Seizures with known seizure disorder Active Problems:  Hypertension  LBBB (left bundle branch block)  BPH (benign prostatic hyperplasia)  Hypokalemia  Lactic acidosis  Encephalopathy  Rib fracture    1. Seizures with known seizure disorder: Patient has a known history of seizure disorder, for which he was on Dilantin therapy, although compliance is some what suspect. He is s/p recent hospitalization at the [facility], for Dilantin toxicity. He presented on this occasion with recurrent seizures, and a subtherapeutic Dilantin level. Seizure activity was addressed with iv Keppra, with satisfactory clinical response. Neurology consultation was provided by Dr. Ritta Slot. Patient has had no further seizure activity since admission and brain MRI was unrevealing for any acute intracranial abnormality. Patient has been transitioned to PO Keppra, effective 09/19/12.  2. Encephalopathy: Patient appeared to have acute encephalopathy, precipitated by recurrent seizure activity some of which appears to not have been tonic-clonic at times. MRI did reveal extensive chronic right temporal lobe encephalomalacia, less pronounced right parietal and occipital lobe encephalomalacia. Radiologist advises cerebellar volume loss may relate to chronic Dilantin therapy, and neurologist recommends permanent discontinuation of Dilantin. This has been communicated to patient. As of 09/19/12, mental status is back to baseline.  3. Hypokalemia: Repleted as indicated.  4. Lactic acidosis: Lactic acid was 4.6 at presentation with a normal BUN and creatinine and could be related to dehydration, as well as recurrent seizure activity. Managed with iv fluids, and as described above, with resolution. Lactic acid was normal at 0.7 on 09/19/12. IV fluids have since been  discontinued.  5. Left side chest pain/Rib fractures: Patient was found to have some tenderness over left lateral rib cage and evidence of old bruising. CXR demonstrated minimally displaced posterior left 11th rib fracture and a possible fracture also of the posterior 10th rib. 6. Hypertension: BP has remained well controlled, during this hospitalization. Continue Metoprolol/Nifedipine SR.  7. LBBB (left bundle branch block: This was incidentally noted on 12-lead EKG and is chronic.  8. BPH (benign prostatic hyperplasia): Stable/Not problematic.   9. Hiatal Hernia/GERD: Asymptomatic.  10. RLE pain: Patient complained of RLE pain, on attempts at weight bearing on 09/19/12. X-Ray demonstrated remote fibular fracture. An upper fracture at the junction of the middle and distal thirds the fibula  displays nonunion. A lower fracture just above the lateral malleolus has healed in a position of deformity and angulation. Managing with analgesics.  11. Fever: Patient spiked a pyrexia of 101 degrees Farenheit on 09/19/12, necessitating septic w/u with U/A and cultures, CXR, blood cultures. Empiric Zosyn was commenced on that date, as aspiration was deemed a possible etiology. CXR revealed mild accentuation of the pulmonary markings, which may be chronic. Atelectasis is seen in the left lung base, adjacent to a hiatal hernia. Urinalysis was unremarkable, but urine culture grew enterococcus species. As of 09/20/12, patient has defervesced, and blood cultures have remained negative. Transitioned to oral Ampicillin from 09/20/12.       Code Status: Full Code.  Family Communication:  Disposition Plan: To be determined. Stable for discharge today, pending PT/OT evaluation and recommendations.    Brief narrative: 67 year old male with history of HTN, BPH, Hiatal hernia, CAD s/p CABG, Chronic LBBB, previous CVA, seizure disorder who was admitted at [facility] in the last week and released on 09/16/12.  for seizures, he  was found to have elevated Dilantin levels. Patient  was apparently normal at that time of release from [facility] on 09/16/12, however progressively became confused after arriving home. In the ED patient was noted to be combative and very agitated requiring restraints. He was given 2 mg of Ativan, 2 mg of Versed. CT head was obtained and subsequently patient received phenobarbital 65 mg. After that patient became more alert and awake and responsive. Patient's grandson had reported that 1-2 hours before coming to the [facility] ED patient had a seizure. Also family reported that patient has auras before the seizure and he keeps 'popping' Dilantin. Patient was admitted for for further management.   Consultants:  Dr Ritta Slot, neurologist.   Procedures:  Head CT scan.  Brain MRI.   CXR.   Antibiotics:  N/A.   HPI/Subjective: No new issues.   Objective: Vital signs in last 24 hours: Temp:  [97.9 F (36.6 C)-101.1 F (38.4 C)] 98 F (36.7 C) (01/16 1000) Pulse Rate:  [64-96] 96  (01/16 1000) Resp:  [17-20] 18  (01/16 1000) BP: (119-148)/(60-76) 148/74 mmHg (01/16 1000) SpO2:  [97 %-100 %] 100 % (01/16 1000) Weight change:  Last BM Date: 09/18/12  Intake/Output from previous day: 01/15 0701 - 01/16 0700 In: 120 [P.O.:120] Out: 350 [Urine:350] Total I/O In: -  Out: 400 [Urine:400]   Physical Exam: General: Comfortable, alert, communicative, fully oriented, not short of breath at rest.  HEENT:  No clinical pallor, no jaundice, no conjunctival injection or discharge. Hydration is satisfactory.  NECK:  Supple, JVP not seen, no carotid bruits, no palpable lymphadenopathy, no palpable goiter. CHEST:  Clinically clear to auscultation, no wheezes, no crackles. HEART:  Sounds 1 and 2 heard, normal, regular, no murmurs. ABDOMEN:  Full, soft, non-tender, no palpable organomegaly, no palpable masses, normal bowel sounds. GENITALIA:  Not examined. LOWER EXTREMITIES:  No  pitting edema, palpable peripheral pulses.  MUSCULOSKELETAL SYSTEM:  Has deformity of right ankle, due to previous injury, otherwise, generalized osteoarthritic changes. CENTRAL NERVOUS SYSTEM:  No focal neurologic deficit on gross examination.  Lab Results:  [facility] 09/18/12 0623 09/17/12 1606  WBC 5.8 9.7  HGB 11.1* 12.9*  HCT 33.4* 38.6*  PLT 143* 171    Basename 09/18/12 0623 09/17/12 1606  NA 139 138  K 3.2* 3.5  CL 104 99  CO2 22 24  GLUCOSE 91 124*  BUN 19 21  CREATININE 0.75 0.92  CALCIUM 8.5 9.4   Recent Results (from the past 240 hour(s))  URINE CULTURE     Status: Normal   Collection Time   09/17/12  7:22 PM      Component Value Range Status Comment   Specimen Description URINE, CATHETERIZED   Final    Special Requests NONE   Final    Culture  Setup Time 09/17/2012 20:34   Final    Colony Count NO GROWTH   Final    Culture NO GROWTH   Final    Report Status 09/18/2012 FINAL   Final   MRSA PCR SCREENING     Status: Normal   Collection Time   09/18/12 12:40 AM      Component Value Range Status Comment   MRSA by PCR NEGATIVE  NEGATIVE Final   URINE CULTURE     Status: Normal (Preliminary result)   Collection Time   09/19/12  7:37 AM      Component Value Range Status Comment   Specimen Description URINE, CATHETERIZED   Final    Special Requests NONE   Final  Culture  Setup Time 09/19/2012 08:53   Final    Colony Count >=100,000 COLONIES/ML   Final    Culture ENTEROCOCCUS SPECIES   Final    Report Status PENDING   Incomplete   CULTURE, BLOOD (ROUTINE X 2)     Status: Normal (Preliminary result)   Collection Time   09/19/12  4:00 PM      Component Value Range Status Comment   Specimen Description BLOOD HAND LEFT   Final    Special Requests BOTTLES DRAWN AEROBIC AND ANAEROBIC 10CC   Final    Culture  Setup Time 09/19/2012 20:50   Final    Culture     Final    Value:        BLOOD CULTURE RECEIVED NO GROWTH TO DATE CULTURE WILL BE HELD FOR 5 DAYS BEFORE  ISSUING A FINAL NEGATIVE REPORT   Report Status PENDING   Incomplete   CULTURE, BLOOD (ROUTINE X 2)     Status: Normal (Preliminary result)   Collection Time   09/19/12  4:15 PM      Component Value Range Status Comment   Specimen Description BLOOD ARM RIGHT   Final    Special Requests BOTTLES DRAWN AEROBIC AND ANAEROBIC 10CC   Final    Culture  Setup Time 09/19/2012 20:50   Final    Culture     Final    Value:        BLOOD CULTURE RECEIVED NO GROWTH TO DATE CULTURE WILL BE HELD FOR 5 DAYS BEFORE ISSUING A FINAL NEGATIVE REPORT   Report Status PENDING   Incomplete      Studies/Results: Dg Ribs Unilateral W/chest Left  09/18/2012  *RADIOLOGY REPORT*  Clinical Data: Pain after seizure  LEFT RIBS AND CHEST - 3+ VIEW  Comparison: 08/07/2012  Findings: There has been previous median sternotomy and CABG. Large hiatal hernia again evident.  The patient is rotated towards the right.  There is venous hypertension without frank edema.  Left rib details show old healed rib fractures on the left.  Marker in place in the area of concern posteriorly on the left, overlying a minimally displaced posterior 11th rib fracture. There may also be a fracture of the posterior left tenth rib.  IMPRESSION: Minimally displaced posterior left 11th rib fracture. Possible fracture also of the posterior 10th rib.  Large hiatal hernia.  Question venous hypertension.   Original Report Authenticated By: Paulina Fusi, M.D.    Dg Tibia/fibula Right  09/19/2012  *RADIOLOGY REPORT*  Clinical Data: Right ankle pain.  Old injury.  RIGHT TIBIA AND FIBULA - 2 VIEW  Comparison: None.  Findings: There are remote distal tibia and fibular fractures.  There are two components to the remote fibular fracture. An upper fracture at the junction of the middle and distal thirds the fibula displays nonunion.  A lower fracture just above the lateral malleolus has healed in a position of deformity and angulation.  The distal tibial fracture has healed  with significant deformity and exuberant callus.  There is a varus deformity of the lower leg. The tibiotalar joint and foot appear demineralized but the ankle mortise is grossly intact. A bone infarct is seen in the proximal tibia.  IMPRESSION: Post traumatic changes as described.   Original Report Authenticated By: Davonna Belling, M.D.    Dg Chest Port 1 View  09/19/2012  *RADIOLOGY REPORT*  Clinical Data: Fever.  PORTABLE CHEST - 1 VIEW  Comparison: 09/18/2012.  Findings: Trachea is midline.  Heart  size stable.  Large hiatal hernia.  Mild accentuation of the pulmonary markings may be chronic. Atelectasis is seen in the left lung base, adjacent to a hiatal hernia.  There are irregularities of the lower ribs bilaterally.  IMPRESSION:  1.  Large hiatal hernia with atelectasis in the medial left lower lobe. 2.  Irregularities of the lower ribs bilaterally, indicative of age indeterminate fractures.   Original Report Authenticated By: Leanna Battles, M.D.     Medications: Scheduled Meds:    . aspirin  81 mg Oral Daily  . dorzolamide-timolol  1 drop Both Eyes BID  . enoxaparin (LOVENOX) injection  40 mg Subcutaneous Q24H  . finasteride  5 mg Oral Daily  . latanoprost  1 drop Both Eyes QHS  . levETIRAcetam  500 mg Oral BID  . metoprolol tartrate  25 mg Oral BID  . multivitamin with minerals  1 tablet Oral Daily  . NIFEdipine  90 mg Oral Daily  . pantoprazole  40 mg Oral Daily  . piperacillin-tazobactam (ZOSYN)  IV  3.375 g Intravenous Q8H  . polyvinyl alcohol  1 drop Both Eyes 5 X Daily  . terazosin  10 mg Oral QHS   Continuous Infusions:  PRN Meds:.acetaminophen, acetaminophen, alum & mag hydroxide-simeth, HYDROcodone-acetaminophen, HYDROmorphone (DILAUDID) injection, ibuprofen, ondansetron (ZOFRAN) IV, ondansetron    LOS: 3 days   Tarry Fountain,CHRISTOPHER  [facility] Pager [phone]. If 8PM-8AM, please contact night-coverage at [email or web address], password [facility] 09/20/2012, 1:58 PM  LOS: 3 days

## 2012-09-20 NOTE — Clinical Social Work Psychosocial (Signed)
     Clinical Social Work Department BRIEF PSYCHOSOCIAL ASSESSMENT 09/20/2012  Patient:  [name], [name]     Account Number:  [id number]     Admit date:  09/17/2012  Clinical Social Worker:  Peggyann Shoals  Date/Time:  09/19/2012 05:00 PM  Referred by:  Physician  Date Referred:  09/19/2012 Referred for  SNF Placement   Other Referral:   Interview type:  Patient Other interview type:    PSYCHOSOCIAL DATA Living Status:  ALONE Admitted from facility:   Level of care:   Primary support name:  Duffy Bruce Primary support relationship to patient:  SIBLING Degree of support available:   supportive.    CURRENT CONCERNS Current Concerns  Post-Acute Placement   Other Concerns:    SOCIAL WORK ASSESSMENT / PLAN CSW met with pt to address consult. CSW introduced herself and explained role of social work. CSW explained the process of discharging to SNF.    Pt lives alone and agrees that he will need ST-SNF at discharge for continued rehab prior to returning home.    CSW will initiate SNF search and follow up with bed offers. Per pt request, CSW will contact pt's brother. CSW will continue to follow.   Assessment/plan status:  Psychosocial Support/Ongoing Assessment of Needs Other assessment/ plan:   Information/referral to community resources:   SNF list    PATIENTS/FAMILYS RESPONSE TO PLAN OF CARE: Pt was very pleasant and agreeable to discharge plan.

## 2012-09-21 DIAGNOSIS — N390 Urinary tract infection, site not specified: Secondary | ICD-10-CM | POA: Diagnosis not present

## 2012-09-21 LAB — CBC
HCT: 33 % — ABNORMAL LOW (ref 39.0–52.0)
Hemoglobin: 10.8 g/dL — ABNORMAL LOW (ref 13.0–17.0)
RDW: 14.4 % (ref 11.5–15.5)
WBC: 6.9 10*3/uL (ref 4.0–10.5)

## 2012-09-21 LAB — URINE CULTURE: Colony Count: >=100000

## 2012-09-21 LAB — BASIC METABOLIC PANEL
BUN: 15 mg/dL (ref 6–23)
Chloride: 105 mEq/L (ref 96–112)
GFR calc Af Amer: >90 mL/min (ref >90)
GFR calc non Af Amer: 86 mL/min — ABNORMAL LOW (ref >90)
Glucose, Bld: 105 mg/dL — ABNORMAL HIGH (ref 70–99)
Potassium: 3.7 mEq/L (ref 3.5–5.1)
Sodium: 140 mEq/L (ref 135–145)

## 2012-09-21 LAB — PHENYTOIN LEVEL, FREE AND TOTAL
Phenytoin, Free: 1.6 mg/L (ref 1.0–2.0)
Phenytoin, Total: 8.8 mg/L — ABNORMAL LOW (ref 10.0–20.0)

## 2012-09-21 LAB — URIC ACID: Uric Acid, Serum: 4.6 mg/dL (ref 4.0–7.8)

## 2012-09-21 MED ORDER — ALBUTEROL SULFATE (5 MG/ML) 0.5% IN NEBU
2.5000 mg | INHALATION_SOLUTION | RESPIRATORY_TRACT | Status: DC | PRN
  Administered 2012-09-21 – 2012-09-23 (×4): 2.5 mg via RESPIRATORY_TRACT
  Filled 2012-09-21 (×4): qty 0.5

## 2012-09-21 NOTE — Progress Notes (Signed)
[facility] PROGRESS NOTE  CYNCERE SONTAG QMV:[id number] DOB: 09-Mar-1946 DOA: 09/17/2012 PCP: Default, Provider, MD  Assessment/Plan: Principal Problem:  *Seizures with known seizure disorder Active Problems:  Hypertension  LBBB (left bundle branch block)  BPH (benign prostatic hyperplasia)  Hypokalemia  Lactic acidosis  Encephalopathy  Rib fracture    1. Seizures with known seizure disorder: Patient has a known history of seizure disorder, for which he was on Dilantin therapy, although compliance is some what suspect. He is s/p recent hospitalization at the [facility], for Dilantin toxicity. He presented on this occasion with recurrent seizures, and a subtherapeutic Dilantin level. Seizure activity was addressed with iv Keppra, with satisfactory clinical response. Neurology consultation was provided by Dr. Ritta Slot. Patient has had no further seizure activity since admission and brain MRI was unrevealing for any acute intracranial abnormality. Patient has been transitioned to PO Keppra, effective 09/19/12, and has remained stable.  2. Encephalopathy: Patient appeared to have acute encephalopathy, precipitated by recurrent seizure activity some of which appears to not have been tonic-clonic at times. MRI did reveal extensive chronic right temporal lobe encephalomalacia, less pronounced right parietal and occipital lobe encephalomalacia. Radiologist advises cerebellar volume loss may relate to chronic Dilantin therapy, and neurologist recommends permanent discontinuation of Dilantin. This has been communicated to patient. As of 09/19/12, mental status was back to baseline.  3. Hypokalemia: Repleted as indicated.  4. Lactic acidosis: Lactic acid was 4.6 at presentation with a normal BUN and creatinine and could be related to dehydration, as well as recurrent seizure activity. Managed with iv fluids, and as described above, with resolution. Lactic acid was normal at 0.7 on 09/19/12.  IV fluids have since been discontinued.  5. Left side chest pain/Rib fractures: Patient was found to have some tenderness over left lateral rib cage and evidence of old bruising. CXR demonstrated minimally displaced posterior left 11th rib fracture and a possible fracture also of the posterior 10th rib. 6. Hypertension: BP has remained well controlled, during this hospitalization. Continue Metoprolol/Nifedipine SR.  7. LBBB (left bundle branch block: This was incidentally noted on 12-lead EKG and is chronic.  8. BPH (benign prostatic hyperplasia): Stable/Not problematic.   9. Hiatal Hernia/GERD: Asymptomatic.  10. RLE pain: Patient complained of RLE pain, on attempts at weight bearing on 09/19/12. X-Ray demonstrated remote fibular fracture. An upper fracture at the junction of the middle and distal thirds the fibula  displays nonunion. A lower fracture just above the lateral malleolus has healed in a position of deformity and angulation. Managing with analgesics, pain continues to militate against effective physiotherapy. Examination of patient's right leg today, revealed ankle tenderness, as well as pain to flexion and extension of ankle joint. Suspect soft tissue injury, during seizures and fall. Have ordered MRI, to evaluate foot and ankle anatomy. If MRI is unrevealing may need emipiric treatment for possible gout. Will check uric acid levels.  11. Fever/UTI: Patient spiked a pyrexia of 101 degrees Farenheit on 09/19/12, necessitating septic w/u with U/A and cultures, CXR, blood cultures. Empiric Zosyn was commenced on that date, as aspiration was deemed a possible etiology. CXR revealed mild accentuation of the pulmonary markings, which may be chronic. Atelectasis is seen in the left lung base, adjacent to a hiatal hernia. Urinalysis was unremarkable, but urine culture grew enterococcus species. As of 09/20/12, patient has defervesced, and blood cultures have remained negative. Transitioned to oral  Ampicillin from 09/20/12, to be completed on 09/24/12.       Code Status:  Full Code.  Family Communication:  Disposition Plan: To be determined. Aiming discharge to SNF on 09/24/12.   Brief narrative: 67 year old male with history of HTN, BPH, Hiatal hernia, CAD s/p CABG, Chronic LBBB, previous CVA, seizure disorder who was admitted at [facility] in the last week and released on 09/16/12.  for seizures, he was found to have elevated Dilantin levels. Patient was apparently normal at that time of release from [facility] on 09/16/12, however progressively became confused after arriving home. In the ED patient was noted to be combative and very agitated requiring restraints. He was given 2 mg of Ativan, 2 mg of Versed. CT head was obtained and subsequently patient received phenobarbital 65 mg. After that patient became more alert and awake and responsive. Patient's grandson had reported that 1-2 hours before coming to the [facility] ED patient had a seizure. Also family reported that patient has auras before the seizure and he keeps 'popping' Dilantin. Patient was admitted for for further management.   Consultants:  Dr Ritta Slot, neurologist.   Procedures:  Head CT scan.  Brain MRI.   CXR.   Antibiotics:  N/A.   HPI/Subjective: C/O Right forefoot and ankle pain.   Objective: Vital signs in last 24 hours: Temp:  [99.2 F (37.3 C)-100.2 F (37.9 C)] 99.3 F (37.4 C) (01/17 1430) Pulse Rate:  [71-82] 71  (01/17 1430) Resp:  [18-20] 18  (01/17 1430) BP: (97-135)/(59-80) 120/70 mmHg (01/17 1430) SpO2:  [97 %-99 %] 98 % (01/17 1628) Weight change:  Last BM Date: 09/18/12  Intake/Output from previous day: 01/16 0701 - 01/17 0700 In: -  Out: 700 [Urine:700] Total I/O In: -  Out: 150 [Urine:150]   Physical Exam: General: Comfortable, alert, communicative, fully oriented, not short of breath at rest.  HEENT:  No clinical pallor, no jaundice, no conjunctival injection or  discharge. Hydration is satisfactory.  NECK:  Supple, JVP not seen, no carotid bruits, no palpable lymphadenopathy, no palpable goiter. CHEST:  Clinically clear to auscultation, no wheezes, no crackles. HEART:  Sounds 1 and 2 heard, normal, regular, no murmurs. ABDOMEN:  Full, soft, non-tender, no palpable organomegaly, no palpable masses, normal bowel sounds. GENITALIA:  Not examined. LOWER EXTREMITIES:  No pitting edema, palpable peripheral pulses.  MUSCULOSKELETAL SYSTEM:  Has deformity of right ankle, due to previous injury, otherwise, generalized osteoarthritic changes. Examination of patient's right leg today, revealed ankle tenderness, as well as pain to flexion and extension of ankle joint.  CENTRAL NERVOUS SYSTEM:  No focal neurologic deficit on gross examination.  Lab Results:  [facility] 09/21/12 0640  WBC 6.9  HGB 10.8*  HCT 33.0*  PLT 164    Basename 09/21/12 0640  NA 140  K 3.7  CL 105  CO2 24  GLUCOSE 105*  BUN 15  CREATININE 0.89  CALCIUM 9.1   Recent Results (from the past 240 hour(s))  URINE CULTURE     Status: Normal   Collection Time   09/17/12  7:22 PM      Component Value Range Status Comment   Specimen Description URINE, CATHETERIZED   Final    Special Requests NONE   Final    Culture  Setup Time 09/17/2012 20:34   Final    Colony Count NO GROWTH   Final    Culture NO GROWTH   Final    Report Status 09/18/2012 FINAL   Final   MRSA PCR SCREENING     Status: Normal   Collection Time  09/18/12 12:40 AM      Component Value Range Status Comment   MRSA by PCR NEGATIVE  NEGATIVE Final   URINE CULTURE     Status: Normal   Collection Time   09/19/12  7:37 AM      Component Value Range Status Comment   Specimen Description URINE, CATHETERIZED   Final    Special Requests NONE   Final    Culture  Setup Time 09/19/2012 08:53   Final    Colony Count >=100,000 COLONIES/ML   Final    Culture ENTEROCOCCUS SPECIES   Final    Report Status 09/21/2012 FINAL    Final    Organism ID, Bacteria ENTEROCOCCUS SPECIES   Final   CULTURE, BLOOD (ROUTINE X 2)     Status: Normal (Preliminary result)   Collection Time   09/19/12  4:00 PM      Component Value Range Status Comment   Specimen Description BLOOD HAND LEFT   Final    Special Requests BOTTLES DRAWN AEROBIC AND ANAEROBIC 10CC   Final    Culture  Setup Time 09/19/2012 20:50   Final    Culture     Final    Value:        BLOOD CULTURE RECEIVED NO GROWTH TO DATE CULTURE WILL BE HELD FOR 5 DAYS BEFORE ISSUING A FINAL NEGATIVE REPORT   Report Status PENDING   Incomplete   CULTURE, BLOOD (ROUTINE X 2)     Status: Normal (Preliminary result)   Collection Time   09/19/12  4:15 PM      Component Value Range Status Comment   Specimen Description BLOOD ARM RIGHT   Final    Special Requests BOTTLES DRAWN AEROBIC AND ANAEROBIC 10CC   Final    Culture  Setup Time 09/19/2012 20:50   Final    Culture     Final    Value:        BLOOD CULTURE RECEIVED NO GROWTH TO DATE CULTURE WILL BE HELD FOR 5 DAYS BEFORE ISSUING A FINAL NEGATIVE REPORT   Report Status PENDING   Incomplete   URINE CULTURE     Status: Normal   Collection Time   09/19/12  6:20 PM      Component Value Range Status Comment   Specimen Description URINE, CATHETERIZED   Final    Special Requests NONE   Final    Culture  Setup Time 09/19/2012 18:47   Final    Colony Count >=100,000 COLONIES/ML   Final    Culture ENTEROCOCCUS SPECIES   Final    Report Status 09/21/2012 FINAL   Final    Organism ID, Bacteria ENTEROCOCCUS SPECIES   Final      Studies/Results: No results found.  Medications: Scheduled Meds:    . albuterol  2.5 mg Nebulization Once  . ampicillin  500 mg Oral Q6H  . aspirin  81 mg Oral Daily  . dorzolamide-timolol  1 drop Both Eyes BID  . enoxaparin (LOVENOX) injection  40 mg Subcutaneous Q24H  . finasteride  5 mg Oral Daily  . latanoprost  1 drop Both Eyes QHS  . levETIRAcetam  500 mg Oral BID  . metoprolol tartrate  25 mg  Oral BID  . multivitamin with minerals  1 tablet Oral Daily  . NIFEdipine  90 mg Oral Daily  . pantoprazole  40 mg Oral Daily  . polyvinyl alcohol  1 drop Both Eyes 5 X Daily  . terazosin  10 mg Oral QHS  .  traMADol  50 mg Oral TID   Continuous Infusions:  PRN Meds:.acetaminophen, acetaminophen, albuterol, alum & mag hydroxide-simeth, HYDROcodone-acetaminophen, HYDROmorphone (DILAUDID) injection, ibuprofen, ondansetron (ZOFRAN) IV, ondansetron    LOS: 4 days   Raley Novicki,CHRISTOPHER  [facility] Pager [phone]. If 8PM-8AM, please contact night-coverage at [email or web address], password [facility] 09/21/2012, 6:12 PM  LOS: 4 days

## 2012-09-21 NOTE — Progress Notes (Signed)
Physical Therapy Treatment Patient Details Name: [name] MRN: [id number] DOB: [birth year] Today's Date: 09/21/2012 Time: 4098-1191 PT Time Calculation (min): 16 min  PT Assessment / Plan / Recommendation Comments on Treatment Session  Pt unable to ambulate this session due to R ankle pain.  RN notified for pain medication.  d    Follow Up Recommendations  SNF;Supervision/Assistance - 24 hour     Does the patient have the potential to tolerate intense rehabilitation     Barriers to Discharge        Equipment Recommendations  Rolling walker with 5" wheels    Recommendations for Other Services    Frequency Min 3X/week   Plan Discharge plan remains appropriate    Precautions / Restrictions Precautions Precautions: Fall Precaution Comments: Pt unable to WB through R LE today.   Restrictions Weight Bearing Restrictions: No   Pertinent Vitals/Pain 10/10 Rt ankle with WBing.  Unable to ambulate.  RN notified for pain medication.      Mobility  Bed Mobility Bed Mobility: Supine to Sit;Sitting - Scoot to Edge of Bed Supine to Sit: 5: Supervision;With rails Sitting - Scoot to Edge of Bed: 5: Supervision;With rail Transfers Transfers: Sit to Stand;Stand to Sit;Stand Pivot Transfers Sit to Stand: 4: Min assist;With upper extremity assist;From bed Stand to Sit: 4: Min assist;With upper extremity assist;With armrests;To chair/3-in-1;To bed Stand Pivot Transfers: 4: Min assist Details for Transfer Assistance: Cues for hand placement & technique.  Pt unable to WB through Rt LE due to ankle pain.   Ambulation/Gait Ambulation/Gait Assistance: Not tested (comment) (due to pt unable to WB through RLE.  )      PT Goals Acute Rehab PT Goals Time For Goal Achievement: 10/03/12 Potential to Achieve Goals: Good Pt will go Supine/Side to Sit: with modified independence;with HOB 0 degrees PT Goal: Supine/Side to Sit - Progress: Progressing toward goal Pt will go Sit to Supine/Side:  with modified independence;with HOB 0 degrees Pt will go Sit to Stand: with supervision;with upper extremity assist PT Goal: Sit to Stand - Progress: Not progressing Pt will go Stand to Sit: with supervision;with upper extremity assist PT Goal: Stand to Sit - Progress: Not progressing Pt will Ambulate: 51 - 150 feet;with supervision;with rolling walker  Visit Information  Last PT Received On: 09/21/12 Assistance Needed: +1    Subjective Data  Subjective: Pt reports pain in Rt ankle is worse today   Cognition  Overall Cognitive Status: Impaired Area of Impairment: Safety/judgement;Problem solving Arousal/Alertness: Awake/alert Orientation Level: Appears intact for tasks assessed Behavior During Session: [facility] for tasks performed Safety/Judgement: Decreased awareness of need for assistance Safety/Judgement - Other Comments: v/c's for safe turning with RW to sit in chair Cognition - Other Comments: pt with delayed processing. unclear of baseline pt cognition. concern for pt abiltiy to care for self : pts with seizure disorder and has had 2 admissions in last week    Balance     End of Session PT - End of Session Equipment Utilized During Treatment: Gait belt Activity Tolerance: Patient limited by pain Patient left: in chair;with call bell/phone within reach;with nursing in room Nurse Communication: Mobility status;Patient requests pain meds     Verdell Face, [place] [phone] 09/21/2012

## 2012-09-21 NOTE — Clinical Social Work Note (Signed)
Clinical Social Work   CSW provided bed offers. Pt shared that he would like this CSW to follow up with brother for assistance on choosing a facility. CSW contacted brother and he will follow up with this CSW when he arrives on the unit. CSW will continue to follow.  Dede Query, MSW, Theresia Majors [phone]

## 2012-09-22 ENCOUNTER — Inpatient Hospital Stay (HOSPITAL_COMMUNITY): Payer: Medicare Other

## 2012-09-22 DIAGNOSIS — N390 Urinary tract infection, site not specified: Secondary | ICD-10-CM

## 2012-09-22 LAB — CBC
Platelets: 189 10*3/uL (ref 150–400)
RBC: 4.14 MIL/uL — ABNORMAL LOW (ref 4.22–5.81)
RDW: 14.2 % (ref 11.5–15.5)
WBC: 7.5 10*3/uL (ref 4.0–10.5)

## 2012-09-22 LAB — BASIC METABOLIC PANEL
CO2: 21 mEq/L (ref 19–32)
Chloride: 99 mEq/L (ref 96–112)
GFR calc Af Amer: >90 mL/min (ref >90)
Potassium: 4.2 mEq/L (ref 3.5–5.1)

## 2012-09-22 MED ORDER — GADOBENATE DIMEGLUMINE 529 MG/ML IV SOLN
15.0000 mL | Freq: Once | INTRAVENOUS | Status: AC | PRN
  Administered 2012-09-22: 15 mL via INTRAVENOUS

## 2012-09-22 MED ORDER — LIDOCAINE HCL (PF) 1 % IJ SOLN
2.0000 mL | Freq: Once | INTRAMUSCULAR | Status: DC
  Filled 2012-09-22: qty 2

## 2012-09-22 MED ORDER — TRIAMCINOLONE ACETONIDE 40 MG/ML IJ SUSP
40.0000 mg | Freq: Once | INTRAMUSCULAR | Status: DC
  Filled 2012-09-22: qty 1

## 2012-09-22 NOTE — Progress Notes (Signed)
[facility] PROGRESS NOTE  KONG PACKETT AOZ:[id number] DOB: [birth year] DOA: 09/17/2012 PCP: Default, Provider, MD  Assessment/Plan: Principal Problem:  *Seizures with known seizure disorder Active Problems:  Hypertension  LBBB (left bundle branch block)  BPH (benign prostatic hyperplasia)  Hypokalemia  Lactic acidosis  Encephalopathy  Rib fracture  UTI (lower urinary tract infection)    1. Seizures with known seizure disorder: Patient has a known history of seizure disorder, for which he was on Dilantin therapy, although compliance is some what suspect. He is s/p recent hospitalization at the [facility], for Dilantin toxicity. He presented on this occasion with recurrent seizures, and a subtherapeutic Dilantin level. Seizure activity was addressed with iv Keppra, with satisfactory clinical response. Neurology consultation was provided by Dr. Ritta Slot. Patient has had no further seizure activity since admission and brain MRI was unrevealing for any acute intracranial abnormality. Patient has been transitioned to PO Keppra, effective 09/19/12, and has remained stable.  2. Encephalopathy: Patient appeared to have acute encephalopathy, precipitated by recurrent seizure activity some of which appears to not have been tonic-clonic at times. MRI did reveal extensive chronic right temporal lobe encephalomalacia, less pronounced right parietal and occipital lobe encephalomalacia. Radiologist advises cerebellar volume loss may relate to chronic Dilantin therapy, and neurologist recommends permanent discontinuation of Dilantin. This has been communicated to patient. As of 09/19/12, mental status was back to baseline.  3. Hypokalemia: Repleted as indicated.  4. Lactic acidosis: Lactic acid was 4.6 at presentation with a normal BUN and creatinine and could be related to dehydration, as well as recurrent seizure activity. Managed with iv fluids, and as described above, with resolution.  Lactic acid was normal at 0.7 on 09/19/12. IV fluids have since been discontinued.  5. Left side chest pain/Rib fractures: Patient was found to have some tenderness over left lateral rib cage and evidence of old bruising. CXR demonstrated minimally displaced posterior left 11th rib fracture and a possible fracture also of the posterior 10th rib. 6. Hypertension: BP has remained well controlled, during this hospitalization. Continue Metoprolol/Nifedipine SR.  7. LBBB (left bundle branch block: This was incidentally noted on 12-lead EKG and is chronic.  8. BPH (benign prostatic hyperplasia): Stable/Not problematic.   9. Hiatal Hernia/GERD: Asymptomatic.  10. RLE pain: Patient complained of RLE pain, on attempts at weight bearing on 09/19/12. X-Ray demonstrated remote fibular fracture. An upper fracture at the junction of the middle and distal thirds the fibula  displays nonunion. A lower fracture just above the lateral malleolus has healed in a position of deformity and angulation. Managing with analgesics, pain continues to militate against effective physiotherapy. Examination of patient's right leg on 09/21/12, revealed ankle tenderness, as well as pain to flexion and extension of ankle joint. Suspect soft tissue injury, during seizures and fall. Have ordered MRI, to evaluate foot and ankle anatomy. If MRI is unrevealing may need emipiric treatment for possible gout +/- orthopedic boot. Uric acid levels is normal.  11. Fever/UTI: Patient spiked a pyrexia of 101 degrees Farenheit on 09/19/12, necessitating septic w/u with U/A and cultures, CXR, blood cultures. Empiric Zosyn was commenced on that date, as aspiration was deemed a possible etiology. CXR revealed mild accentuation of the pulmonary markings, which may be chronic. Atelectasis is seen in the left lung base, adjacent to a hiatal hernia. Urinalysis was unremarkable, but urine culture grew enterococcus species. As of 09/20/12, patient has defervesced, and  blood cultures have remained negative. Transitioned to oral Ampicillin from 09/20/12, to be completed  on 09/24/12.       Code Status: Full Code.  Family Communication:  Disposition Plan: To be determined. Aiming discharge to SNF on 09/24/12.   Brief narrative: 67 year old male with history of HTN, BPH, Hiatal hernia, CAD s/p CABG, Chronic LBBB, previous CVA, seizure disorder who was admitted at [facility] in the last week and released on 09/16/12.  for seizures, he was found to have elevated Dilantin levels. Patient was apparently normal at that time of release from [facility] on 09/16/12, however progressively became confused after arriving home. In the ED patient was noted to be combative and very agitated requiring restraints. He was given 2 mg of Ativan, 2 mg of Versed. CT head was obtained and subsequently patient received phenobarbital 65 mg. After that patient became more alert and awake and responsive. Patient's grandson had reported that 1-2 hours before coming to the [facility] ED patient had a seizure. Also family reported that patient has auras before the seizure and he keeps 'popping' Dilantin. Patient was admitted for for further management.   Consultants:  Dr Ritta Slot, neurologist.   Procedures:  Head CT scan.  Brain MRI.   CXR.   Antibiotics:  N/A.   HPI/Subjective: No new issues.  Objective: Vital signs in last 24 hours: Temp:  [98 F (36.7 C)-99.3 F (37.4 C)] 98 F (36.7 C) (01/18 0617) Pulse Rate:  [65-73] 66  (01/18 0617) Resp:  [16-20] 18  (01/18 0617) BP: (115-147)/(64-94) 147/94 mmHg (01/18 0617) SpO2:  [98 %-100 %] 100 % (01/18 0617) Weight change:  Last BM Date: 09/20/12  Intake/Output from previous day: 01/17 0701 - 01/18 0700 In: -  Out: 450 [Urine:450] Total I/O In: -  Out: 200 [Urine:200]   Physical Exam: General: Comfortable, alert, communicative, fully oriented, not short of breath at rest.  HEENT:  No clinical pallor, no  jaundice, no conjunctival injection or discharge. Hydration is satisfactory.  NECK:  Supple, JVP not seen, no carotid bruits, no palpable lymphadenopathy, no palpable goiter. CHEST:  Clinically clear to auscultation, no wheezes, no crackles. HEART:  Sounds 1 and 2 heard, normal, regular, no murmurs. ABDOMEN:  Full, soft, non-tender, no palpable organomegaly, no palpable masses, normal bowel sounds. GENITALIA:  Not examined. LOWER EXTREMITIES:  No pitting edema, palpable peripheral pulses.  MUSCULOSKELETAL SYSTEM:  Has deformity of right ankle, due to previous injury, otherwise, generalized osteoarthritic changes. Examination of patient's right leg today, revealed ankle tenderness, as well as pain to flexion and extension of ankle joint.  CENTRAL NERVOUS SYSTEM:  No focal neurologic deficit on gross examination.  Lab Results:  [facility] 09/22/12 0605 09/21/12 0640  WBC 7.5 6.9  HGB 11.0* 10.8*  HCT 33.7* 33.0*  PLT 189 164    Basename 09/22/12 0605 09/21/12 0640  NA 135 140  K 4.2 3.7  CL 99 105  CO2 21 24  GLUCOSE 101* 105*  BUN 19 15  CREATININE 0.86 0.89  CALCIUM 9.1 9.1   Recent Results (from the past 240 hour(s))  URINE CULTURE     Status: Normal   Collection Time   09/17/12  7:22 PM      Component Value Range Status Comment   Specimen Description URINE, CATHETERIZED   Final    Special Requests NONE   Final    Culture  Setup Time 09/17/2012 20:34   Final    Colony Count NO GROWTH   Final    Culture NO GROWTH   Final    Report Status  09/18/2012 FINAL   Final   MRSA PCR SCREENING     Status: Normal   Collection Time   09/18/12 12:40 AM      Component Value Range Status Comment   MRSA by PCR NEGATIVE  NEGATIVE Final   URINE CULTURE     Status: Normal   Collection Time   09/19/12  7:37 AM      Component Value Range Status Comment   Specimen Description URINE, CATHETERIZED   Final    Special Requests NONE   Final    Culture  Setup Time 09/19/2012 08:53   Final    Colony  Count >=100,000 COLONIES/ML   Final    Culture ENTEROCOCCUS SPECIES   Final    Report Status 09/21/2012 FINAL   Final    Organism ID, Bacteria ENTEROCOCCUS SPECIES   Final   CULTURE, BLOOD (ROUTINE X 2)     Status: Normal (Preliminary result)   Collection Time   09/19/12  4:00 PM      Component Value Range Status Comment   Specimen Description BLOOD HAND LEFT   Final    Special Requests BOTTLES DRAWN AEROBIC AND ANAEROBIC 10CC   Final    Culture  Setup Time 09/19/2012 20:50   Final    Culture     Final    Value:        BLOOD CULTURE RECEIVED NO GROWTH TO DATE CULTURE WILL BE HELD FOR 5 DAYS BEFORE ISSUING A FINAL NEGATIVE REPORT   Report Status PENDING   Incomplete   CULTURE, BLOOD (ROUTINE X 2)     Status: Normal (Preliminary result)   Collection Time   09/19/12  4:15 PM      Component Value Range Status Comment   Specimen Description BLOOD ARM RIGHT   Final    Special Requests BOTTLES DRAWN AEROBIC AND ANAEROBIC 10CC   Final    Culture  Setup Time 09/19/2012 20:50   Final    Culture     Final    Value:        BLOOD CULTURE RECEIVED NO GROWTH TO DATE CULTURE WILL BE HELD FOR 5 DAYS BEFORE ISSUING A FINAL NEGATIVE REPORT   Report Status PENDING   Incomplete   URINE CULTURE     Status: Normal   Collection Time   09/19/12  6:20 PM      Component Value Range Status Comment   Specimen Description URINE, CATHETERIZED   Final    Special Requests NONE   Final    Culture  Setup Time 09/19/2012 18:47   Final    Colony Count >=100,000 COLONIES/ML   Final    Culture ENTEROCOCCUS SPECIES   Final    Report Status 09/21/2012 FINAL   Final    Organism ID, Bacteria ENTEROCOCCUS SPECIES   Final      Studies/Results: No results found.  Medications: Scheduled Meds:    . albuterol  2.5 mg Nebulization Once  . ampicillin  500 mg Oral Q6H  . aspirin  81 mg Oral Daily  . dorzolamide-timolol  1 drop Both Eyes BID  . enoxaparin (LOVENOX) injection  40 mg Subcutaneous Q24H  . finasteride  5 mg  Oral Daily  . latanoprost  1 drop Both Eyes QHS  . levETIRAcetam  500 mg Oral BID  . metoprolol tartrate  25 mg Oral BID  . multivitamin with minerals  1 tablet Oral Daily  . NIFEdipine  90 mg Oral Daily  . pantoprazole  40 mg  Oral Daily  . polyvinyl alcohol  1 drop Both Eyes 5 X Daily  . terazosin  10 mg Oral QHS  . traMADol  50 mg Oral TID   Continuous Infusions:  PRN Meds:.acetaminophen, acetaminophen, albuterol, alum & mag hydroxide-simeth, HYDROcodone-acetaminophen, HYDROmorphone (DILAUDID) injection, ibuprofen, ondansetron (ZOFRAN) IV, ondansetron    LOS: 5 days   Rishita Petron,CHRISTOPHER  [facility] Pager [phone]. If 8PM-8AM, please contact night-coverage at [email or web address], password [facility] 09/22/2012, 11:16 AM  LOS: 5 days

## 2012-09-22 NOTE — Progress Notes (Signed)
Orthopedic Tech Progress Note Patient Details:  [name] 1946-07-09 [id number]  Ortho Devices Type of Ortho Device: CAM walker Ortho Device/Splint Location: right foot Ortho Device/Splint Interventions: Application   Pippa Hanif 09/22/2012, 8:02 PM

## 2012-09-22 NOTE — Progress Notes (Signed)
While pt was in MRI for scan pt requested to see a Podiatrist to cut his toe nails while he was here. I told pt I would document his request.

## 2012-09-22 NOTE — Consult Note (Addendum)
Reason for Consult:  Right ankle pain Referring Physician:  Dr. Norm Parcel is an 67 y.o. male.  HPI: 67 y/o male with PMH of seizure disorder fell last week during a sz.  After he was admitted and sz were controlled, the patient c/o right ankle pain.  He has moderate to severe pain with WB and feels better when he's off his foot.  He has a h/o open tib fib fx sustained when he was in the [place] in [place].  He was treated in a cast resulting in significant deformity at the right leg.  He has had chronic lymphedema and has worn compression stockings.  He is not a smoker and is not diabetic.  He points to the anterior ankle as the worst area of his pain.  Past Medical History  Diagnosis Date  . Hypertension   . BPH (benign prostatic hyperplasia)   . Syncope and collapse 08/07/2012    "w/loss of consciousness; found down; don't know how long he was out" (08/07/2012)  . Heart murmur     "don't exist now" (08/07/2012)  . H/O hiatal hernia   . Seizures     "h/o gran mal & petit" (08/07/2012)  . Stroke 1980's    "mini w/seizure at the same time" (08/07/2012)  . Coronary artery disease   . LBBB (left bundle branch block)     Past Surgical History  Procedure Date  . Coronary artery bypass graft ~ 2010    CABG X1  . Tibia fracture surgery 1960's    RLE    FH:  Neg for any bone or joint disease.  Social History:  reports that he has never smoked. He has never used smokeless tobacco. He reports that he does not drink alcohol or use illicit drugs.  Allergies:  Allergies  Allergen Reactions  . Codeine Rash    Medications: I have reviewed the patient's current medications.  Results for orders placed during the hospital encounter of 09/17/12 (from the past 48 hour(s))  CBC     Status: Abnormal   Collection Time   09/21/12  6:40 AM      Component Value Range Comment   WBC 6.9  4.0 - 10.5 K/uL    RBC 4.08 (*) 4.22 - 5.81 MIL/uL    Hemoglobin 10.8 (*) 13.0 - 17.0 g/dL    HCT 40.9 (*) 81.1 - 52.0 %    MCV 80.9  78.0 - 100.0 fL    MCH 26.5  26.0 - 34.0 pg    MCHC 32.7  30.0 - 36.0 g/dL    RDW 91.4  78.2 - 95.6 %    Platelets 164  150 - 400 K/uL   BASIC METABOLIC PANEL     Status: Abnormal   Collection Time   09/21/12  6:40 AM      Component Value Range Comment   Sodium 140  135 - 145 mEq/L    Potassium 3.7  3.5 - 5.1 mEq/L    Chloride 105  96 - 112 mEq/L    CO2 24  19 - 32 mEq/L    Glucose, Bld 105 (*) 70 - 99 mg/dL    BUN 15  6 - 23 mg/dL    Creatinine, Ser 2.13  0.50 - 1.35 mg/dL    Calcium 9.1  8.4 - 08.6 mg/dL    GFR calc non Af Amer 86 (*) >90 mL/min    GFR calc Af Amer >90  >90 mL/min   GLUCOSE,  CAPILLARY     Status: Abnormal   Collection Time   09/21/12  4:41 PM      Component Value Range Comment   Glucose-Capillary 117 (*) 70 - 99 mg/dL   URIC ACID     Status: Normal   Collection Time   09/21/12  6:44 PM      Component Value Range Comment   Uric Acid, Serum 4.6  4.0 - 7.8 mg/dL   CBC     Status: Abnormal   Collection Time   09/22/12  6:05 AM      Component Value Range Comment   WBC 7.5  4.0 - 10.5 K/uL    RBC 4.14 (*) 4.22 - 5.81 MIL/uL    Hemoglobin 11.0 (*) 13.0 - 17.0 g/dL    HCT 09.8 (*) 11.9 - 52.0 %    MCV 81.4  78.0 - 100.0 fL    MCH 26.6  26.0 - 34.0 pg    MCHC 32.6  30.0 - 36.0 g/dL    RDW 14.7  82.9 - 56.2 %    Platelets 189  150 - 400 K/uL   BASIC METABOLIC PANEL     Status: Abnormal   Collection Time   09/22/12  6:05 AM      Component Value Range Comment   Sodium 135  135 - 145 mEq/L    Potassium 4.2  3.5 - 5.1 mEq/L    Chloride 99  96 - 112 mEq/L    CO2 21  19 - 32 mEq/L    Glucose, Bld 101 (*) 70 - 99 mg/dL    BUN 19  6 - 23 mg/dL    Creatinine, Ser 1.30  0.50 - 1.35 mg/dL    Calcium 9.1  8.4 - 86.5 mg/dL    GFR calc non Af Amer 87 (*) >90 mL/min    GFR calc Af Amer >90  >90 mL/min     Mr Foot Right W Wo Contrast  09/22/2012  *RADIOLOGY REPORT*  Clinical Data: Ankle and heel pain.  Old fractures of the tibia  and fibula with nonunion of the midportion of the right fibula.  MRI OF THE RIGHT FOREFOOT WITHOUT AND WITH CONTRAST  Technique:  Multiplanar, multisequence MR imaging was performed both before and after administration of intravenous contrast.  Contrast: 15mL MULTIHANCE GADOBENATE DIMEGLUMINE 529 MG/ML IV SOLN  Comparison: Radiographs dated 07/20/2013  Findings: There are slight arthritic changes of the head of the first metatarsal.  The other bones of the forefoot appear normal.  There is abnormal enhancement around the second metatarsal phalangeal joint.  There is no joint effusion.  No bone destruction.   There is prominent edema in the subcutaneous tissues of the forefoot primarily dorsal.  No abscesses.  IMPRESSION: Focal inflammation around the second metatarsal phalangeal joint. No osteomyelitis, abscess, or joint effusion.  This could represent capsulitis with adjacent inflammation.  The appearance is nonspecific.   Original Report Authenticated By: Francene Boyers, M.D.    Mr Ankle Right W Wo Contrast  09/22/2012  * RADIOLOGY REPORT *  Clinical Data:  Right ankle and heel pain.  Old fractures of the shafts of the tibia and fibula.  MRI OF THE RIGHT ANKLE WITHOUT AND WITH CONTRAST  Technique: Multiplanar, multisequence MR imaging of the right ankle was performed following the administration of intravenous contrast.  Comparison:  Radiographs dated 09/19/2012  Contrast:  15mL MULTIHANCE GADOBENATE DIMEGLUMINE 529 MG/ML IV SOLN  FINDINGS:  TENDONS Peroneal:  Normal. Posteromedial:  Normal. Anterior:  Normal. Achilles: Normal.  Plantar Fascia: Normal.  LIGAMENTS Lateral: Normal. Medial: Normal.  CARTILAGE Ankle Joint: Extensive grade 4 chondromalacia with subchondral cyst formation in the medial lateral aspects of the dome of the talus and adjacent tibia.  Marked cartilage thinning of the posterior aspect of the ankle joint. Subtalar Joints/Sinus Tarsi: Normal. Bones: Prominent osteoarthritis of the ankle  joint.  Focal areas of osteoarthritis at the calcaneal cuboid joint and between the navicular and middle and lateral cuneiforms.  Slight arthritis at the talonavicular joint.  IMPRESSION: Osteoarthritis of the ankle joint.  Diffuse prominent subcutaneous edema in the lower leg and foot, nonspecific.   Original Report Authenticated By: Francene Boyers, M.D.     ROS:  No recent f/c/n/v/wt loss.  Sz as above PE:  Blood pressure 130/64, pulse 80, temperature 98.5 F (36.9 C), temperature source Oral, resp. rate 18, height 5\' 6"  (1.676 m), weight 71.9 kg (158 lb 8.2 oz), SpO2 96.00%. wn wd male in nad.  Poor dentition. EOMI.  Resp unlabored.  A and O x 4.  Mood and affect normal.  R leg with chronic lymphdema and trophic skin changes.  5/5 strength in DF and PF of the ankle.  Varus malalignment of the ankle.  1+ dp and pt pulses.  Diffusely TTP around the ankle particular anteromedially.  Sens to LT intact about the ankle.  No lymphadenopathy.  Large intractable plantar keratosis under the 5th MT head.  Lesser toe hammertoe deformities.  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 - this alignment likely as caused the ankle arthritis which is now flared up after his fall last week.  R ankle arthritis - I believe his pain is the result of a fall aggravating his right ankle arthritis.  I think he'd benefit from a cam boot to help with ambulation and a steroid shot to help alleviate his pain.  R plantar keratosis - trim this IPK to alleviate pressure related pain.  I'll order the steroid and see him back later this evening.  Toni Arthurs 09/22/2012, 7:13 PM     Procedure:  After informed consent and sterile prep, I injected 1 cc of kenalog and 2 cc of lidocaine into the ankle joint through an anterolateral approach.  He tolerated the procedure well, and there were no evident complications.  I'll check back on him tomorrow or Monday.  He can bear weight as tolerated and  follow up with the [facility] orth[facility] clinic for further treatment of this problem.  He is established in that clinic and has been treated there for several years for this condition.

## 2012-09-23 MED ORDER — POLYETHYLENE GLYCOL 3350 17 G PO PACK
17.0000 g | PACK | Freq: Every day | ORAL | Status: DC | PRN
  Filled 2012-09-23: qty 1

## 2012-09-23 NOTE — Progress Notes (Signed)
[facility] PROGRESS NOTE  ROCHELL MABIE ZOX:[id number] DOB: 28-Aug-1946 DOA: 09/17/2012 PCP: Default, Provider, MD  Assessment/Plan: Principal Problem:  *Seizures with known seizure disorder Active Problems:  Hypertension  LBBB (left bundle branch block)  BPH (benign prostatic hyperplasia)  Hypokalemia  Lactic acidosis  Encephalopathy  Rib fracture  UTI (lower urinary tract infection)    1. Seizures with known seizure disorder: Patient has a known history of seizure disorder, for which he was on Dilantin therapy, although compliance is some what suspect. He is s/p recent hospitalization at the [facility], for Dilantin toxicity. He presented on this occasion with recurrent seizures, and a subtherapeutic Dilantin level. Seizure activity was addressed with iv Keppra, with satisfactory clinical response. Neurology consultation was provided by Dr. Ritta Slot. Patient has had no further seizure activity since admission and brain MRI was unrevealing for any acute intracranial abnormality. Patient has been transitioned to PO Keppra, effective 09/19/12, and has remained stable.  2. Encephalopathy: Patient appeared to have acute encephalopathy, precipitated by recurrent seizure activity some of which appears to not have been tonic-clonic at times. MRI did reveal extensive chronic right temporal lobe encephalomalacia, less pronounced right parietal and occipital lobe encephalomalacia. Radiologist advises cerebellar volume loss may relate to chronic Dilantin therapy, and neurologist recommends permanent discontinuation of Dilantin. This has been communicated to patient. As of 09/19/12, mental status was back to baseline.  3. Hypokalemia: Repleted as indicated.  4. Lactic acidosis: Lactic acid was 4.6 at presentation with a normal BUN and creatinine and could be related to dehydration, as well as recurrent seizure activity. Managed with iv fluids, and as described above, with resolution.  Lactic acid was normal at 0.7 on 09/19/12. IV fluids have since been discontinued.  5. Left side chest pain/Rib fractures: Patient was found to have some tenderness over left lateral rib cage and evidence of old bruising. CXR demonstrated minimally displaced posterior left 11th rib fracture and a possible fracture also of the posterior 10th rib. 6. Hypertension: BP has remained well controlled, during this hospitalization. Continue Metoprolol/Nifedipine SR.  7. LBBB (left bundle branch block: This was incidentally noted on 12-lead EKG and is chronic.  8. BPH (benign prostatic hyperplasia): Stable/Not problematic.   9. Hiatal Hernia/GERD: Asymptomatic.  10. RLE pain: Patient complained of RLE pain, on attempts at weight bearing on 09/19/12. X-Ray demonstrated remote fibular fracture. An upper fracture at the junction of the middle and distal thirds the fibula  displays nonunion. A lower fracture just above the lateral malleolus has healed in a position of deformity and angulation. Managing with analgesics, pain continues to militate against effective physiotherapy. Examination of patient's right leg on 09/21/12, revealed ankle tenderness, as well as pain to flexion and extension of ankle joint. Suspect soft tissue injury, during seizures and fall. Uric acid level is normal. MRI of right foot/ankle, revealed osteoarthritis of the ankle joint as well as focal inflammation around the second metatarsal phalangeal joint, but no osteomyelitis, abscess, or joint effusion. This could represent capsulitis with adjacent inflammation. 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 He performed intra-articular steroid injection, and has recommended CAM boot. Patient wil follow up with his primary orthopedic surgeon at [facility].  11. Fever/UTI: Patient spiked a pyrexia of 101 degrees Farenheit  on 09/19/12, necessitating septic w/u with U/A and cultures, CXR, blood cultures. Empiric Zosyn was commenced on that date, as aspiration was  deemed a possible etiology. CXR revealed mild accentuation of the pulmonary markings, which may be chronic. Atelectasis is seen in the left lung base, adjacent to a hiatal hernia. Urinalysis was unremarkable, but urine culture grew enterococcus species. As of 09/20/12, patient has defervesced, and blood cultures have remained negative. Transitioned to oral Ampicillin from 09/20/12, to be completed on 09/24/12.       Code Status: Full Code.  Family Communication:  Disposition Plan: To be determined. Aiming discharge to SNF on 09/24/12.   Brief narrative: 67 year old male with history of HTN, BPH, Hiatal hernia, CAD s/p CABG, Chronic LBBB, previous CVA, seizure disorder who was admitted at [facility] in the last week and released on 09/16/12.  for seizures, he was found to have elevated Dilantin levels. Patient was apparently normal at that time of release from [facility] on 09/16/12, however progressively became confused after arriving home. In the ED patient was noted to be combative and very agitated requiring restraints. He was given 2 mg of Ativan, 2 mg of Versed. CT head was obtained and subsequently patient received phenobarbital 65 mg. After that patient became more alert and awake and responsive. Patient's grandson had reported that 1-2 hours before coming to the [facility] ED patient had a seizure. Also family reported that patient has auras before the seizure and he keeps 'popping' Dilantin. Patient was admitted for for further management.   Consultants:  Dr Ritta Slot, neurologist.   Procedures:  Head CT scan.  Brain MRI.   CXR.   Antibiotics:  N/A.   HPI/Subjective: No new issues.  Objective: Vital signs in last 24 hours: Temp:  [98.4 F (36.9 C)-98.5 F (36.9 C)] 98.4 F (36.9 C) (01/19 0600) Pulse Rate:  [62-80] 62  (01/19  0600) Resp:  [18-20] 20  (01/19 0600) BP: (126-133)/(52-68) 126/52 mmHg (01/19 0600) SpO2:  [95 %-97 %] 95 % (01/19 0600) Weight change:  Last BM Date: 09/20/12  Intake/Output from previous day: 01/18 0701 - 01/19 0700 In: -  Out: 200 [Urine:200]     Physical Exam: General: Comfortable, alert, communicative, fully oriented, not short of breath at rest.  HEENT:  No clinical pallor, no jaundice, no conjunctival injection or discharge. Hydration is satisfactory.  NECK:  Supple, JVP not seen, no carotid bruits, no palpable lymphadenopathy, no palpable goiter. CHEST:  Clinically clear to auscultation, no wheezes, no crackles. HEART:  Sounds 1 and 2 heard, normal, regular, no murmurs. ABDOMEN:  Full, soft, non-tender, no palpable organomegaly, no palpable masses, normal bowel sounds. GENITALIA:  Not examined. LOWER EXTREMITIES:  No pitting edema, palpable peripheral pulses.  MUSCULOSKELETAL SYSTEM:  Has deformity of right ankle, due to previous injury, otherwise, generalized osteoarthritic changes.  CENTRAL NERVOUS SYSTEM:  No focal neurologic deficit on gross examination.  Lab Results:  [facility] 09/22/12 0605 09/21/12 0640  WBC 7.5 6.9  HGB 11.0* 10.8*  HCT 33.7* 33.0*  PLT 189 164    Basename 09/22/12 0605 09/21/12 0640  NA 135 140  K 4.2 3.7  CL 99 105  CO2 21 24  GLUCOSE 101* 105*  BUN 19 15  CREATININE 0.86 0.89  CALCIUM 9.1 9.1   Recent Results (from the past 240 hour(s))  URINE CULTURE     Status: Normal   Collection Time   09/17/12  7:22 PM      Component Value Range Status Comment   Specimen Description URINE, CATHETERIZED   Final    Special Requests NONE   Final  Culture  Setup Time 09/17/2012 20:34   Final    Colony Count NO GROWTH   Final    Culture NO GROWTH   Final    Report Status 09/18/2012 FINAL   Final   MRSA PCR SCREENING     Status: Normal   Collection Time   09/18/12 12:40 AM      Component Value Range Status Comment   MRSA by PCR NEGATIVE   NEGATIVE Final   URINE CULTURE     Status: Normal   Collection Time   09/19/12  7:37 AM      Component Value Range Status Comment   Specimen Description URINE, CATHETERIZED   Final    Special Requests NONE   Final    Culture  Setup Time 09/19/2012 08:53   Final    Colony Count >=100,000 COLONIES/ML   Final    Culture ENTEROCOCCUS SPECIES   Final    Report Status 09/21/2012 FINAL   Final    Organism ID, Bacteria ENTEROCOCCUS SPECIES   Final   CULTURE, BLOOD (ROUTINE X 2)     Status: Normal (Preliminary result)   Collection Time   09/19/12  4:00 PM      Component Value Range Status Comment   Specimen Description BLOOD HAND LEFT   Final    Special Requests BOTTLES DRAWN AEROBIC AND ANAEROBIC 10CC   Final    Culture  Setup Time 09/19/2012 20:50   Final    Culture     Final    Value:        BLOOD CULTURE RECEIVED NO GROWTH TO DATE CULTURE WILL BE HELD FOR 5 DAYS BEFORE ISSUING A FINAL NEGATIVE REPORT   Report Status PENDING   Incomplete   CULTURE, BLOOD (ROUTINE X 2)     Status: Normal (Preliminary result)   Collection Time   09/19/12  4:15 PM      Component Value Range Status Comment   Specimen Description BLOOD ARM RIGHT   Final    Special Requests BOTTLES DRAWN AEROBIC AND ANAEROBIC 10CC   Final    Culture  Setup Time 09/19/2012 20:50   Final    Culture     Final    Value:        BLOOD CULTURE RECEIVED NO GROWTH TO DATE CULTURE WILL BE HELD FOR 5 DAYS BEFORE ISSUING A FINAL NEGATIVE REPORT   Report Status PENDING   Incomplete   URINE CULTURE     Status: Normal   Collection Time   09/19/12  6:20 PM      Component Value Range Status Comment   Specimen Description URINE, CATHETERIZED   Final    Special Requests NONE   Final    Culture  Setup Time 09/19/2012 18:47   Final    Colony Count >=100,000 COLONIES/ML   Final    Culture ENTEROCOCCUS SPECIES   Final    Report Status 09/21/2012 FINAL   Final    Organism ID, Bacteria ENTEROCOCCUS SPECIES   Final      Studies/Results: Mr Foot  Right W Wo Contrast  09/22/2012  *RADIOLOGY REPORT*  Clinical Data: Ankle and heel pain.  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  MRI OF THE RIGHT FOREFOOT WITHOUT AND WITH CONTRAST  Technique:  Multiplanar, multisequence MR imaging was performed both before and after administration of intravenous contrast.  Contrast: 15mL MULTIHANCE GADOBENATE DIMEGLUMINE 529 MG/ML IV SOLN  Comparison: Radiographs dated 07/20/2013  Findings: There are slight arthritic changes of the head of the first metatarsal.  The other bones of the forefoot appear normal.  There is abnormal enhancement around the second metatarsal phalangeal joint.  There is no joint effusion.  No bone destruction.   There is prominent edema in the subcutaneous tissues of the forefoot primarily dorsal.  No abscesses.  IMPRESSION: Focal inflammation around the second metatarsal phalangeal joint. No osteomyelitis, abscess, or joint effusion.  This could represent capsulitis with adjacent inflammation.  The appearance is nonspecific.   Original Report Authenticated By: Francene Boyers, M.D.    Mr Ankle Right W Wo Contrast  09/22/2012  * RADIOLOGY REPORT *  Clinical Data:  Right ankle and heel pain.  Old fractures of the shafts of the tibia and fibula.  MRI OF THE RIGHT ANKLE WITHOUT AND WITH CONTRAST  Technique: Multiplanar, multisequence MR imaging of the right ankle was performed following the administration of intravenous contrast.  Comparison:  Radiographs dated 09/19/2012  Contrast:  15mL MULTIHANCE GADOBENATE DIMEGLUMINE 529 MG/ML IV SOLN  FINDINGS:  TENDONS Peroneal:  Normal. Posteromedial:  Normal. Anterior:  Normal. Achilles: Normal.  Plantar Fascia: Normal.  LIGAMENTS Lateral: Normal. Medial: Normal.  CARTILAGE Ankle Joint: Extensive grade 4 chondromalacia with subchondral cyst formation in the medial lateral aspects of the dome of the talus and adjacent tibia.  Marked cartilage thinning of the  posterior aspect of the ankle joint. Subtalar Joints/Sinus Tarsi: Normal. Bones: Prominent osteoarthritis of the ankle joint.  Focal areas of osteoarthritis at the calcaneal cuboid joint and between the navicular and middle and lateral cuneiforms.  Slight arthritis at the talonavicular joint.  IMPRESSION: Osteoarthritis of the ankle joint.  Diffuse prominent subcutaneous edema in the lower leg and foot, nonspecific.   Original Report Authenticated By: Francene Boyers, M.D.     Medications: Scheduled Meds:    . albuterol  2.5 mg Nebulization Once  . ampicillin  500 mg Oral Q6H  . aspirin  81 mg Oral Daily  . dorzolamide-timolol  1 drop Both Eyes BID  . enoxaparin (LOVENOX) injection  40 mg Subcutaneous Q24H  . finasteride  5 mg Oral Daily  . latanoprost  1 drop Both Eyes QHS  . levETIRAcetam  500 mg Oral BID  . lidocaine  2 mL Other Once  . metoprolol tartrate  25 mg Oral BID  . multivitamin with minerals  1 tablet Oral Daily  . NIFEdipine  90 mg Oral Daily  . pantoprazole  40 mg Oral Daily  . polyvinyl alcohol  1 drop Both Eyes 5 X Daily  . terazosin  10 mg Oral QHS  . traMADol  50 mg Oral TID  . triamcinolone acetonide  40 mg Intramuscular Once   Continuous Infusions:  PRN Meds:.acetaminophen, acetaminophen, albuterol, alum & mag hydroxide-simeth, HYDROcodone-acetaminophen, HYDROmorphone (DILAUDID) injection, ibuprofen, ondansetron (ZOFRAN) IV, ondansetron, polyethylene glycol    LOS: 6 days   Aysel Gilchrest,CHRISTOPHER  [facility] Pager [phone]. If 8PM-8AM, please contact night-coverage at [email or web address], password [facility] 09/23/2012, 12:25 PM  LOS: 6 days

## 2012-09-24 LAB — CREATININE, SERUM
Creatinine, Ser: 0.92 mg/dL (ref 0.50–1.35)
GFR calc Af Amer: >90 mL/min (ref >90)
GFR calc non Af Amer: 85 mL/min — ABNORMAL LOW (ref >90)

## 2012-09-24 MED ORDER — AMPICILLIN 500 MG PO CAPS: 500.0000 mg | ORAL_CAPSULE | Freq: Four times a day (QID) | ORAL | Status: AC

## 2012-09-24 MED ORDER — LEVETIRACETAM 500 MG PO TABS: 500.0000 mg | ORAL_TABLET | Freq: Two times a day (BID) | ORAL | Status: DC

## 2012-09-24 MED ORDER — HYDROCODONE-ACETAMINOPHEN 5-325 MG PO TABS: 1.0000 | ORAL_TABLET | ORAL | Status: DC | PRN

## 2012-09-24 MED ORDER — POLYETHYLENE GLYCOL 3350 17 G PO PACK: 17.0000 g | PACK | Freq: Every day | ORAL | Status: DC | PRN

## 2012-09-24 NOTE — Progress Notes (Signed)
Physical Therapy Treatment Patient Details Name: RIGEL FILSINGER MRN: [id number] DOB: [birth year] Today's Date: 09/24/2012 Time: 1478-2956 PT Time Calculation (min): 18 min  PT Assessment / Plan / Recommendation Comments on Treatment Session  Pt utilized Cam Boot RLE for OOB activity today.  Able to ambulate ~50' with RW this session.      Follow Up Recommendations  SNF;Supervision/Assistance - 24 hour     Does the patient have the potential to tolerate intense rehabilitation     Barriers to Discharge        Equipment Recommendations  Rolling walker with 5" wheels    Recommendations for Other Services    Frequency Min 3X/week   Plan Discharge plan remains appropriate    Precautions / Restrictions Precautions Precautions: Fall Required Braces or Orthoses: Other Brace/Splint Other Brace/Splint: Cam Boot Rt LE Restrictions Weight Bearing Restrictions: No   Pertinent Vitals/Pain 6/10 Rt ankle but tolerated OOB activity with Cam Boot.      Mobility  Bed Mobility Bed Mobility: Supine to Sit;Sitting - Scoot to Edge of Bed Supine to Sit: 6: Modified independent (Device/Increase time);HOB flat Sitting - Scoot to Edge of Bed: 6: Modified independent (Device/Increase time) Transfers Transfers: Sit to Stand;Stand to Sit Sit to Stand: 4: Min assist;With upper extremity assist;From bed Stand to Sit: 4: Min guard;With upper extremity assist;With armrests;To chair/3-in-1 Details for Transfer Assistance: Cues for hand placement & safe use of RW when turning around to recliner.  (A) to achieve standing, balance.   Ambulation/Gait Ambulation/Gait Assistance: 4: Min assist Ambulation Distance (Feet): 50 Feet Assistive device: Rolling walker Ambulation/Gait Assistance Details: Pt ambulated using cam boot Rt LE.  Cues for safe RW advancement & body positiioning inside RW.  (A) for RW management & minor (A) for balance initially Gait Pattern: Step-through pattern Stairs: No Wheelchair  Mobility Wheelchair Mobility: No     PT Goals Acute Rehab PT Goals Time For Goal Achievement: 10/03/12 Potential to Achieve Goals: Good Pt will go Supine/Side to Sit: with modified independence;with HOB 0 degrees PT Goal: Supine/Side to Sit - Progress: Met Pt will go Sit to Supine/Side: with modified independence;with HOB 0 degrees Pt will go Sit to Stand: with supervision;with upper extremity assist PT Goal: Sit to Stand - Progress: Not met Pt will go Stand to Sit: with supervision;with upper extremity assist PT Goal: Stand to Sit - Progress: Progressing toward goal Pt will Ambulate: 51 - 150 feet;with supervision;with rolling walker PT Goal: Ambulate - Progress: Progressing toward goal  Visit Information  Last PT Received On: 09/24/12 Assistance Needed: +1    Subjective Data      Cognition  Overall Cognitive Status: No family/caregiver present to determine baseline cognitive functioning Arousal/Alertness: Awake/alert Orientation Level: Appears intact for tasks assessed Behavior During Session: [facility] for tasks performed Safety/Judgement - Other Comments: v/c's for safe turning with RW to sit in chair Cognition - Other Comments: Pt slow to process cues.      Balance  Balance Balance Assessed: No  End of Session PT - End of Session Equipment Utilized During Treatment: Gait belt;Other (comment) (Cam Boot Rt LE) Activity Tolerance: Patient tolerated treatment well Patient left: in chair;with call bell/phone within reach;with nursing in room Nurse Communication: Mobility status;Patient requests pain meds    Verdell Face, [place] [phone] 09/24/2012

## 2012-09-24 NOTE — Clinical Social Work Placement (Signed)
     Clinical Social Work Department CLINICAL SOCIAL WORK PLACEMENT NOTE 09/24/2012  Patient:  [name], [name]  Account Number:  [id number] Admit date:  09/17/2012  Clinical Social Worker:  Peggyann Shoals  Date/time:  09/24/2012 04:01 PM  Clinical Social Work is seeking post-discharge placement for this patient at the following level of care:   SKILLED NURSING   (*CSW will update this form in Epic as items are completed)   09/19/2012  Patient/family provided with [facility] Department of Clinical Social Works list of facilities offering this level of care within the geographic area requested by the patient (or if unable, by the patients family).  09/19/2012  Patient/family informed of their freedom to choose among providers that offer the needed level of care, that participate in Medicare, Medicaid or managed care program needed by the patient, have an available bed and are willing to accept the patient.  09/19/2012  Patient/family informed of [facility] ownership interest in [facility], as well as of the fact that they are under no obligation to receive care at this facility.  PASARR submitted to EDS on 09/20/2012 PASARR number received from [employer]DS on 09/20/2012  FL2 transmitted to all facilities in geographic area requested by pt/family on  09/20/2012 FL2 transmitted to all facilities within larger geographic area on   Patient informed that his/her managed care company has contracts with or will negotiate with  certain facilities, including the following:     Patient/family informed of bed offers received:  09/21/2012 Patient chooses bed at [facility] Physician recommends and patient chooses bed at  [facility]  Patient to be transferred to [facility] on  09/24/2012 Patient to be transferred to facility by [facility]  The following physician request were entered in Epic:   Additional Comments:

## 2012-09-24 NOTE — Progress Notes (Signed)
Occupational Therapy Treatment Patient Details Name: [name] MRN: [id number] DOB: 1946-08-12 Today's Date: 09/24/2012 Time: 4098-1191 OT Time Calculation (min): 32 min  OT Assessment / Plan / Recommendation Comments on Treatment Session Pt is progressing in mobility compared to last visit, but accepting only 50% of weight on R LE.  Pt with wheezing breath sounds with exertion.      Follow Up Recommendations  SNF    Barriers to Discharge       Equipment Recommendations  None recommended by OT    Recommendations for Other Services    Frequency Min 2X/week   Plan Discharge plan remains appropriate    Precautions / Restrictions Precautions Precautions: Fall Precaution Comments: seizure precautions Required Braces or Orthoses: Other Brace/Splint Other Brace/Splint: Cam Boot Rt LE Restrictions Weight Bearing Restrictions: No   Pertinent Vitals/Pain 4/10 R, 3/10 rib pain, repositioned    ADL  Grooming: Wash/dry hands;Min guard;Wash/dry face;Brushing hair Where Assessed - Grooming: Supported standing Toilet Transfer: Hydrographic surveyor Method: Sit to Barista: Raised toilet seat with arms (or 3-in-1 over toilet) Toileting - Clothing Manipulation and Hygiene: Min guard Where Assessed - Toileting Clothing Manipulation and Hygiene: Sit to stand from 3-in-1 or toilet Equipment Used: Rolling walker;Gait belt Transfers/Ambulation Related to ADLs: Pt able to bear 50% of weight on R LE with CAM boot and use of RW.  Ambulated to bathroom and back to chair with min guard assist. ADL Comments: Instructed pt in splinting with pillow to decrease rib pain with deep breathing and coughing. Encouraged incentive spirometer.    OT Diagnosis:    OT Problem List:   OT Treatment Interventions:     OT Goals ADL Goals Pt Will Perform Grooming: with supervision;Standing at sink ADL Goal: Grooming - Progress: Progressing toward goals Pt Will Perform Lower  Body Bathing: with supervision;Sit to stand from chair;Sit to stand from bed Pt Will Perform Lower Body Dressing: with supervision;Sit to stand from chair;Sit to stand from bed Pt Will Transfer to Toilet: with supervision;Ambulation;with DME ADL Goal: Toilet Transfer - Progress: Progressing toward goals Pt Will Perform Toileting - Clothing Manipulation: with supervision;Standing ADL Goal: Toileting - Clothing Manipulation - Progress: Progressing toward goals Pt Will Perform Toileting - Hygiene: with supervision;Standing at 3-in-1/toilet ADL Goal: Toileting - Hygiene - Progress: Progressing toward goals  Visit Information  Last OT Received On: 09/24/12 Assistance Needed: +1    Subjective Data      Prior Functioning       Cognition  Overall Cognitive Status: Impaired Area of Impairment: Safety/judgement Arousal/Alertness: Awake/alert Orientation Level: Appears intact for tasks assessed Behavior During Session: [facility] for tasks performed Safety/Judgement: Decreased safety judgement for tasks assessed Safety/Judgement - Other Comments: attempts to stand without walker and assist Cognition - Other Comments: Pt slow to process cues.      Mobility  Shoulder Instructions Bed Mobility Bed Mobility: Not assessed  Transfers Transfers: Sit to Stand;Stand to Sit Sit to Stand: 4: Min guard;With upper extremity assist;From chair/3-in-1 Stand to Sit: 4: Min guard;With upper extremity assist;With armrests;To chair/3-in-1 Details for Transfer Assistance: Cues for hand placement & safe use of RW when turning around to recliner.  (A) to achieve standing, balance.         Exercises      Balance Balance Balance Assessed: No   End of Session OT - End of Session Activity Tolerance: Patient limited by pain;Patient limited by fatigue Patient left: in chair;with call bell/phone within reach  GO  [name] 09/24/2012, 12:13 PM [phone]

## 2012-09-24 NOTE — Progress Notes (Signed)
Subjective: Pt reports some continued pain in the right ankle today.  Says the boot helps with ambulation but that he hasn't really been up much.   Objective: Vital signs in last 24 hours: Temp:  [97.8 F (36.6 C)-97.9 F (36.6 C)] 97.9 F (36.6 C) (01/20 0600) Pulse Rate:  [54-63] 62  (01/20 0600) Resp:  [16-18] 18  (01/20 0600) BP: (102-127)/(50-65) 127/51 mmHg (01/20 0600) SpO2:  [93 %-98 %] 98 % (01/20 0600)  Intake/Output from previous day: 01/19 0701 - 01/20 0700 In: 720 [P.O.:720] Out: 640 [Urine:640] Intake/Output this shift:     Basename 09/22/12 0605  HGB 11.0*    Basename 09/22/12 0605  WBC 7.5  RBC 4.14*  HCT 33.7*  PLT 189    Basename 09/24/12 0645 09/22/12 0605  NA -- 135  K -- 4.2  CL -- 99  CO2 -- 21  BUN -- 19  CREATININE 0.92 0.86  GLUCOSE -- 101*  CALCIUM -- 9.1   PE:  [age] wd male resting comfortably in bed.  R ankle with gross varus deformity at the leg.    Assessment/Plan: R ankle varus malalignment s/p open right tibia fracture 45 years ago.  Pt may not have complete benefit from the steroid injection for as much as 2 weeks.  He should bear weight in the cam boot for comfort.  He can follow up with me in clinic in a month or with the [facility] ortho clinic as needed.  I'll sign off.  Please call with any questions.  [phone].   Toni Arthurs 09/24/2012, 8:55 AM

## 2012-09-24 NOTE — Clinical Social Work Note (Signed)
Clinical Social Work   CSW met with pt address discharge plan. CSW contacted pt's brother, who has chosen [facility] [employer] placement. Per pt's request, this CSW contacted pt's grandson. CSW confirmed bed availability with [facility]. CSW will continue to follow.   Dede Query, MSW, Theresia Majors [phone]

## 2012-09-24 NOTE — Clinical Social Work Note (Signed)
Clinical Social Work  Pt is ready for discharge to [facility]. Facility has received discharge summary and ready to accept pt. Pt and family are agreeable to discharge plan. [facility] will provide transportation to facility. CSW is signing off as no further needs are identified.   Dede Query, MSW, [facility]ia Majors [phone]

## 2012-09-25 LAB — CULTURE, BLOOD (ROUTINE X 2): Culture: NO GROWTH

## 2012-09-25 NOTE — Care Management Note (Signed)
    Page 1 of 1   09/25/2012     8:53:02 AM   CARE MANAGEMENT NOTE 09/25/2012  Patient:  [name], [name]   Account Number:  [id number]  Date Initiated:  09/20/2012  Documentation initiated by:  [facility]  Subjective/Objective Assessment:   Admitted with seizures     Action/Plan:   PT/OT evals-recommending SNF   Anticipated DC Date:  09/24/2012   Anticipated DC Plan:  SKILLED NURSING FACILITY  In-house referral  Clinical Social Worker      DC Planning Services  CM consult      Choice offered to / List presented to:             Status of service:  Completed, signed off Medicare Important Message given?   (If response is "NO", the following Medicare IM given date fields will be blank) Date Medicare IM given:   Date Additional Medicare IM given:    Discharge Disposition:  SKILLED NURSING FACILITY  Per UR Regulation:  Reviewed for med. necessity/level of care/duration of stay  If discussed at Long Length of Stay Meetings, dates discussed:    Comments:  09/20/12 Received call from [place] at [facility] inquiring if patient wished to transfer to [facility]. Spoke with patient and at patient's request, his brother Tawni Pummel about whether patient wished to transfer to [facility]. Patient decided not to transfer. Patient completed decision form and I faxed it to [facility].Jacquelynn Cree RN, BSN, CCM  09/21/11 Spoke with the RN at patient's PCP's  office about whether Keppra would be [facility].She requested H and P, progress notes and any scan results be faxed to PCP Dr Elpidio Galea at the [facility] at [phone]. Dr. Elpidio Galea will have to refer patent to a neurologist and if the neurologist agrees with Keppra then it can be covered. Received permission from patient and faxed requested information. Received fax confirmation. Jacquelynn Cree RN, BSN, CCM

## 2012-10-12 ENCOUNTER — Encounter (HOSPITAL_COMMUNITY): Payer: Self-pay | Admitting: Emergency Medicine

## 2012-10-12 ENCOUNTER — Emergency Department (HOSPITAL_COMMUNITY)
Admission: EM | Admit: 2012-10-12 | Discharge: 2012-10-12 | Disposition: A | Discharge status: Home / Self Care | Payer: Medicare Other | Attending: Emergency Medicine | Admitting: Emergency Medicine

## 2012-10-12 DIAGNOSIS — Z87448 Personal history of other diseases of urinary system: Secondary | ICD-10-CM | POA: Insufficient documentation

## 2012-10-12 DIAGNOSIS — R569 Unspecified convulsions: Secondary | ICD-10-CM

## 2012-10-12 DIAGNOSIS — Z7982 Long term (current) use of aspirin: Secondary | ICD-10-CM | POA: Insufficient documentation

## 2012-10-12 DIAGNOSIS — I2510 Atherosclerotic heart disease of native coronary artery without angina pectoris: Secondary | ICD-10-CM | POA: Insufficient documentation

## 2012-10-12 DIAGNOSIS — Z8679 Personal history of other diseases of the circulatory system: Secondary | ICD-10-CM | POA: Insufficient documentation

## 2012-10-12 DIAGNOSIS — R011 Cardiac murmur, unspecified: Secondary | ICD-10-CM | POA: Insufficient documentation

## 2012-10-12 DIAGNOSIS — Z79899 Other long term (current) drug therapy: Secondary | ICD-10-CM | POA: Insufficient documentation

## 2012-10-12 DIAGNOSIS — Z951 Presence of aortocoronary bypass graft: Secondary | ICD-10-CM | POA: Insufficient documentation

## 2012-10-12 DIAGNOSIS — Z8669 Personal history of other diseases of the nervous system and sense organs: Secondary | ICD-10-CM | POA: Insufficient documentation

## 2012-10-12 DIAGNOSIS — G40909 Epilepsy, unspecified, not intractable, without status epilepticus: Secondary | ICD-10-CM | POA: Insufficient documentation

## 2012-10-12 DIAGNOSIS — I10 Essential (primary) hypertension: Secondary | ICD-10-CM | POA: Insufficient documentation

## 2012-10-12 DIAGNOSIS — Z8673 Personal history of transient ischemic attack (TIA), and cerebral infarction without residual deficits: Secondary | ICD-10-CM | POA: Insufficient documentation

## 2012-10-12 DIAGNOSIS — Z8719 Personal history of other diseases of the digestive system: Secondary | ICD-10-CM | POA: Insufficient documentation

## 2012-10-12 MED ORDER — SODIUM CHLORIDE 0.9 % IV SOLN
500.0000 mg | Freq: Once | INTRAVENOUS | Status: AC
  Administered 2012-10-12: 500 mg via INTRAVENOUS
  Filled 2012-10-12: qty 5

## 2012-10-12 MED ORDER — LEVETIRACETAM 500 MG PO TABS: 750.0000 mg | ORAL_TABLET | Freq: Two times a day (BID) | ORAL | Status: DC

## 2012-10-12 NOTE — ED Notes (Signed)
Patient given copy of discharge paperwork; went over discharge instructions with patient and family member (grandson).  Patient instructed to increase Keppra dosage (as directed by Dr. Hyacinth Meeker), and to follow up with neurologist first thing Monday morning for recheck and possible change in medication or dosage.  Patient instructed to return to the ED for new, worsening, or concerning symptoms.

## 2012-10-12 NOTE — ED Provider Notes (Signed)
History     CSN: [id number]  Arrival date & time 10/12/12  1712   None     Chief Complaint  Patient presents with  . Seizures    (Consider location/radiation/quality/duration/timing/severity/associated sxs/prior treatment) HPI Comments: 67 year old male with a history of seizure disorder who presents with a complaint of seizure. The patient does not remember what happened, family members state that the patient had a seizure that was witnessed, when EMS arrived the patient refused IV, he has normal mental status at this time and has not had any urinary incontinence or tongue biting. According to the medical record the patient was admitted in the last month for a seizure at which time he stopped his Dilantin and has been started on Keppra. He states that his grandson is living with him and has been giving him his medications appropriately. At this time he denies any complaints including headache, blurred vision, weakness or numbness.  Patient is a 67 y.o. male presenting with seizures. The history is provided by the patient, the EMS personnel and medical records.  Seizures     Past Medical History  Diagnosis Date  . Hypertension   . BPH (benign prostatic hyperplasia)   . Syncope and collapse 08/07/2012    "w/loss of consciousness; found down; don't know how long he was out" (08/07/2012)  . Heart murmur     "don't exist now" (08/07/2012)  . H/O hiatal hernia   . Seizures     "h/o gran mal & petit" (08/07/2012)  . Stroke 1980's    "mini w/seizure at the same time" (08/07/2012)  . Coronary artery disease   . LBBB (left bundle branch block)     Past Surgical History  Procedure Date  . Coronary artery bypass graft ~ 2010    CABG X1  . Tibia fracture surgery 1960's    RLE    History reviewed. No pertinent family history.  History  Substance Use Topics  . Smoking status: Never Smoker   . Smokeless tobacco: Never Used  . Alcohol Use: No      Review of Systems  Neurological:  Positive for seizures.  All other systems reviewed and are negative.    Allergies  Codeine  Home Medications   Current Outpatient Rx  Name  Route  Sig  Dispense  Refill  . ACETAMINOPHEN 325 MG PO TABS   Oral   Take 650 mg by mouth every 6 (six) hours as needed. For pain         . GAVISCON PO   Oral   Take 1 tablet by mouth every 6 (six) hours as needed.         . ASPIRIN 81 MG PO CHEW   Oral   Chew 81 mg by mouth daily.         Marland Kitchen CARBOXYMETHYLCELLULOSE SODIUM 1 % OP SOLN   Both Eyes   Place 1 drop into both eyes 5 (five) times daily.         . DORZOLAMIDE HCL-TIMOLOL MAL 22.3-6.8 MG/ML OP SOLN   Both Eyes   Place 1 drop into both eyes 2 (two) times daily.         Marland Kitchen FINASTERIDE 5 MG PO TABS   Oral   Take 5 mg by mouth daily.         Marland Kitchen HYDROCHLOROTHIAZIDE 25 MG PO TABS   Oral   Take 25 mg by mouth daily.         Marland Kitchen HYDROCODONE-ACETAMINOPHEN 5-325 MG PO  TABS   Oral   Take 1 tablet by mouth every 4 (four) hours as needed (due to rib fractures).   60 tablet   0   . LATANOPROST 0.005 % OP SOLN   Both Eyes   Place 1 drop into both eyes at bedtime.         Marland Kitchen LEVETIRACETAM 500 MG PO TABS   Oral   Take 1.5 tablets (750 mg total) by mouth 2 (two) times daily.   60 tablet   0   . METOPROLOL TARTRATE 25 MG PO TABS   Oral   Take 25 mg by mouth 2 (two) times daily.         . ADULT MULTIVITAMIN W/MINERALS CH   Oral   Take 1 tablet by mouth daily.         Marland Kitchen NIFEDIPINE ER 90 MG PO TB24   Oral   Take 90 mg by mouth daily.         Marland Kitchen OMEPRAZOLE 20 MG PO CPDR   Oral   Take 20 mg by mouth daily.         Marland Kitchen POLYETHYLENE GLYCOL 3350 PO PACK   Oral   Take 17 g by mouth daily as needed.   14 each   0   . POTASSIUM CHLORIDE CRYS ER 20 MEQ PO TBCR   Oral   Take 20 mEq by mouth 2 (two) times daily.         Marland Kitchen TERAZOSIN HCL 10 MG PO CAPS   Oral   Take 10 mg by mouth at bedtime.           BP 135/70  Pulse 77  Temp 98.1 F (36.7 C)  (Oral)  Resp 18  SpO2 100%  Physical Exam  Nursing note and vitals reviewed. Constitutional: He appears well-developed and well-nourished. No distress.  HENT:  Head: Normocephalic and atraumatic.  Mouth/Throat: Oropharynx is clear and moist. No oropharyngeal exudate.  Eyes: Conjunctivae normal and EOM are normal. Pupils are equal, round, and reactive to light. Right eye exhibits no discharge. Left eye exhibits no discharge. No scleral icterus.  Neck: Normal range of motion. Neck supple. No JVD present. No thyromegaly present.  Cardiovascular: Normal rate, regular rhythm, normal heart sounds and intact distal pulses.  Exam reveals no gallop and no friction rub.   No murmur heard. Pulmonary/Chest: Effort normal and breath sounds normal. No respiratory distress. He has no wheezes. He has no rales.  Abdominal: Soft. Bowel sounds are normal. He exhibits no distension and no mass. There is no tenderness.  Musculoskeletal: Normal range of motion. He exhibits no edema and no tenderness.  Lymphadenopathy:    He has no cervical adenopathy.  Neurological: He is alert. Coordination normal.       Speech is clear, goal directed, patient is alert and oriented to his location and his name, moves all extremities without difficulty to command  Skin: Skin is warm and dry. No rash noted. No erythema.  Psychiatric: He has a normal mood and affect. His behavior is normal.    ED Course  Procedures (including critical care time)  Labs Reviewed - No data to display No results found.   1. Seizure       MDM  There is no evidence of trauma related to the seizure, there is no family members here to answer any questions, the patient appears hemodynamically stable, he will be given 500 mg of Keppra IV while we waited family members.  Patient  has been awake and alert his entire stay. His family member has finally showed up and states that he had a seizure where he was not responsive, he was breathing without  any difficulty and seemed to have shaking of his arm unilaterally. This lasted less than 10 minutes and he resolved spontaneously. I have increased his Keppra dose to 750 mg by mouth twice a day, he has been given IV Keppra in the emergency department and appears stable for discharge. He'll be discharged in the care of a family member.      Vida Roller, MD 10/12/12 [phone]

## 2012-10-12 NOTE — ED Notes (Signed)
Received bedside report from [employer], Charity fundraiser.  Patient currently resting quietly in bed; no respiratory or acute distress noted.  Patient updated on plan of care; informed patient that we are currently waiting on further orders/disposition from EDP.  Patient denies any needs at this time; family present at bedside.  Will continue to monitor.

## 2012-10-12 NOTE — ED Notes (Signed)
Pt presents to ED via EMS. Family called EMS for seizure witnessed by family. Pt refused IV.

## 2012-10-12 NOTE — ED Notes (Signed)
Patient states that he is ready to go home; Dr. Hyacinth Meeker notified.

## 2013-02-28 ENCOUNTER — Emergency Department (HOSPITAL_COMMUNITY): Payer: Medicare Other

## 2013-02-28 ENCOUNTER — Encounter (HOSPITAL_COMMUNITY): Payer: Self-pay | Admitting: *Deleted

## 2013-02-28 ENCOUNTER — Inpatient Hospital Stay (HOSPITAL_COMMUNITY)
Admission: EM | Admit: 2013-02-28 | Discharge: 2013-03-04 | DRG: 312 | Disposition: A | Discharge status: Home / Self Care | Payer: Medicare Other | Attending: Internal Medicine | Admitting: Internal Medicine

## 2013-02-28 DIAGNOSIS — N400 Enlarged prostate without lower urinary tract symptoms: Secondary | ICD-10-CM | POA: Diagnosis present

## 2013-02-28 DIAGNOSIS — E860 Dehydration: Secondary | ICD-10-CM | POA: Diagnosis present

## 2013-02-28 DIAGNOSIS — G40909 Epilepsy, unspecified, not intractable, without status epilepticus: Secondary | ICD-10-CM | POA: Diagnosis present

## 2013-02-28 DIAGNOSIS — Z8673 Personal history of transient ischemic attack (TIA), and cerebral infarction without residual deficits: Secondary | ICD-10-CM

## 2013-02-28 DIAGNOSIS — I440 Atrioventricular block, first degree: Secondary | ICD-10-CM | POA: Diagnosis present

## 2013-02-28 DIAGNOSIS — R569 Unspecified convulsions: Secondary | ICD-10-CM

## 2013-02-28 DIAGNOSIS — G9340 Encephalopathy, unspecified: Secondary | ICD-10-CM

## 2013-02-28 DIAGNOSIS — I10 Essential (primary) hypertension: Secondary | ICD-10-CM | POA: Diagnosis present

## 2013-02-28 DIAGNOSIS — R55 Syncope and collapse: Principal | ICD-10-CM | POA: Diagnosis present

## 2013-02-28 DIAGNOSIS — I69992 Facial weakness following unspecified cerebrovascular disease: Secondary | ICD-10-CM

## 2013-02-28 DIAGNOSIS — E876 Hypokalemia: Secondary | ICD-10-CM | POA: Diagnosis present

## 2013-02-28 DIAGNOSIS — I447 Left bundle-branch block, unspecified: Secondary | ICD-10-CM | POA: Diagnosis present

## 2013-02-28 DIAGNOSIS — Z951 Presence of aortocoronary bypass graft: Secondary | ICD-10-CM

## 2013-02-28 DIAGNOSIS — R001 Bradycardia, unspecified: Secondary | ICD-10-CM

## 2013-02-28 DIAGNOSIS — I2510 Atherosclerotic heart disease of native coronary artery without angina pectoris: Secondary | ICD-10-CM | POA: Diagnosis present

## 2013-02-28 DIAGNOSIS — I498 Other specified cardiac arrhythmias: Secondary | ICD-10-CM | POA: Diagnosis present

## 2013-02-28 DIAGNOSIS — Z9181 History of falling: Secondary | ICD-10-CM

## 2013-02-28 LAB — CBC
Platelets: 176 10*3/uL (ref 150–400)
RBC: 5.4 MIL/uL (ref 4.22–5.81)
RDW: 15.2 % (ref 11.5–15.5)
WBC: 8.2 10*3/uL (ref 4.0–10.5)

## 2013-02-28 LAB — URINALYSIS, DIPSTICK ONLY
Glucose, UA: NEGATIVE mg/dL
Specific Gravity, Urine: 1.009 (ref 1.005–1.030)
Urobilinogen, UA: 0.2 mg/dL (ref 0.0–1.0)
pH: 7 (ref 5.0–8.0)

## 2013-02-28 LAB — BASIC METABOLIC PANEL
Chloride: 100 mEq/L (ref 96–112)
Creatinine, Ser: 1.03 mg/dL (ref 0.50–1.35)
GFR calc Af Amer: 84 mL/min — ABNORMAL LOW (ref >90)
Potassium: 3.3 mEq/L — ABNORMAL LOW (ref 3.5–5.1)
Sodium: 138 mEq/L (ref 135–145)

## 2013-02-28 LAB — POCT I-STAT TROPONIN I

## 2013-02-28 MED ORDER — SODIUM CHLORIDE 0.9 % IV BOLUS (SEPSIS)
1000.0000 mL | Freq: Once | INTRAVENOUS | Status: AC
  Administered 2013-02-28: 1000 mL via INTRAVENOUS

## 2013-02-28 NOTE — ED Notes (Signed)
This RN attempted to call report x 1. Was told that the receiving nurse would call back.

## 2013-02-28 NOTE — ED Notes (Signed)
Per EMS pt from home with c/o near syncope. Pt and brother were sitting on front porch. Pt went to go inside and when he grabbed the door knob began to slump over. Brother caught pt and placed him in a chair. Reports he was unresponsive to brother for about 5 minutes, not responding to verbal stimuli. Hx of seizures, but usually gran mal. No neuro deficits. Initially pt confused per EMS, not giving correct answers. VSS. CBG 104. IV 20G L hand.

## 2013-02-28 NOTE — ED Provider Notes (Signed)
History    CSN: [id number] Arrival date & time 02/28/13  1627  First MD Initiated Contact with Patient 02/28/13 1631     Chief Complaint  Patient presents with  . Near Syncope   (Consider location/radiation/quality/duration/timing/severity/associated sxs/prior Treatment) Patient is a 67 y.o. male presenting with syncope.  Loss of Consciousness Episode history:  Single Most recent episode:  Today Duration:  5 minutes Timing: once. Progression:  Unchanged Chronicity:  Recurrent Context comment:  Standing and walking to door Witnessed: yes   Relieved by:  Nothing Worsened by:  Nothing tried Ineffective treatments:  None tried Associated symptoms: confusion and recent fall   Associated symptoms: no chest pain, no dizziness, no fever, no focal sensory loss, no headaches, no nausea, no shortness of breath, no visual change and no vomiting   Risk factors: coronary artery disease    Past Medical History  Diagnosis Date  . Hypertension   . BPH (benign prostatic hyperplasia)   . Syncope and collapse 08/07/2012    "w/loss of consciousness; found down; don't know how long he was out" (08/07/2012)  . Heart murmur     "don't exist now" (08/07/2012)  . H/O hiatal hernia   . Seizures     "h/o gran mal & petit" (08/07/2012)  . Stroke 1980's    "mini w/seizure at the same time" (08/07/2012)  . Coronary artery disease   . LBBB (left bundle branch block)    Past Surgical History  Procedure Laterality Date  . Coronary artery bypass graft  ~ 2010    CABG X1  . Tibia fracture surgery  1960's    RLE   No family history on file. History  Substance Use Topics  . Smoking status: Never Smoker   . Smokeless tobacco: Never Used  . Alcohol Use: No    Review of Systems  Constitutional: Negative for fever and chills.  HENT: Negative for congestion, sore throat and rhinorrhea.   Eyes: Negative for photophobia and visual disturbance.  Respiratory: Negative for cough and shortness of breath.    Cardiovascular: Positive for syncope. Negative for chest pain and leg swelling.  Gastrointestinal: Negative for nausea, vomiting, abdominal pain, diarrhea and constipation.  Endocrine: Negative for polydipsia and polyuria.  Genitourinary: Negative for dysuria and hematuria.  Musculoskeletal: Negative for back pain and arthralgias.  Skin: Negative for color change and rash.  Neurological: Negative for dizziness, syncope, light-headedness and headaches.  Hematological: Negative for adenopathy. Does not bruise/bleed easily.  Psychiatric/Behavioral: Positive for confusion.  All other systems reviewed and are negative.    Allergies  Codeine  Home Medications   No current outpatient prescriptions on file. BP 169/96  Pulse 62  Temp(Src) 98.4 F (36.9 C) (Oral)  Resp 13  SpO2 99% Physical Exam  Vitals reviewed. Constitutional: He is oriented to person, place, and time. He appears well-developed and well-nourished.  HENT:  Head: Normocephalic and atraumatic.  Eyes: Conjunctivae and EOM are normal.  Neck: Normal range of motion. Neck supple.  Cardiovascular: Normal rate, regular rhythm and normal heart sounds.   Pulmonary/Chest: Effort normal and breath sounds normal. No respiratory distress.  Abdominal: He exhibits no distension. There is no tenderness. There is no rebound and no guarding.  Musculoskeletal: Normal range of motion.  Neurological: He is alert and oriented to person, place, and time.  Skin: Skin is warm and dry.    ED Course  Procedures (including critical care time) Labs Reviewed  CBC - Abnormal; Notable for the following:  MCV 75.0 (*)    All other components within normal limits  BASIC METABOLIC PANEL - Abnormal; Notable for the following:    Potassium 3.3 (*)    Glucose, Bld 102 (*)    GFR calc non Af Amer 72 (*)    GFR calc Af Amer 84 (*)    All other components within normal limits  URINALYSIS, DIPSTICK ONLY - Abnormal; Notable for the following:     Hgb urine dipstick TRACE (*)    Protein, ur 30 (*)    All other components within normal limits  POCT I-STAT TROPONIN I  POCT I-STAT TROPONIN I  POCT I-STAT TROPONIN I   Dg Chest 2 View  02/28/2013   *RADIOLOGY REPORT*  Clinical Data: Near-syncope, history hypertension, heart murmur, coronary artery disease, smoking, seizures  CHEST - 2 VIEW  Comparison: 09/19/2012  Findings: Enlargement of cardiac silhouette with pulmonary vascular congestion post CABG. Large hiatal hernia. Mediastinal contours otherwise normal. No acute failure or consolidation. Minimal left basilar atelectasis. No pleural effusion or pneumothorax. Bones demineralized.  IMPRESSION: Enlargement of cardiac silhouette with pulmonary vascular congestion post CABG. Large hiatal hernia with minimal left basilar atelectasis.   Original Report Authenticated By: Ulyses Southward, M.D.   Ct Head Wo Contrast  02/28/2013   *RADIOLOGY REPORT*  Clinical Data: The patient was unresponsive for 5 minutes.  History of strokes and seizures. Stroke risk factors include hypertension, and cardiac arrhythmia.  CT HEAD WITHOUT CONTRAST  Technique:  Contiguous axial images were obtained from the base of the skull through the vertex without contrast.  Comparison: Prior MR head 09/18/2012.  Prior CT head 09/17/2012.  Findings: No acute stroke or hemorrhage.  No mass lesion or hydrocephalus.  No extra-axial fluid.  Large remote right hemisphere infarct predominantly affects the temporal lobe with resultant asymmetric enlargement of the right lateral ventricle.  The cerebellum is markedly atrophic, to a greater degree than the cerebral hemispheres.  There is extensive chronic microvascular ischemic change along with gliosis and encephalomalacia related to remote infarct.  The calvarium is intact.  No worrisome osseous lesions.  No acute sinus or mastoid fluid.  Advanced vascular calcification.  Similar appearance to priors.  IMPRESSION: No acute intracranial findings.   Chronic changes as described.   Original Report Authenticated By: Davonna Belling, M.D.   1. Near syncope   2. Troponin I above reference range      Date: 02/28/2013  Rate: 60  Rhythm: normal sinus rhythm  QRS Axis: normal  Intervals: normal  ST/T Wave abnormalities: nonspecific ST changes  Conduction Disutrbances:left bundle branch block  Narrative Interpretation:   Old EKG Reviewed: unchanged    MDM  67 y.o. male  with pertinent PMH of CAD, prior TIA, seizures presents with near syncope today, witnessed by brother.  No true syncope, seizure like activity (past seizures generalized).  Pt has been falling and has no recollection of falls over last week, has been seen in the past with unremarkable wu for same.  No recent fevers, and pt denies cp, dyspnea, or focal neuro symptoms.  Physical exam today as above without focal deficits, pt without acute findings and now is back at baseline.  ECG as above.  CXR and head CT without acute findings.  Labs similarly without gross abnormality, however delta troponin elevated.  Consulted medicine, pt admitted for further wu and monitoring.      Labs and imaging as above reviewed by myself and attending,Dr. Ranae Palms, with whom case was discussed.  1. Near syncope   2. Troponin I above reference range         Noel Gerold, MD 03/01/13 0010

## 2013-03-01 ENCOUNTER — Encounter (HOSPITAL_COMMUNITY): Payer: Self-pay | Admitting: General Practice

## 2013-03-01 DIAGNOSIS — I2510 Atherosclerotic heart disease of native coronary artery without angina pectoris: Secondary | ICD-10-CM

## 2013-03-01 DIAGNOSIS — R55 Syncope and collapse: Principal | ICD-10-CM

## 2013-03-01 DIAGNOSIS — R001 Bradycardia, unspecified: Secondary | ICD-10-CM | POA: Diagnosis present

## 2013-03-01 DIAGNOSIS — I10 Essential (primary) hypertension: Secondary | ICD-10-CM

## 2013-03-01 DIAGNOSIS — G40909 Epilepsy, unspecified, not intractable, without status epilepticus: Secondary | ICD-10-CM

## 2013-03-01 DIAGNOSIS — I498 Other specified cardiac arrhythmias: Secondary | ICD-10-CM

## 2013-03-01 LAB — MAGNESIUM: Magnesium: 1.9 mg/dL (ref 1.5–2.5)

## 2013-03-01 LAB — CBC
Hemoglobin: 12.7 g/dL — ABNORMAL LOW (ref 13.0–17.0)
MCH: 26 pg (ref 26.0–34.0)
MCHC: 34.4 g/dL (ref 30.0–36.0)
Platelets: 150 10*3/uL (ref 150–400)
RDW: 15.2 % (ref 11.5–15.5)

## 2013-03-01 LAB — TROPONIN I: Troponin I: <0.3 ng/mL (ref <0.30)

## 2013-03-01 LAB — BASIC METABOLIC PANEL
Calcium: 9 mg/dL (ref 8.4–10.5)
GFR calc Af Amer: >90 mL/min (ref >90)
GFR calc non Af Amer: 86 mL/min — ABNORMAL LOW (ref >90)
Glucose, Bld: 119 mg/dL — ABNORMAL HIGH (ref 70–99)
Potassium: 3 mEq/L — ABNORMAL LOW (ref 3.5–5.1)
Sodium: 139 mEq/L (ref 135–145)

## 2013-03-01 LAB — POCT I-STAT TROPONIN I: Troponin i, poc: 0.05 ng/mL (ref 0.00–0.08)

## 2013-03-01 MED ORDER — TERAZOSIN HCL 5 MG PO CAPS
10.0000 mg | ORAL_CAPSULE | Freq: Every day | ORAL | Status: DC
  Administered 2013-03-01 (×2): 10 mg via ORAL
  Filled 2013-03-01 (×3): qty 2

## 2013-03-01 MED ORDER — HEPARIN SODIUM (PORCINE) 5000 UNIT/ML IJ SOLN
5000.0000 [IU] | Freq: Three times a day (TID) | INTRAMUSCULAR | Status: DC
  Administered 2013-03-01 – 2013-03-04 (×9): 5000 [IU] via SUBCUTANEOUS
  Filled 2013-03-01 (×13): qty 1

## 2013-03-01 MED ORDER — POTASSIUM CHLORIDE CRYS ER 20 MEQ PO TBCR
40.0000 meq | EXTENDED_RELEASE_TABLET | Freq: Three times a day (TID) | ORAL | Status: DC
  Administered 2013-03-01 – 2013-03-04 (×10): 40 meq via ORAL
  Filled 2013-03-01 (×13): qty 2

## 2013-03-01 MED ORDER — SODIUM CHLORIDE 0.9 % IJ SOLN
3.0000 mL | Freq: Two times a day (BID) | INTRAMUSCULAR | Status: DC
  Administered 2013-03-01 – 2013-03-03 (×4): 3 mL via INTRAVENOUS

## 2013-03-01 MED ORDER — POTASSIUM CHLORIDE CRYS ER 20 MEQ PO TBCR
20.0000 meq | EXTENDED_RELEASE_TABLET | Freq: Two times a day (BID) | ORAL | Status: DC
  Administered 2013-03-01: 20 meq via ORAL
  Filled 2013-03-01 (×3): qty 1

## 2013-03-01 MED ORDER — POLYVINYL ALCOHOL 1.4 % OP SOLN
1.0000 [drp] | Freq: Every day | OPHTHALMIC | Status: DC
  Administered 2013-03-01 – 2013-03-04 (×16): 1 [drp] via OPHTHALMIC
  Filled 2013-03-01: qty 15

## 2013-03-01 MED ORDER — POTASSIUM CHLORIDE IN NACL 40-0.9 MEQ/L-% IV SOLN
INTRAVENOUS | Status: DC
  Administered 2013-03-01 – 2013-03-02 (×2): via INTRAVENOUS
  Filled 2013-03-01 (×6): qty 1000

## 2013-03-01 MED ORDER — PANTOPRAZOLE SODIUM 40 MG PO TBEC
80.0000 mg | DELAYED_RELEASE_TABLET | Freq: Every day | ORAL | Status: DC
  Administered 2013-03-01 – 2013-03-04 (×4): 80 mg via ORAL
  Filled 2013-03-01 (×4): qty 2

## 2013-03-01 MED ORDER — HYDROCHLOROTHIAZIDE 25 MG PO TABS
25.0000 mg | ORAL_TABLET | Freq: Every day | ORAL | Status: DC
  Filled 2013-03-01: qty 1

## 2013-03-01 MED ORDER — CARBOXYMETHYLCELLULOSE SODIUM 1 % OP SOLN: 1.0000 [drp] | Freq: Every day | OPHTHALMIC | Status: DC

## 2013-03-01 MED ORDER — ACETAMINOPHEN 325 MG PO TABS
650.0000 mg | ORAL_TABLET | Freq: Four times a day (QID) | ORAL | Status: DC | PRN
  Administered 2013-03-03: 650 mg via ORAL
  Filled 2013-03-01: qty 2

## 2013-03-01 MED ORDER — FINASTERIDE 5 MG PO TABS
5.0000 mg | ORAL_TABLET | Freq: Every day | ORAL | Status: DC
  Administered 2013-03-01 – 2013-03-04 (×4): 5 mg via ORAL
  Filled 2013-03-01 (×4): qty 1

## 2013-03-01 MED ORDER — DORZOLAMIDE HCL-TIMOLOL MAL 2-0.5 % OP SOLN
1.0000 [drp] | Freq: Two times a day (BID) | OPHTHALMIC | Status: DC
  Administered 2013-03-01 – 2013-03-04 (×8): 1 [drp] via OPHTHALMIC
  Filled 2013-03-01: qty 10

## 2013-03-01 MED ORDER — ASPIRIN 81 MG PO CHEW
81.0000 mg | CHEWABLE_TABLET | Freq: Every day | ORAL | Status: DC
  Administered 2013-03-01 – 2013-03-04 (×4): 81 mg via ORAL
  Filled 2013-03-01 (×4): qty 1

## 2013-03-01 MED ORDER — LATANOPROST 0.005 % OP SOLN
1.0000 [drp] | Freq: Every day | OPHTHALMIC | Status: DC
  Administered 2013-03-01 – 2013-03-03 (×4): 1 [drp] via OPHTHALMIC
  Filled 2013-03-01: qty 2.5

## 2013-03-01 MED ORDER — ALBUTEROL SULFATE HFA 108 (90 BASE) MCG/ACT IN AERS
2.0000 | INHALATION_SPRAY | Freq: Four times a day (QID) | RESPIRATORY_TRACT | Status: DC | PRN
  Filled 2013-03-01: qty 6.7

## 2013-03-01 MED ORDER — LEVETIRACETAM 750 MG PO TABS
750.0000 mg | ORAL_TABLET | Freq: Two times a day (BID) | ORAL | Status: DC
  Administered 2013-03-01 – 2013-03-04 (×8): 750 mg via ORAL
  Filled 2013-03-01 (×9): qty 1

## 2013-03-01 NOTE — Progress Notes (Signed)
MD made aware of pt's HR being as low as 28 as well pt having Pauses longest being 2.6 seconds. Pt also having diarrhea. New orders given to place pt on contact/enteric & a stool sample to  r/o c-diff. No new orders given for heart rate. Parameters changed to 35. Pt asymptomatic & sleeping. Will continue to monitor the pt. Sanda Linger

## 2013-03-01 NOTE — Progress Notes (Signed)
UR Chart Review Completed  

## 2013-03-01 NOTE — Progress Notes (Signed)
Patient seen and examined Will obtain records from the [place] [facility] likely does have a cardiologist by the brother and the patient are unsure of this information He was noted to have bradycardia with 2.5 second pauses on telemetry overnight Blood pressure has been stable despite bradycardia Cardiology will be consulted, paged [facility]    MRI brain -garbled speech with a facial droop

## 2013-03-01 NOTE — Progress Notes (Signed)
Pt arrived to floor. Pt's hr noted to be in the 40s. MD made aware of pt's arrival & HR. Suction & oxygen set up at bedside. Will continue to monitor the pt. Sanda Linger

## 2013-03-01 NOTE — Consult Note (Signed)
ELECTROPHYSIOLOGY CONSULT NOTE    Patient ID: ZAKI GERTSCH MRN: [id number], DOB/AGE: 12/11/1945 67 y.o.  Admit date: 02/28/2013 Date of Consult: 03-01-2013  Primary Physician: Jeanice Lim, [place]  Reason for Consultation: bradycardia and syncope  HPI:  Mr. Brearley is a 67 year old male with a past medical history of hypertension, BPH, coronary artery disease s/p CABG, and seizure disorder (on Keppra).  He has had several episodes recently of altered mental status and has been evaluated at [facility] (last in January of this year).  He also has had bradycardia noted recently and his physicians at the [place] discontinued his Metoprolol.  It is unclear whether or not he had symptoms related to his bradycardia.  This was originally noted by his home health RN.  His brother states that yesterday, he received a call from the pastor because Mr Kolodziejski did not recognize him.  When he arrived at the home, the patient came to the door and was able to be reoriented.  He was still a little confused.  According to his brother, there is no air conditioning in the patient's home.  He then came outside and was brushing off the banisters, he turned to go inside and slumped against the door. His brother caught him and sat him in the chair.  The patient was lethargic and slow to respond and so they called 911.  On EMS arrival, he was still lethargic and not responding appropriately.  Initial BP on arrival was 176/80 with pulse of 63.  EKG shows sinus rhythm with first degree AV block, LBBB, and PVC's.  He was transported to [facility] for further evaluation.  On telemetry, he has been noted to have heart rates into the 30's while sleeping.  When the patient is awake, his heart rates are in the 60's.  He has had no symptoms similar to his episode yesterday since being in the hospital.   Lab work has been remarkable for hypokalemia.  TSH pending. Normal troponin.   Last echo 08/2012 demonstrated EF of 45-50% with no RWMA, grade 2  diastolic dysfunction, calcified mitral valve annulus, LA mildly dilated.  The patient denies recent fevers, chills, chest pain, shortness of breath, weight loss or weight gain.  EP has been asked to evaluate for treatment options.  ROS is negative except as outlined above.   Past Medical History  Diagnosis Date  . Hypertension   . BPH (benign prostatic hyperplasia)   . Syncope and collapse 08/07/2012    "w/loss of consciousness; found down; don't know how long he was out" (08/07/2012)  . H/O hiatal hernia   . Seizures     "h/o gran mal & petit" (08/07/2012)  . Stroke 1980's    "mini w/seizure at the same time" (08/07/2012)  . Coronary artery disease   . LBBB (left bundle branch block)      Surgical History:  Past Surgical History  Procedure Laterality Date  . Coronary artery bypass graft  ~ 2010    CABG X1  . Tibia fracture surgery  1960's    RLE     Prescriptions prior to admission  Medication Sig Dispense Refill  . acetaminophen (TYLENOL) 325 MG tablet Take 650 mg by mouth every 6 (six) hours as needed. For pain      . albuterol (PROVENTIL HFA;VENTOLIN HFA) 108 (90 BASE) MCG/ACT inhaler Inhale 2 puffs into the lungs every 6 (six) hours as needed for wheezing.      Marland Kitchen aspirin 81 MG chewable tablet  Chew 81 mg by mouth daily.      . carboxymethylcellulose 1 % ophthalmic solution Place 1 drop into both eyes 5 (five) times daily.      . dorzolamide-timolol (COSOPT) 22.3-6.8 MG/ML ophthalmic solution Place 1 drop into both eyes 2 (two) times daily.      . finasteride (PROSCAR) 5 MG tablet Take 5 mg by mouth daily.      . hydrochlorothiazide (HYDRODIURIL) 25 MG tablet Take 25 mg by mouth daily.      Marland Kitchen latanoprost (XALATAN) 0.005 % ophthalmic solution Place 1 drop into both eyes at bedtime.      . levETIRAcetam (KEPPRA) 500 MG tablet Take 1.5 tablets (750 mg total) by mouth 2 (two) times daily.  60 tablet  0  . Multiple Vitamin (MULTIVITAMIN WITH MINERALS) TABS Take 1 tablet by mouth  daily.      Marland Kitchen NIFEdipine (ADALAT CC) 90 MG 24 hr tablet Take 90 mg by mouth daily.      Marland Kitchen omeprazole (PRILOSEC) 20 MG capsule Take 20 mg by mouth daily.      . potassium chloride SA (K-DUR,KLOR-CON) 20 MEQ tablet Take 20 mEq by mouth 2 (two) times daily.      Marland Kitchen terazosin (HYTRIN) 10 MG capsule Take 10 mg by mouth at bedtime.        Inpatient Medications:  . aspirin  81 mg Oral Daily  . dorzolamide-timolol  1 drop Both Eyes BID  . finasteride  5 mg Oral Daily  . heparin  5,000 Units Subcutaneous Q8H  . latanoprost  1 drop Both Eyes QHS  . levETIRAcetam  750 mg Oral BID  . pantoprazole  80 mg Oral Q breakfast  . polyvinyl alcohol  1 drop Both Eyes 5 X Daily  . potassium chloride SA  40 mEq Oral TID  . sodium chloride  3 mL Intravenous Q12H  . terazosin  10 mg Oral QHS    Allergies:  Allergies  Allergen Reactions  . Codeine Rash    History   Social History  . Marital Status: Legally Separated    Spouse Name: N/A    Number of Children: N/A  . Years of Education: N/A   Occupational History  . Not on file.   Social History Main Topics  . Smoking status: Never Smoker   . Smokeless tobacco: Never Used  . Alcohol Use: No  . Drug Use: No  . Sexually Active:    Other Topics Concern  . Not on file   Social History Narrative  . No narrative on file     Family History  Problem Relation Age of Onset  . Heart disease      No family history      Labs:   Lab Results  Component Value Date   WBC 6.6 03/01/2013   HGB 12.7* 03/01/2013   HCT 36.9* 03/01/2013   MCV 75.6* 03/01/2013   PLT 150 03/01/2013    Recent Labs Lab 03/01/13 0242  NA 139  K 3.0*  CL 103  CO2 27  BUN 15  CREATININE 0.88  CALCIUM 9.0  GLUCOSE 119*   Lab Results  Component Value Date   TROPONINI <0.30 03/01/2013     Radiology/Studies: Dg Chest 2 View 02/28/2013   *RADIOLOGY REPORT*  Clinical Data: Near-syncope, history hypertension, heart murmur, coronary artery disease, smoking, seizures   CHEST - 2 VIEW  Comparison: 09/19/2012  Findings: Enlargement of cardiac silhouette with pulmonary vascular congestion post CABG. Large hiatal hernia. Mediastinal  contours otherwise normal. No acute failure or consolidation. Minimal left basilar atelectasis. No pleural effusion or pneumothorax. Bones demineralized.  IMPRESSION: Enlargement of cardiac silhouette with pulmonary vascular congestion post CABG. Large hiatal hernia with minimal left basilar atelectasis.   Original Report Authenticated By: Ulyses Southward, M.D.   Ct Head Wo Contrast 02/28/2013   *RADIOLOGY REPORT*  Clinical Data: The patient was unresponsive for 5 minutes.  History of strokes and seizures. Stroke risk factors include hypertension, and cardiac arrhythmia.  CT HEAD WITHOUT CONTRAST  Technique:  Contiguous axial images were obtained from the base of the skull through the vertex without contrast.  Comparison: Prior MR head 09/18/2012.  Prior CT head 09/17/2012.  Findings: No acute stroke or hemorrhage.  No mass lesion or hydrocephalus.  No extra-axial fluid.  Large remote right hemisphere infarct predominantly affects the temporal lobe with resultant asymmetric enlargement of the right lateral ventricle.  The cerebellum is markedly atrophic, to a greater degree than the cerebral hemispheres.  There is extensive chronic microvascular ischemic change along with gliosis and encephalomalacia related to remote infarct.  The calvarium is intact.  No worrisome osseous lesions.  No acute sinus or mastoid fluid.  Advanced vascular calcification.  Similar appearance to priors.  IMPRESSION: No acute intracranial findings.  Chronic changes as described.   Original Report Authenticated By: Davonna Belling, M.D.   NWG:NFAOZ rhythm with LBBB  TELEMETRY: sinus rhythm with periods of sinus bradycardia while sleeping Physical Exam: Filed Vitals:   02/28/13 2333 03/01/13 0005 03/01/13 0500 03/01/13 1355  BP: 169/96 154/71 165/64 137/89  Pulse: 62 70 43 62    Temp:  97.6 F (36.4 C) 98.9 F (37.2 C) 97.7 F (36.5 C)  TempSrc:  Oral Oral Oral  Resp: 13 16 16 18   Height:  5\' 6"  (1.676 m)    Weight:  154 lb 1.6 oz (69.899 kg)    SpO2: 99% 98% 96% 98%    GEN- The patient is elderly appearing, alert and oriented x 3 today.   Head- normocephalic, atraumatic Eyes-  Sclera clear, conjunctiva pink Ears- hearing intact Oropharynx- clear Neck- supple,   Lungs- Clear to ausculation bilaterally, normal work of breathing Heart- Regular rate and rhythm  GI- soft, NT, ND, + BS Extremities- no clubbing, cyanosis, or edema MS- + age appropriate muscle atrophy Skin- no rash or lesion Psych- euthymic mood, full affect Neuro- strength and sensation are intact  Assessment and Plan:  1. Syncope The patient had a postural syncopal event.  Shortly after the event (while still symptomatic), strips from EMS reveal sinus rhythm at 67 bpm.  I do not feel that bradycardia is responsible for this event.  Per his brother, the patient did not have his AC on yesterday (91 degrees outside) and the house was very warm.  I suspect that dehydration contributed to the event.  HCTZ is likely contributing to dehydration in this patient.  I would therefore stop hctz and also avoid hytrin if able.  2. Asymptomatic bradycardia/ first degree AV block/ LBBB His bradycardia has been asymptomatic and mostly while sleeping (he reports being asleep this am until 9:30am).  We will ambulate in the halls to evaluate heart rate response/ AV conduction.  Though he does not meet criteria for pacemaker at this time, he may eventually require PPM if symptoms develop.  3. HTN Mildly elevated I would try to keep SBP <150, DBP<90  No further EP workup planned at this point. Call with questions.

## 2013-03-01 NOTE — Consult Note (Signed)
HPI: 67 year old male for evaluation of syncope. The patient has a history of coronary artery disease and underwent coronary artery bypass and graft at the [facility]. He is unclear about the date. His most recent echocardiogram was performed in December of 2013. At that time his ejection fraction was 45-50%. There is grade 2 diastolic dysfunction. The patient also has a history of seizure disorder. He lives alone. He typically does not have dyspnea on exertion, orthopnea or exertional chest pain. He has chronic mild pedal edema. Yesterday the patient states he was sitting on his porch with his brother. He stood and began walking towards his house. He denies palpitations, chest pain, nausea or dyspnea. He apparently had a frank syncopal episode striking his head and atypical area. He has no recall of the events and does not know how long he was down. Note he typically has an aura prior to his seizures. He also has tonic-clonic activity. He had none of this yesterday. He has been admitted and cardiology is asked to evaluate. His heart rate has been noted to be in the high 20s at times with approximate 3 second pause. Also notes it sounds as though the patient was previously on metoprolol. Medication was discontinued 2 weeks ago by his report because of bradycardia.  Medications Prior to Admission  Medication Sig Dispense Refill  . acetaminophen (TYLENOL) 325 MG tablet Take 650 mg by mouth every 6 (six) hours as needed. For pain      . albuterol (PROVENTIL HFA;VENTOLIN HFA) 108 (90 BASE) MCG/ACT inhaler Inhale 2 puffs into the lungs every 6 (six) hours as needed for wheezing.      Marland Kitchen aspirin 81 MG chewable tablet Chew 81 mg by mouth daily.      . carboxymethylcellulose 1 % ophthalmic solution Place 1 drop into both eyes 5 (five) times daily.      . dorzolamide-timolol (COSOPT) 22.3-6.8 MG/ML ophthalmic solution Place 1 drop into both eyes 2 (two) times daily.      . finasteride (PROSCAR) 5 MG tablet Take 5  mg by mouth daily.      . hydrochlorothiazide (HYDRODIURIL) 25 MG tablet Take 25 mg by mouth daily.      Marland Kitchen latanoprost (XALATAN) 0.005 % ophthalmic solution Place 1 drop into both eyes at bedtime.      . levETIRAcetam (KEPPRA) 500 MG tablet Take 1.5 tablets (750 mg total) by mouth 2 (two) times daily.  60 tablet  0  . Multiple Vitamin (MULTIVITAMIN WITH MINERALS) TABS Take 1 tablet by mouth daily.      Marland Kitchen NIFEdipine (ADALAT CC) 90 MG 24 hr tablet Take 90 mg by mouth daily.      Marland Kitchen omeprazole (PRILOSEC) 20 MG capsule Take 20 mg by mouth daily.      . potassium chloride SA (K-DUR,KLOR-CON) 20 MEQ tablet Take 20 mEq by mouth 2 (two) times daily.      Marland Kitchen terazosin (HYTRIN) 10 MG capsule Take 10 mg by mouth at bedtime.        Allergies  Allergen Reactions  . Codeine Rash    Past Medical History  Diagnosis Date  . Hypertension   . BPH (benign prostatic hyperplasia)   . Syncope and collapse 08/07/2012    "w/loss of consciousness; found down; don't know how long he was out" (08/07/2012)  . H/O hiatal hernia   . Seizures     "h/o gran mal & petit" (08/07/2012)  . Stroke 1980's    "mini w/seizure at the  same time" (08/07/2012)  . Coronary artery disease   . LBBB (left bundle branch block)     Past Surgical History  Procedure Laterality Date  . Coronary artery bypass graft  ~ 2010    CABG X1  . Tibia fracture surgery  1960's    RLE    History   Social History  . Marital Status: Legally Separated    Spouse Name: N/A    Number of Children: N/A  . Years of Education: N/A   Occupational History  . Not on file.   Social History Main Topics  . Smoking status: Never Smoker   . Smokeless tobacco: Never Used  . Alcohol Use: No  . Drug Use: No  . Sexually Active:    Other Topics Concern  . Not on file   Social History Narrative  . No narrative on file    Family History  Problem Relation Age of Onset  . Heart disease      No family history    ROS:  no fevers or chills,  productive cough, hemoptysis, dysphasia, odynophagia, melena, hematochezia, dysuria, hematuria, rash, seizure activity, orthopnea, PND, claudication. Remaining systems are negative.  Physical Exam:   Blood pressure 165/64, pulse 43, temperature 98.9 F (37.2 C), temperature source Oral, resp. rate 16, height 5\' 6"  (1.676 m), weight 154 lb 1.6 oz (69.899 kg), SpO2 96.00%.  General:  Well developed/well nourished in NAD Skin warm/dry Patient not depressed No peripheral clubbing Back-normal HEENT-normal/normal eyelids Neck supple/normal carotid upstroke bilaterally; no bruits; no JVD; no thyromegaly chest - CTA/ normal expansion CV - RRR/normal S1 and S2; no rubs or gallops;  PMI nondisplaced; 2/6 systolic ejection murmur. Abdomen -NT/ND, no HSM, no mass, + bowel sounds, no bruit 2+ femoral pulse on the left and 1+ on the right, no bruits Ext-previous trauma to the right lower extremity. 1+ edema. Trace to 1+ edema on the left. Diminished distal pulses. Neuro-grossly nonfocal  ECG marked sinus bradycardia with a heart rate of 35, first degree AV block, left bundle branch block.  Results for orders placed during the hospital encounter of 02/28/13 (from the past 48 hour(s))  CBC     Status: Abnormal   Collection Time    02/28/13  6:26 PM      Result Value Range   WBC 8.2  4.0 - 10.5 K/uL   RBC 5.40  4.22 - 5.81 MIL/uL   Hemoglobin 14.4  13.0 - 17.0 g/dL   HCT 59.2  76.3 - 94.3 %   MCV 75.0 (*) 78.0 - 100.0 fL   MCH 26.7  26.0 - 34.0 pg   MCHC 35.6  30.0 - 36.0 g/dL   RDW 20.0  37.9 - 44.4 %   Platelets 176  150 - 400 K/uL  BASIC METABOLIC PANEL     Status: Abnormal   Collection Time    02/28/13  6:26 PM      Result Value Range   Sodium 138  135 - 145 mEq/L   Potassium 3.3 (*) 3.5 - 5.1 mEq/L   Chloride 100  96 - 112 mEq/L   CO2 26  19 - 32 mEq/L   Glucose, Bld 102 (*) 70 - 99 mg/dL   BUN 15  6 - 23 mg/dL   Creatinine, Ser 6.19  0.50 - 1.35 mg/dL   Calcium 9.4  8.4 - 01.2  mg/dL   GFR calc non Af Amer 72 (*) >90 mL/min   GFR calc Af Amer 84 (*) >90  mL/min   Comment:            The eGFR has been calculated     using the CKD EPI equation.     This calculation has not been     validated in all clinical     situations.     eGFR's persistently     <90 mL/min signify     possible Chronic Kidney Disease.  POCT I-STAT TROPONIN I     Status: None   Collection Time    02/28/13  6:37 PM      Result Value Range   Troponin i, poc 0.03  0.00 - 0.08 ng/mL   Comment 3            Comment: Due to the release kinetics of cTnI,     a negative result within the first hours     of the onset of symptoms does not rule out     myocardial infarction with certainty.     If myocardial infarction is still suspected,     repeat the test at appropriate intervals.  URINALYSIS, DIPSTICK ONLY     Status: Abnormal   Collection Time    02/28/13  6:45 PM      Result Value Range   Specific Gravity, Urine 1.009  1.005 - 1.030   pH 7.0  5.0 - 8.0   Glucose, UA NEGATIVE  NEGATIVE mg/dL   Hgb urine dipstick TRACE (*) NEGATIVE   Bilirubin Urine NEGATIVE  NEGATIVE   Ketones, ur NEGATIVE  NEGATIVE mg/dL   Protein, ur 30 (*) NEGATIVE mg/dL   Urobilinogen, UA 0.2  0.0 - 1.0 mg/dL   Nitrite NEGATIVE  NEGATIVE   Leukocytes, UA NEGATIVE  NEGATIVE  POCT I-STAT TROPONIN I     Status: None   Collection Time    02/28/13  8:55 PM      Result Value Range   Troponin i, poc 0.08  0.00 - 0.08 ng/mL   Comment NOTIFIED PHYSICIAN     Comment 3            Comment: Due to the release kinetics of cTnI,     a negative result within the first hours     of the onset of symptoms does not rule out     myocardial infarction with certainty.     If myocardial infarction is still suspected,     repeat the test at appropriate intervals.  POCT I-STAT TROPONIN I     Status: None   Collection Time    02/28/13 11:50 PM      Result Value Range   Troponin i, poc 0.05  0.00 - 0.08 ng/mL   Comment 3             Comment: Due to the release kinetics of cTnI,     a negative result within the first hours     of the onset of symptoms does not rule out     myocardial infarction with certainty.     If myocardial infarction is still suspected,     repeat the test at appropriate intervals.  CBC     Status: Abnormal   Collection Time    03/01/13  2:42 AM      Result Value Range   WBC 6.6  4.0 - 10.5 K/uL   RBC 4.88  4.22 - 5.81 MIL/uL   Hemoglobin 12.7 (*) 13.0 - 17.0 g/dL   HCT 45.4 (*) 09.8 - 11.9 %  MCV 75.6 (*) 78.0 - 100.0 fL   MCH 26.0  26.0 - 34.0 pg   MCHC 34.4  30.0 - 36.0 g/dL   RDW 53.6  64.4 - 03.4 %   Platelets 150  150 - 400 K/uL  BASIC METABOLIC PANEL     Status: Abnormal   Collection Time    03/01/13  2:42 AM      Result Value Range   Sodium 139  135 - 145 mEq/L   Potassium 3.0 (*) 3.5 - 5.1 mEq/L   Chloride 103  96 - 112 mEq/L   CO2 27  19 - 32 mEq/L   Glucose, Bld 119 (*) 70 - 99 mg/dL   BUN 15  6 - 23 mg/dL   Creatinine, Ser 7.42  0.50 - 1.35 mg/dL   Calcium 9.0  8.4 - 59.5 mg/dL   GFR calc non Af Amer 86 (*) >90 mL/min   GFR calc Af Amer >90  >90 mL/min   Comment:            The eGFR has been calculated     using the CKD EPI equation.     This calculation has not been     validated in all clinical     situations.     eGFR's persistently     <90 mL/min signify     possible Chronic Kidney Disease.  TROPONIN I     Status: None   Collection Time    03/01/13  2:42 AM      Result Value Range   Troponin I <0.30  <0.30 ng/mL   Comment:            Due to the release kinetics of cTnI,     a negative result within the first hours     of the onset of symptoms does not rule out     myocardial infarction with certainty.     If myocardial infarction is still suspected,     repeat the test at appropriate intervals.  CLOSTRIDIUM DIFFICILE BY PCR     Status: None   Collection Time    03/01/13  5:21 AM      Result Value Range   C difficile by pcr NEGATIVE  NEGATIVE    TROPONIN I     Status: None   Collection Time    03/01/13  8:35 AM      Result Value Range   Troponin I <0.30  <0.30 ng/mL   Comment:            Due to the release kinetics of cTnI,     a negative result within the first hours     of the onset of symptoms does not rule out     myocardial infarction with certainty.     If myocardial infarction is still suspected,     repeat the test at appropriate intervals.    Dg Chest 2 View  02/28/2013   *RADIOLOGY REPORT*  Clinical Data: Near-syncope, history hypertension, heart murmur, coronary artery disease, smoking, seizures  CHEST - 2 VIEW  Comparison: 09/19/2012  Findings: Enlargement of cardiac silhouette with pulmonary vascular congestion post CABG. Large hiatal hernia. Mediastinal contours otherwise normal. No acute failure or consolidation. Minimal left basilar atelectasis. No pleural effusion or pneumothorax. Bones demineralized.  IMPRESSION: Enlargement of cardiac silhouette with pulmonary vascular congestion post CABG. Large hiatal hernia with minimal left basilar atelectasis.   Original Report Authenticated By: Ulyses Southward, M.D.   Ct Head Wo  Contrast  02/28/2013   *RADIOLOGY REPORT*  Clinical Data: The patient was unresponsive for 5 minutes.  History of strokes and seizures. Stroke risk factors include hypertension, and cardiac arrhythmia.  CT HEAD WITHOUT CONTRAST  Technique:  Contiguous axial images were obtained from the base of the skull through the vertex without contrast.  Comparison: Prior MR head 09/18/2012.  Prior CT head 09/17/2012.  Findings: No acute stroke or hemorrhage.  No mass lesion or hydrocephalus.  No extra-axial fluid.  Large remote right hemisphere infarct predominantly affects the temporal lobe with resultant asymmetric enlargement of the right lateral ventricle.  The cerebellum is markedly atrophic, to a greater degree than the cerebral hemispheres.  There is extensive chronic microvascular ischemic change along with  gliosis and encephalomalacia related to remote infarct.  The calvarium is intact.  No worrisome osseous lesions.  No acute sinus or mastoid fluid.  Advanced vascular calcification.  Similar appearance to priors.  IMPRESSION: No acute intracranial findings.  Chronic changes as described.   Original Report Authenticated By: Davonna Belling, M.D.    Assessment/Plan 1 syncope-patient presents with a syncopal episode. He does have a history of seizure disorder but typically has an aura prior to these events with associated tonic-clonic activity. He did not have these with his event yesterday. His electrocardiogram shows conduction disease with a first degree AV block and left bundle branch block and heart rate 35. Metoprolol was discontinued 2 weeks ago by his report. Telemetry revealed heart rates intermittently in the high 20s and 30s. There is also a 3 second pause. He will most likely require pacing. I will review this with electrophysiology service for further recommendations. 2 coronary artery disease-continue aspirin. He would benefit from a statin long-term. 3 microcytosis-management per primary care. May need GI evaluation in the future. 4 seizure disorder-continue present medications her primary care. 5 hypertension-Procardia has been discontinued by primary service-follow blood pressure and adjust medications as needed.  Olga Millers MD 03/01/2013, 10:03 AM

## 2013-03-01 NOTE — H&P (Signed)
[facility] History and Physical  [name] YQM:[id number] DOB: Jan 05, 1946 DOA: 02/28/2013  Referring physician: ED PCP: Default, Provider, MD   Chief Complaint: Syncope  HPI: [name] is a 67 y.o. male with PMH significant for seizure disorder and CAD s/p CABG who presents to the ED after a witnessed syncopal episode.  Patient apparently had been sitting on his porch with his brother when he stood up and went to go inside.  When he reached for the door knob he slumped over.  Brother caught him but didn't notice any seizure like activity (usually patient has obvious grand mal seizures).  He was unresponsive for about 1 min before he woke up somewhat confused.  In the ED, work up was remarkable for sinus bradycardia as low as 35 at one point on the monitor and maintaining consistently in the low to mid 40s although patient is asymptomatic while lying down and has BP of 160s.  Review of Systems: The patient now admits to several episodes of syncope over the past couple of weeks.  Some of these episodes have been proceeded by his usual aura symptoms he gets prior to a seizure he states.  Also of significance one of his blood pressure medications has been halved in recent weeks by his PCP at the [place] he states BECAUSE of concerns about slow heart rate.  It is also currently about 90 degrees in his house per his brother due to his [employer] not being on / not working.  12 systems reviewed and otherwise negative.  Past Medical History  Diagnosis Date  . Hypertension   . BPH (benign prostatic hyperplasia)   . Syncope and collapse 08/07/2012    "w/loss of consciousness; found down; don't know how long he was out" (08/07/2012)  . Heart murmur     "don't exist now" (08/07/2012)  . H/O hiatal hernia   . Seizures     "h/o gran mal & petit" (08/07/2012)  . Stroke 1980's    "mini w/seizure at the same time" (08/07/2012)  . Coronary artery disease   . LBBB (left bundle branch block)    Past  Surgical History  Procedure Laterality Date  . Coronary artery bypass graft  ~ 2010    CABG X1  . Tibia fracture surgery  1960's    RLE   Social History:  reports that he has never smoked. He has never used smokeless tobacco. He reports that he does not drink alcohol or use illicit drugs.   Allergies  Allergen Reactions  . Codeine Rash    History reviewed. No pertinent family history.  Prior to Admission medications   Medication Sig Start Date End Date Taking? Authorizing Provider  acetaminophen (TYLENOL) 325 MG tablet Take 650 mg by mouth every 6 (six) hours as needed. For pain   Yes Historical Provider, MD  albuterol (PROVENTIL HFA;VENTOLIN HFA) 108 (90 BASE) MCG/ACT inhaler Inhale 2 puffs into the lungs every 6 (six) hours as needed for wheezing.   Yes Historical Provider, MD  aspirin 81 MG chewable tablet Chew 81 mg by mouth daily.   Yes Historical Provider, MD  carboxymethylcellulose 1 % ophthalmic solution Place 1 drop into both eyes 5 (five) times daily.   Yes Historical Provider, MD  dorzolamide-timolol (COSOPT) 22.3-6.8 MG/ML ophthalmic solution Place 1 drop into both eyes 2 (two) times daily.   Yes Historical Provider, MD  finasteride (PROSCAR) 5 MG tablet Take 5 mg by mouth daily.   Yes Historical Provider, MD  hydrochlorothiazide (HYDRODIURIL) 25 MG tablet Take 25 mg by mouth daily.   Yes Historical Provider, MD  latanoprost (XALATAN) 0.005 % ophthalmic solution Place 1 drop into both eyes at bedtime.   Yes Historical Provider, MD  levETIRAcetam (KEPPRA) 500 MG tablet Take 1.5 tablets (750 mg total) by mouth 2 (two) times daily. 10/12/12  Yes Vida Roller, MD  Multiple Vitamin (MULTIVITAMIN WITH MINERALS) TABS Take 1 tablet by mouth daily.   Yes Historical Provider, MD  NIFEdipine (ADALAT CC) 90 MG 24 hr tablet Take 90 mg by mouth daily.   Yes Historical Provider, MD  omeprazole (PRILOSEC) 20 MG capsule Take 20 mg by mouth daily.   Yes Historical Provider, MD  potassium  chloride SA (K-DUR,KLOR-CON) 20 MEQ tablet Take 20 mEq by mouth 2 (two) times daily.   Yes Historical Provider, MD  terazosin (HYTRIN) 10 MG capsule Take 10 mg by mouth at bedtime.   Yes Historical Provider, MD   Physical Exam: Filed Vitals:   02/28/13 2145 02/28/13 2257 02/28/13 2333 03/01/13 0005  BP: 166/69 172/78 169/96 154/71  Pulse: 118 117 62 70  Temp:    97.6 F (36.4 C)  TempSrc:    Oral  Resp: 17 16 13 16   Height:    5\' 6"  (1.676 m)  Weight:    69.899 kg (154 lb 1.6 oz)  SpO2: 97% 97% 99% 98%    General:  NAD, resting comfortably in bed Eyes: PEERLA EOMI ENT: mucous membranes moist Neck: supple w/o JVD Cardiovascular: bradycardic, irregular w/o MRG Respiratory: CTA B Abdomen: soft, nt, nd, bs+ Skin: no rash nor lesion Musculoskeletal: MAE, full ROM all 4 extremities Psychiatric: normal tone and affect Neurologic: AAOx3, grossly non-focal  Labs on Admission:  Basic Metabolic Panel:  Recent Labs Lab 02/28/13 1826  NA 138  K 3.3*  CL 100  CO2 26  GLUCOSE 102*  BUN 15  CREATININE 1.03  CALCIUM 9.4   Liver Function Tests: No results found for this basename: AST, ALT, ALKPHOS, BILITOT, PROT, ALBUMIN,  in the last 168 hours No results found for this basename: LIPASE, AMYLASE,  in the last 168 hours No results found for this basename: AMMONIA,  in the last 168 hours CBC:  Recent Labs Lab 02/28/13 1826  WBC 8.2  HGB 14.4  HCT 40.5  MCV 75.0*  PLT 176   Cardiac Enzymes: No results found for this basename: CKTOTAL, CKMB, CKMBINDEX, TROPONINI,  in the last 168 hours  BNP (last 3 results) No results found for this basename: PROBNP,  in the last 8760 hours CBG: No results found for this basename: GLUCAP,  in the last 168 hours  Radiological Exams on Admission: Dg Chest 2 View  02/28/2013   *RADIOLOGY REPORT*  Clinical Data: Near-syncope, history hypertension, heart murmur, coronary artery disease, smoking, seizures  CHEST - 2 VIEW  Comparison:  09/19/2012  Findings: Enlargement of cardiac silhouette with pulmonary vascular congestion post CABG. Large hiatal hernia. Mediastinal contours otherwise normal. No acute failure or consolidation. Minimal left basilar atelectasis. No pleural effusion or pneumothorax. Bones demineralized.  IMPRESSION: Enlargement of cardiac silhouette with pulmonary vascular congestion post CABG. Large hiatal hernia with minimal left basilar atelectasis.   Original Report Authenticated By: Ulyses Southward, M.D.   Ct Head Wo Contrast  02/28/2013   *RADIOLOGY REPORT*  Clinical Data: The patient was unresponsive for 5 minutes.  History of strokes and seizures. Stroke risk factors include hypertension, and cardiac arrhythmia.  CT HEAD WITHOUT CONTRAST  Technique:  Contiguous axial images were obtained from the base of the skull through the vertex without contrast.  Comparison: Prior MR head 09/18/2012.  Prior CT head 09/17/2012.  Findings: No acute stroke or hemorrhage.  No mass lesion or hydrocephalus.  No extra-axial fluid.  Large remote right hemisphere infarct predominantly affects the temporal lobe with resultant asymmetric enlargement of the right lateral ventricle.  The cerebellum is markedly atrophic, to a greater degree than the cerebral hemispheres.  There is extensive chronic microvascular ischemic change along with gliosis and encephalomalacia related to remote infarct.  The calvarium is intact.  No worrisome osseous lesions.  No acute sinus or mastoid fluid.  Advanced vascular calcification.  Similar appearance to priors.  IMPRESSION: No acute intracranial findings.  Chronic changes as described.   Original Report Authenticated By: Davonna Belling, M.D.    EKG: Independently reviewed.  LBBB which is old, sinus rhythm, bradycardia down as low as 35 on one EKG, 1st degree AV block only however as far as I am able to tell, I do not observe any P waves without a QRS complex after them (which would indicate a higher degree of  block).  Assessment/Plan Principal Problem:   Syncope Active Problems:   Seizure disorder   Bradycardia   1. Syncope - While this could be from a number of different causes, will address the 2 most obvious ones on the DDX here first: 1. Bradycardia - sustained bradycardia in the ED in the 40s and at times as low as 35!  Certainly could cause orthostatic hypotension (especially with his BP meds and dehydration).  Stopping his CCB, need to monitor what his heart rate does, if remains bradycardic then will likely need cards / ECP eval.  Patient on tele. 2. Seizure disorder - putting patient on seizure precautions, patient could have had a seizure causing syncope, he has a long and extensive history of grand mal seizures causing syncopal episodes int he past. 3. Other possibilities may include: ACS (doubt it, no chest pain, neg trops), dehydration (strong possibility, his house is 90 degrees due to his AC being out).    Code Status: Full Code (must indicate code status--if unknown or must be presumed, indicate so) Family Communication: No family at bedside (indicate person spoken with, if applicable, with phone number if by telephone) Disposition Plan: Admit to obs (indicate anticipated LOS)  Time spent: 70 min  GARDNER, JARED M. [facility] Pager [phone]  If 7PM-7AM, please contact night-coverage [email or web address] Password [email or web address] 03/01/2013, 2:10 AM

## 2013-03-02 ENCOUNTER — Inpatient Hospital Stay (HOSPITAL_COMMUNITY): Payer: Medicare Other

## 2013-03-02 DIAGNOSIS — R55 Syncope and collapse: Secondary | ICD-10-CM

## 2013-03-02 LAB — BASIC METABOLIC PANEL
BUN: 33 mg/dL — ABNORMAL HIGH (ref 6–23)
CO2: 22 mEq/L (ref 19–32)
Chloride: 112 mEq/L (ref 96–112)
GFR calc Af Amer: >90 mL/min (ref >90)
Potassium: 4.9 mEq/L (ref 3.5–5.1)

## 2013-03-02 NOTE — Discharge Summary (Addendum)
Physician Discharge Summary  QUINDARIUS CABELLO MRN: [id number] DOB/AGE: 03/16/1946 67 y.o.  PCP: Default, Provider, MD   Admit date: 02/28/2013 Discharge date: 03/02/2013  Discharge Diagnoses:    Postural syncope Left bundle branch block First-degree AV block Active Problems:   Seizure disorder   Bradycardia     Medication List    STOP taking these medications       hydrochlorothiazide 25 MG tablet  Commonly known as:  HYDRODIURIL     latanoprost 0.005 % ophthalmic solution  Commonly known as:  XALATAN     NIFEdipine 90 MG 24 hr tablet  Commonly known as:  ADALAT CC     terazosin 10 MG capsule  Commonly known as:  HYTRIN      TAKE these medications       acetaminophen 325 MG tablet  Commonly known as:  TYLENOL  Take 650 mg by mouth every 6 (six) hours as needed. For pain     albuterol 108 (90 BASE) MCG/ACT inhaler  Commonly known as:  PROVENTIL HFA;VENTOLIN HFA  Inhale 2 puffs into the lungs every 6 (six) hours as needed for wheezing.     aspirin 81 MG chewable tablet  Chew 81 mg by mouth daily.     carboxymethylcellulose 1 % ophthalmic solution  Place 1 drop into both eyes 5 (five) times daily.     dorzolamide-timolol 22.3-6.8 MG/ML ophthalmic solution  Commonly known as:  COSOPT  Place 1 drop into both eyes 2 (two) times daily.     finasteride 5 MG tablet  Commonly known as:  PROSCAR  Take 5 mg by mouth daily.     levETIRAcetam 500 MG tablet  Commonly known as:  KEPPRA  Take 1.5 tablets (750 mg total) by mouth 2 (two) times daily.     multivitamin with minerals Tabs  Take 1 tablet by mouth daily.     omeprazole 20 MG capsule  Commonly known as:  PRILOSEC  Take 20 mg by mouth daily.     potassium chloride SA 20 MEQ tablet  Commonly known as:  K-DUR,KLOR-CON  Take 20 mEq by mouth 2 (two) times daily.        Discharge Condition: *Stable  Disposition: 01-Home or Self Care   Consults: Cardiology/electrophysiology   Significant  Diagnostic Studies: Dg Chest 2 View  02/28/2013   *RADIOLOGY REPORT*  Clinical Data: Near-syncope, history hypertension, heart murmur, coronary artery disease, smoking, seizures  CHEST - 2 VIEW  Comparison: 09/19/2012  Findings: Enlargement of cardiac silhouette with pulmonary vascular congestion post CABG. Large hiatal hernia. Mediastinal contours otherwise normal. No acute failure or consolidation. Minimal left basilar atelectasis. No pleural effusion or pneumothorax. Bones demineralized.  IMPRESSION: Enlargement of cardiac silhouette with pulmonary vascular congestion post CABG. Large hiatal hernia with minimal left basilar atelectasis.   Original Report Authenticated By: Ulyses Southward, M.D.   Ct Head Wo Contrast  02/28/2013   *RADIOLOGY REPORT*  Clinical Data: The patient was unresponsive for 5 minutes.  History of strokes and seizures. Stroke risk factors include hypertension, and cardiac arrhythmia.  CT HEAD WITHOUT CONTRAST  Technique:  Contiguous axial images were obtained from the base of the skull through the vertex without contrast.  Comparison: Prior MR head 09/18/2012.  Prior CT head 09/17/2012.  Findings: No acute stroke or hemorrhage.  No mass lesion or hydrocephalus.  No extra-axial fluid.  Large remote right hemisphere infarct predominantly affects the temporal lobe with resultant asymmetric enlargement of the right lateral ventricle.  The cerebellum is markedly atrophic, to a greater degree than the cerebral hemispheres.  There is extensive chronic microvascular ischemic change along with gliosis and encephalomalacia related to remote infarct.  The calvarium is intact.  No worrisome osseous lesions.  No acute sinus or mastoid fluid.  Advanced vascular calcification.  Similar appearance to priors.  IMPRESSION: No acute intracranial findings.  Chronic changes as described.   Original Report Authenticated By: Davonna Belling, M.D.     2-D echo LV EF: 45% -  50%  ------------------------------------------------------------ Indications: Syncope 780.2.  ------------------------------------------------------------ History: PMH: Syncope. Risk factors: LBBB. Hypokalemia. Hypertension.  ------------------------------------------------------------ Study Conclusions  - Left ventricle: The cavity size was normal. Wall thickness was increased in a pattern of moderate LVH. Systolic function was mildly reduced. The estimated ejection fraction was in the range of 45% to 50%. Wall motion was normal; there were no regional wall motion abnormalities. Features are consistent with a pseudonormal left ventricular filling pattern, with concomitant abnormal relaxation and increased filling pressure (grade 2 diastolic dysfunction).   Microbiology: Recent Results (from the past 240 hour(s))  CLOSTRIDIUM DIFFICILE BY PCR     Status: None   Collection Time    03/01/13  5:21 AM      Result Value Range Status   C difficile by pcr NEGATIVE  NEGATIVE Final     Labs: Results for orders placed during the hospital encounter of 02/28/13 (from the past 48 hour(s))  CBC     Status: Abnormal   Collection Time    02/28/13  6:26 PM      Result Value Range   WBC 8.2  4.0 - 10.5 K/uL   RBC 5.40  4.22 - 5.81 MIL/uL   Hemoglobin 14.4  13.0 - 17.0 g/dL   HCT 09.8  11.9 - 14.7 %   MCV 75.0 (*) 78.0 - 100.0 fL   MCH 26.7  26.0 - 34.0 pg   MCHC 35.6  30.0 - 36.0 g/dL   RDW 82.9  56.2 - 13.0 %   Platelets 176  150 - 400 K/uL  BASIC METABOLIC PANEL     Status: Abnormal   Collection Time    02/28/13  6:26 PM      Result Value Range   Sodium 138  135 - 145 mEq/L   Potassium 3.3 (*) 3.5 - 5.1 mEq/L   Chloride 100  96 - 112 mEq/L   CO2 26  19 - 32 mEq/L   Glucose, Bld 102 (*) 70 - 99 mg/dL   BUN 15  6 - 23 mg/dL   Creatinine, Ser 8.65  0.50 - 1.35 mg/dL   Calcium 9.4  8.4 - 78.4 mg/dL   GFR calc non Af Amer 72 (*) >90 mL/min   GFR calc Af Amer 84 (*) >90 mL/min    Comment:            The eGFR has been calculated     using the CKD EPI equation.     This calculation has not been     validated in all clinical     situations.     eGFR's persistently     <90 mL/min signify     possible Chronic Kidney Disease.  POCT I-STAT TROPONIN I     Status: None   Collection Time    02/28/13  6:37 PM      Result Value Range   Troponin i, poc 0.03  0.00 - 0.08 ng/mL   Comment 3  Comment: Due to the release kinetics of cTnI,     a negative result within the first hours     of the onset of symptoms does not rule out     myocardial infarction with certainty.     If myocardial infarction is still suspected,     repeat the test at appropriate intervals.  URINALYSIS, DIPSTICK ONLY     Status: Abnormal   Collection Time    02/28/13  6:45 PM      Result Value Range   Specific Gravity, Urine 1.009  1.005 - 1.030   pH 7.0  5.0 - 8.0   Glucose, UA NEGATIVE  NEGATIVE mg/dL   Hgb urine dipstick TRACE (*) NEGATIVE   Bilirubin Urine NEGATIVE  NEGATIVE   Ketones, ur NEGATIVE  NEGATIVE mg/dL   Protein, ur 30 (*) NEGATIVE mg/dL   Urobilinogen, UA 0.2  0.0 - 1.0 mg/dL   Nitrite NEGATIVE  NEGATIVE   Leukocytes, UA NEGATIVE  NEGATIVE  POCT I-STAT TROPONIN I     Status: None   Collection Time    02/28/13  8:55 PM      Result Value Range   Troponin i, poc 0.08  0.00 - 0.08 ng/mL   Comment NOTIFIED PHYSICIAN     Comment 3            Comment: Due to the release kinetics of cTnI,     a negative result within the first hours     of the onset of symptoms does not rule out     myocardial infarction with certainty.     If myocardial infarction is still suspected,     repeat the test at appropriate intervals.  POCT I-STAT TROPONIN I     Status: None   Collection Time    02/28/13 11:50 PM      Result Value Range   Troponin i, poc 0.05  0.00 - 0.08 ng/mL   Comment 3            Comment: Due to the release kinetics of cTnI,     a negative result within the first  hours     of the onset of symptoms does not rule out     myocardial infarction with certainty.     If myocardial infarction is still suspected,     repeat the test at appropriate intervals.  CBC     Status: Abnormal   Collection Time    03/01/13  2:42 AM      Result Value Range   WBC 6.6  4.0 - 10.5 K/uL   RBC 4.88  4.22 - 5.81 MIL/uL   Hemoglobin 12.7 (*) 13.0 - 17.0 g/dL   HCT 45.4 (*) 09.8 - 11.9 %   MCV 75.6 (*) 78.0 - 100.0 fL   MCH 26.0  26.0 - 34.0 pg   MCHC 34.4  30.0 - 36.0 g/dL   RDW 14.7  82.9 - 56.2 %   Platelets 150  150 - 400 K/uL  BASIC METABOLIC PANEL     Status: Abnormal   Collection Time    03/01/13  2:42 AM      Result Value Range   Sodium 139  135 - 145 mEq/L   Potassium 3.0 (*) 3.5 - 5.1 mEq/L   Chloride 103  96 - 112 mEq/L   CO2 27  19 - 32 mEq/L   Glucose, Bld 119 (*) 70 - 99 mg/dL   BUN 15  6 - 23 mg/dL  Creatinine, Ser 0.88  0.50 - 1.35 mg/dL   Calcium 9.0  8.4 - 82.9 mg/dL   GFR calc non Af Amer 86 (*) >90 mL/min   GFR calc Af Amer >90  >90 mL/min   Comment:            The eGFR has been calculated     using the CKD EPI equation.     This calculation has not been     validated in all clinical     situations.     eGFR's persistently     <90 mL/min signify     possible Chronic Kidney Disease.  TROPONIN I     Status: None   Collection Time    03/01/13  2:42 AM      Result Value Range   Troponin I <0.30  <0.30 ng/mL   Comment:            Due to the release kinetics of cTnI,     a negative result within the first hours     of the onset of symptoms does not rule out     myocardial infarction with certainty.     If myocardial infarction is still suspected,     repeat the test at appropriate intervals.  CLOSTRIDIUM DIFFICILE BY PCR     Status: None   Collection Time    03/01/13  5:21 AM      Result Value Range   C difficile by pcr NEGATIVE  NEGATIVE  TROPONIN I     Status: None   Collection Time    03/01/13  8:35 AM      Result Value  Range   Troponin I <0.30  <0.30 ng/mL   Comment:            Due to the release kinetics of cTnI,     a negative result within the first hours     of the onset of symptoms does not rule out     myocardial infarction with certainty.     If myocardial infarction is still suspected,     repeat the test at appropriate intervals.  TROPONIN I     Status: None   Collection Time    03/01/13 10:45 AM      Result Value Range   Troponin I <0.30  <0.30 ng/mL   Comment:            Due to the release kinetics of cTnI,     a negative result within the first hours     of the onset of symptoms does not rule out     myocardial infarction with certainty.     If myocardial infarction is still suspected,     repeat the test at appropriate intervals.  MAGNESIUM     Status: None   Collection Time    03/01/13 10:45 AM      Result Value Range   Magnesium 1.9  1.5 - 2.5 mg/dL  TSH     Status: None   Collection Time    03/01/13 10:45 AM      Result Value Range   TSH 0.533  0.350 - 4.500 uIU/mL  TROPONIN I     Status: None   Collection Time    03/01/13  8:56 PM      Result Value Range   Troponin I <0.30  <0.30 ng/mL   Comment:            Due to the release kinetics  of cTnI,     a negative result within the first hours     of the onset of symptoms does not rule out     myocardial infarction with certainty.     If myocardial infarction is still suspected,     repeat the test at appropriate intervals.     HPI : 67 year old male with a past medical history of hypertension, BPH, coronary artery disease s/p CABG, and seizure disorder (on Keppra).  He has had several episodes recently of altered mental status and has been evaluated at [facility] (last in January of this year). He also has had bradycardia noted recently and his physicians at the [place] discontinued his Metoprolol. It is unclear whether or not he had symptoms related to his bradycardia. This was originally noted by his home health RN.  His brother  states that yesterday, he received a call from the pastor because Mr Kohrs did not recognize him. When he arrived at the home, the patient came to the door and was able to be reoriented. He was still a little confused. According to his brother, there is no air conditioning in the patient's home. He then came outside and was brushing off the banisters, he turned to go inside and slumped against the door. His brother caught him and sat him in the chair. The patient was lethargic and slow to respond and so they called 911. On EMS arrival, he was still lethargic and not responding appropriately. Initial BP on arrival was 176/80 with pulse of 63. EKG shows sinus rhythm with first degree AV block, LBBB, and PVC's. He was transported to [facility] for further evaluation.  On telemetry, he has been noted to have heart rates into the 30's while sleeping. When the patient is awake, his heart rates are in the 60's. He has had no symptoms similar to his episode yesterday since being in the hospital.  Lab work has been remarkable for hypokalemia. TSH pending. Normal troponin.  Last echo 08/2012 demonstrated EF of 45-50% with no RWMA, grade 2 diastolic dysfunction, calcified mitral valve annulus, LA mildly dilated.  The patient denies recent fevers, chills, chest pain, shortness of breath, weight loss or weight gain.  Electrophysiology has been asked to evaluate for treatment options.    HOSPITAL COURSE:  Syncope Part of the postural syncope Bradycardia resolved after discontinuation of metoprolol and calcium channel blocker Seen by Dr. Johney Frame , he does not feel that a pacemaker is indicated at this time He has recommended discontinuation of antihypertensive medications including hydrochlorothiazide, Hytrin He does have first degree AV block and left bundle branch block and eventually need a pacemaker he will need close outpatient cardiology followup No gross focal neurologic deficits but speech appears to be a little  garbled MRI pending Carotid Doppler pending PT eval If Workup negative the patient will discharge home today Needs BMP in one week     Discharge Exam:   Blood pressure 168/79, pulse 68, temperature 97.3 F (36.3 C), temperature source Axillary, resp. rate 18, height 5\' 6"  (1.676 m), weight 72.031 kg (158 lb 12.8 oz), SpO2 98.00%. GEN- The patient is elderly appearing, alert and oriented x 3 today.  Head- normocephalic, atraumatic  Eyes- Sclera clear, conjunctiva pink  Ears- hearing intact  Oropharynx- clear  Neck- supple,  Lungs- Clear to ausculation bilaterally, normal work of breathing  Heart- Regular rate and rhythm  GI- soft, NT, ND, + BS  Extremities- no clubbing, cyanosis, or edema  MS- +  age appropriate muscle atrophy  Skin- no rash or lesion  Psych- euthymic mood, full affect  Neuro- strength and sensation are intact         Signed: Lashawna Poche 03/02/2013, 10:09 AM

## 2013-03-02 NOTE — Progress Notes (Signed)
VASCULAR LAB PRELIMINARY  PRELIMINARY  PRELIMINARY  PRELIMINARY  Carotid Dopplers completed.    Preliminary report:  There is 0-39% ICA stenosis.  Vertebral artery flow is antegrade.  Miracle Criado, RVT 03/02/2013, 10:46 AM

## 2013-03-02 NOTE — Progress Notes (Signed)
   CARE MANAGEMENT NOTE 03/02/2013  Patient:  [name], [name]   Account Number:  [id number]  Date Initiated:  03/02/2013  Documentation initiated by:  [facility]  Subjective/Objective Assessment:   Left bundle branch block, postural syncope, Seizure disorder, Bradycardia     Action/Plan:   lives at home alone   Anticipated DC Date:  03/03/2013   Anticipated DC Plan:  HOME/SELF CARE      DC Planning Services  CM consult      Choice offered to / List presented to:             Status of service:  In process, will continue to follow Medicare Important Message given?   (If response is "NO", the following Medicare IM given date fields will be blank) Date Medicare IM given:   Date Additional Medicare IM given:    Discharge Disposition:    Per UR Regulation:    If discussed at Long Length of Stay Meetings, dates discussed:    Comments:  03/02/2013 1645 NCM placed medical records from [facility] on shadow chart. Pt states he lives at home by himself. States the [employer] Niece takes him to his appt and his church [employer]ena[employer] Niece take him to the grocery store. His brother, Birdie Sons # [phone] helps him at home. NCM will continue to follow up for dc needs. Will fax dc summary to [facility] at [employer]. Isidoro Donning RN  CCM Case Mgmt phone [phone]

## 2013-03-03 DIAGNOSIS — R569 Unspecified convulsions: Secondary | ICD-10-CM

## 2013-03-03 DIAGNOSIS — G9340 Encephalopathy, unspecified: Secondary | ICD-10-CM

## 2013-03-03 MED ORDER — ATORVASTATIN CALCIUM 10 MG PO TABS: 10.0000 mg | ORAL_TABLET | Freq: Every day | ORAL | Status: DC

## 2013-03-03 NOTE — Discharge Summary (Addendum)
Physician Discharge Summary  [name]:[id number] DOB: 08-05-1946 DOA: 02/28/2013  PCP: Default, Provider, MD  Admit date: 02/28/2013 Discharge date: 03/03/2013  Time spent: 40 minutes  Recommendations for Outpatient Follow-up:  1. Syncope: Resolved D/C in Am 2. Seizure WJ:XBJY Keppra F/U with Neurologist 3. HTN: Will allow to run high 2dary to Syncopal event PCP to adjust 4. Bradycardia; Per Dr Hillis Range Asymptomatic bradycardia/ first degree AV block/ LBBB  His bradycardia has been asymptomatic and mostly while sleeping (he reports being asleep this am until 9:30am). We will ambulate in the halls to evaluate heart rate response/ AV conduction. Though he does not meet criteria for pacemaker at this time, he may eventually require PPM if symptoms develop. Will start Statin per Cardiology 5.    Discharge Diagnoses:  Principal Problem:   Syncope Active Problems:   Hypertension   Seizure disorder   Bradycardia   Discharge Condition:Stable  Diet recommendation: Heart Healthy  Filed Weights   03/01/13 0005 03/02/13 0400 03/03/13 0534  Weight: 69.899 kg (154 lb 1.6 oz) 72.031 kg (158 lb 12.8 oz) 73.755 kg (162 lb 9.6 oz)    History of present illness:  [name] is a 67 y.o. WM PMHx seizure disorder and CAD s/p CABG who presents to the ED after a witnessed syncopal episode. Patient apparently had been sitting on his porch with his brother when he stood up and went to go inside. When he reached for the door knob he slumped over. Brother caught him but didn't notice any seizure like activity (usually patient has obvious grand mal seizures). He was unresponsive for about 1 min before he woke up somewhat confused. In the ED, work up was remarkable for sinus bradycardia as low as 35 at one point on the monitor and maintaining consistently in the low to mid 40s although patient is asymptomatic while lying down and has BP of 160s.TODAY states ready for discharge, will followup  at [facility] Course:  Pt admitted and BP medication   Procedures:    Consultations:  Electrophysiology, Cardiology  Discharge Exam: Filed Vitals:   03/02/13 2137 03/02/13 2215 03/03/13 0534 03/03/13 1519  BP: 163/85 158/82 140/80 160/76  Pulse: 93  61 77  Temp: 97.9 F (36.6 C)  98.4 F (36.9 C) 98.2 F (36.8 C)  TempSrc: Oral  Oral Oral  Resp: 18  18 20   Height:      Weight:   73.755 kg (162 lb 9.6 oz)   SpO2: 99%  99% 100%    General: A/O x4, NAD,no change from 6/29 Cardiovascular:RRR, (-) M/R/G, DP/PT pulse 2+ in LLE, 1+ in RLE, no change from 6/29 Respiratory: CTA bilat, no change from 6/29  Discharge Instructions     Medication List    STOP taking these medications       hydrochlorothiazide 25 MG tablet  Commonly known as:  HYDRODIURIL     latanoprost 0.005 % ophthalmic solution  Commonly known as:  XALATAN     NIFEdipine 90 MG 24 hr tablet  Commonly known as:  ADALAT CC     terazosin 10 MG capsule  Commonly known as:  HYTRIN      TAKE these medications       acetaminophen 325 MG tablet  Commonly known as:  TYLENOL  Take 650 mg by mouth every 6 (six) hours as needed. For pain     albuterol 108 (90 BASE) MCG/ACT inhaler  Commonly known as:  PROVENTIL HFA;VENTOLIN HFA  Inhale 2 puffs into the lungs every 6 (six) hours as needed for wheezing.     aspirin 81 MG chewable tablet  Chew 81 mg by mouth daily.     carboxymethylcellulose 1 % ophthalmic solution  Place 1 drop into both eyes 5 (five) times daily.     dorzolamide-timolol 22.3-6.8 MG/ML ophthalmic solution  Commonly known as:  COSOPT  Place 1 drop into both eyes 2 (two) times daily.     finasteride 5 MG tablet  Commonly known as:  PROSCAR  Take 5 mg by mouth daily.     levETIRAcetam 500 MG tablet  Commonly known as:  KEPPRA  Take 1.5 tablets (750 mg total) by mouth 2 (two) times daily.     multivitamin with minerals Tabs  Take 1 tablet by mouth daily.     omeprazole 20  MG capsule  Commonly known as:  PRILOSEC  Take 20 mg by mouth daily.     potassium chloride SA 20 MEQ tablet  Commonly known as:  K-DUR,KLOR-CON  Take 20 mEq by mouth 2 (two) times daily.       Allergies  Allergen Reactions  . Codeine Rash      The results of significant diagnostics from this hospitalization (including imaging, microbiology, ancillary and laboratory) are listed below for reference.    Significant Diagnostic Studies: Dg Chest 2 View  02/28/2013   *RADIOLOGY REPORT*  Clinical Data: Near-syncope, history hypertension, heart murmur, coronary artery disease, smoking, seizures  CHEST - 2 VIEW  Comparison: 09/19/2012  Findings: Enlargement of cardiac silhouette with pulmonary vascular congestion post CABG. Large hiatal hernia. Mediastinal contours otherwise normal. No acute failure or consolidation. Minimal left basilar atelectasis. No pleural effusion or pneumothorax. Bones demineralized.  IMPRESSION: Enlargement of cardiac silhouette with pulmonary vascular congestion post CABG. Large hiatal hernia with minimal left basilar atelectasis.   Original Report Authenticated By: Ulyses Southward, M.D.   Ct Head Wo Contrast  02/28/2013   *RADIOLOGY REPORT*  Clinical Data: The patient was unresponsive for 5 minutes.  History of strokes and seizures. Stroke risk factors include hypertension, and cardiac arrhythmia.  CT HEAD WITHOUT CONTRAST  Technique:  Contiguous axial images were obtained from the base of the skull through the vertex without contrast.  Comparison: Prior MR head 09/18/2012.  Prior CT head 09/17/2012.  Findings: No acute stroke or hemorrhage.  No mass lesion or hydrocephalus.  No extra-axial fluid.  Large remote right hemisphere infarct predominantly affects the temporal lobe with resultant asymmetric enlargement of the right lateral ventricle.  The cerebellum is markedly atrophic, to a greater degree than the cerebral hemispheres.  There is extensive chronic microvascular  ischemic change along with gliosis and encephalomalacia related to remote infarct.  The calvarium is intact.  No worrisome osseous lesions.  No acute sinus or mastoid fluid.  Advanced vascular calcification.  Similar appearance to priors.  IMPRESSION: No acute intracranial findings.  Chronic changes as described.   Original Report Authenticated By: Davonna Belling, M.D.   Mr Brain Wo Contrast  03/02/2013   *RADIOLOGY REPORT*  Clinical Data: Altered mental status  MRI HEAD WITHOUT CONTRAST  Technique:  Multiplanar, multiecho pulse sequences of the brain and surrounding structures were obtained according to standard protocol without intravenous contrast.  Comparison: Head CT 02/28/2013.  MRI 09/18/2012.  Findings: Diffusion imaging does not show any acute or subacute infarction.  There is advanced generalized brain atrophy.  No focal cerebellar or brain stem insult.  There is old infarction affecting the right  temporal lobe which has progressed atrophy and encephalomalacia with ex vacuo enlargement of the temporal horn of the right lateral ventricle.  Chronic small vessel changes effect the cerebral hemispheric white matter.  No mass lesion, hemorrhage, obstructive hydrocephalus or extra-axial collection.  No pituitary mass.  No fluid in the sinuses, middle ears or mastoids.  IMPRESSION: Advanced generalized brain atrophy.  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 No acute or reversible findings.   Original Report Authenticated By: Paulina Fusi, M.D.    Microbiology: Recent Results (from the past 240 hour(s))  CLOSTRIDIUM DIFFICILE BY PCR     Status: None   Collection Time    03/01/13  5:21 AM      Result Value Range Status   C difficile by pcr NEGATIVE  NEGATIVE Final     Labs: Basic Metabolic Panel:  Recent Labs Lab 02/28/13 1826 03/01/13 0242 03/01/13 1045 03/02/13 1209  NA 138 139  --  141  K 3.3* 3.0*  --  4.9  CL 100 103  --  112  CO2 26 27  --  22  GLUCOSE 102* 119*  --  109*  BUN 15 15  --  33*   CREATININE 1.03 0.88  --  0.93  CALCIUM 9.4 9.0  --  8.5  MG  --   --  1.9  --    Liver Function Tests: No results found for this basename: AST, ALT, ALKPHOS, BILITOT, PROT, ALBUMIN,  in the last 168 hours No results found for this basename: LIPASE, AMYLASE,  in the last 168 hours No results found for this basename: AMMONIA,  in the last 168 hours CBC:  Recent Labs Lab 02/28/13 1826 03/01/13 0242  WBC 8.2 6.6  HGB 14.4 12.7*  HCT 40.5 36.9*  MCV 75.0* 75.6*  PLT 176 150   Cardiac Enzymes:  Recent Labs Lab 03/01/13 0242 03/01/13 0835 03/01/13 1045 03/01/13 2056  TROPONINI <0.30 <0.30 <0.30 <0.30   BNP: BNP (last 3 results) No results found for this basename: PROBNP,  in the last 8760 hours CBG: No results found for this basename: GLUCAP,  in the last 168 hours     Signed:  Carolyne Littles, J  [facility] 03/03/2013, 6:56 PM

## 2013-03-04 MED ORDER — LOVASTATIN 20 MG PO TABS: 20.0000 mg | ORAL_TABLET | Freq: Every day | ORAL | Status: DC

## 2013-03-04 MED ORDER — LEVETIRACETAM 750 MG PO TABS: 750.0000 mg | ORAL_TABLET | Freq: Two times a day (BID) | ORAL | Status: DC
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
## 2013-03-04 NOTE — Care Management (Signed)
CM did speak to pt and MD Joseph Art in reference to medications. Pt originally had order for lipitor and now the med has been changed to $4.00 lovastatin to be purchased at [facility]. The statin is the only new medication. Then pt can f/u with the [facility] in [place] for purchase of meds. Per pt has other medications at home. Gala Lewandowsky, RN,BSN [phone]
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
## 2013-03-04 NOTE — Progress Notes (Signed)
Patient given discharge instructions and prescriptions.  All questions answered.  Patient escorted by volunteer services to brother's vehicle.

## 2013-03-04 NOTE — ED Provider Notes (Signed)
I saw and evaluated the patient, reviewed the resident's note and I agree with the findings and plan. [facility] to admit. Pt with increasing number of syncopal episodes and falls.   Loren Racer, MD 03/04/13 [phone]

## 2013-03-04 NOTE — Progress Notes (Signed)
Per Steward Drone with Case Management, patient needs a home health PT/OT order with face to face evaluation.  Dr. Joseph Art notified and states that he will address the order.  Will continue to monitor.

## 2013-03-04 NOTE — Care Management (Signed)
Referral made to [facility] for PT/OT fore safety evaluation and treatment. SOC to begin within 24-48 hours post d/c. No further needs from CM at this time. Gala Lewandowsky, RN,BSN [phone]

## 2014-10-31 IMAGING — CR DG CHEST 1V PORT
1 series · 1 of 1 positions shown · non-contrast
Comparison: Portable exam 2024 hours compared to 10/31/2006

CLINICAL DATA: Loss of consciousness, history seizures,
hypertension

PORTABLE CHEST - 1 VIEW

[AP]
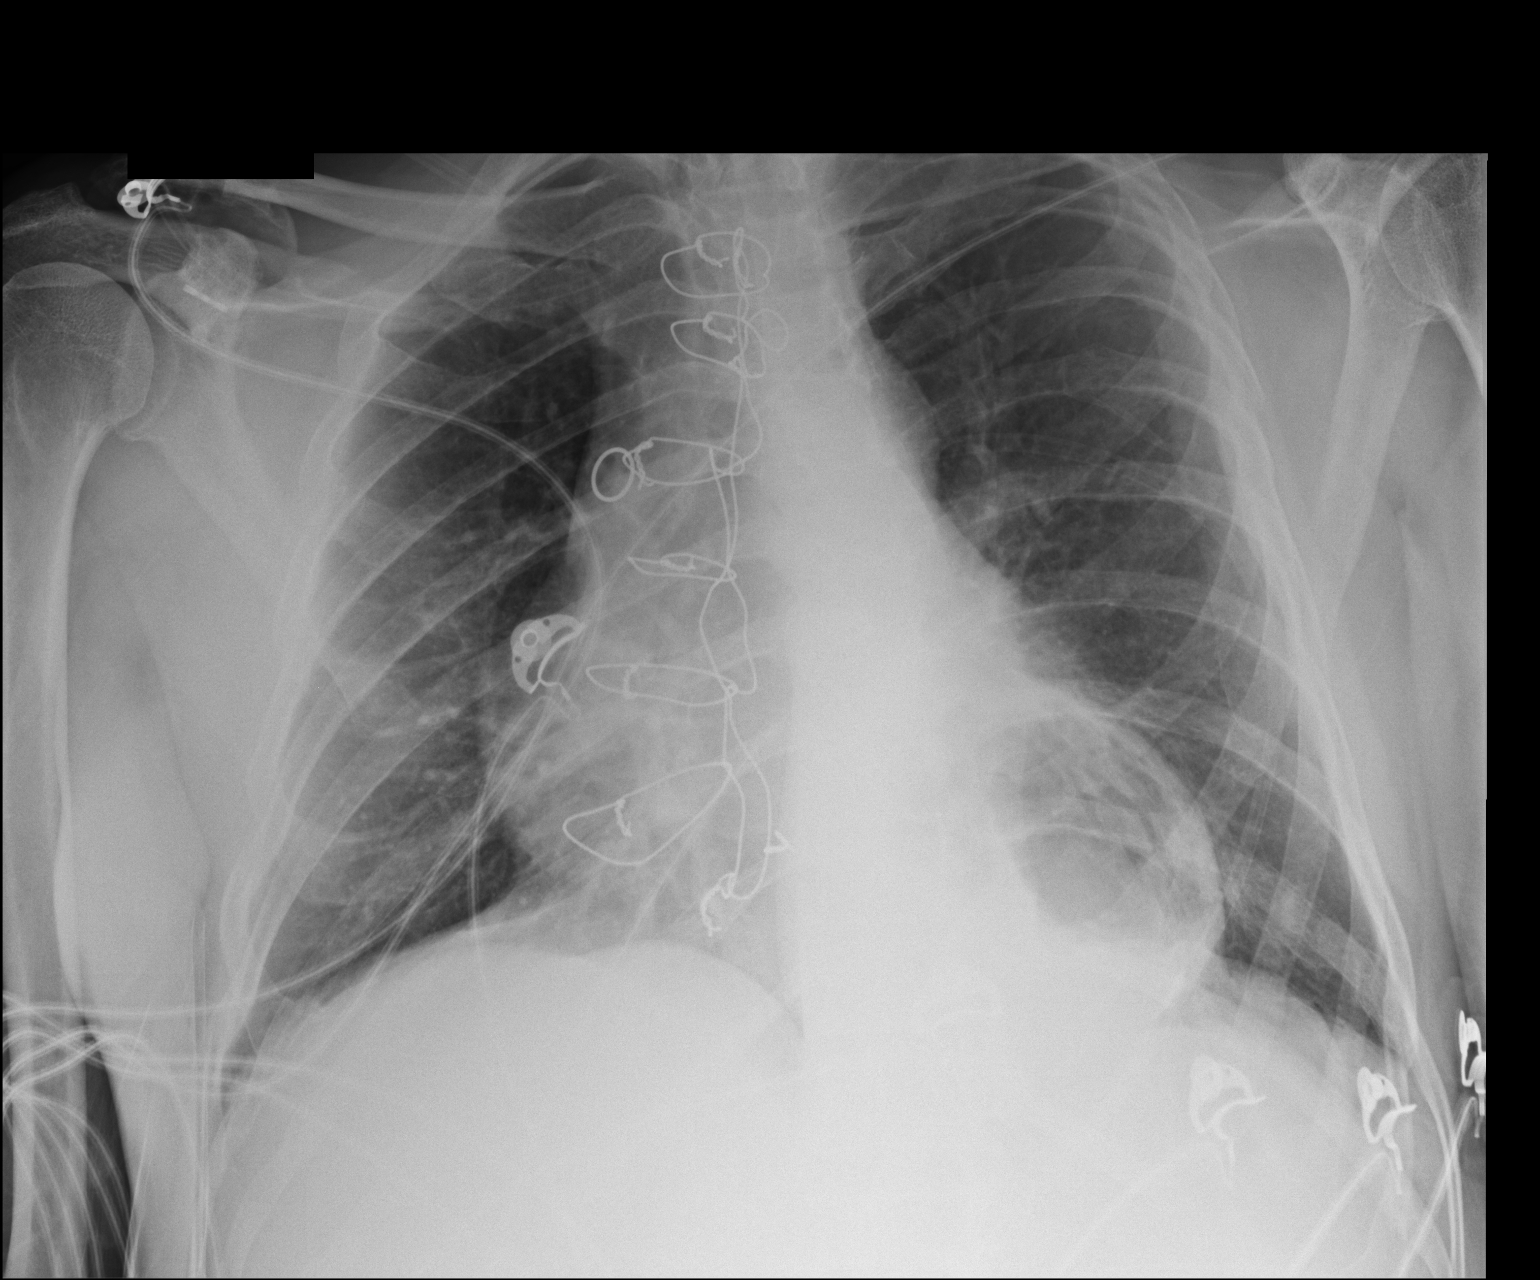

[1 of 1 positions shown; findings below may reference images not displayed]

FINDINGS: Enlargement of cardiac silhouette post CABG.
Large hiatal hernia.
Rotated to the right.
Mediastinal contours and pulmonary vascularity normal.
Lungs clear.
No pleural effusion or pneumothorax.
Bones unremarkable.
IMPRESSION: Enlargement of cardiac silhouette post CABG.
Large hiatal hernia.

## 2014-12-12 ENCOUNTER — Emergency Department (HOSPITAL_COMMUNITY)
Admission: EM | Admit: 2014-12-12 | Discharge: 2014-12-13 | Disposition: A | Discharge status: Home / Self Care | Payer: Medicare Other | Attending: Emergency Medicine | Admitting: Emergency Medicine

## 2014-12-12 ENCOUNTER — Encounter (HOSPITAL_COMMUNITY): Payer: Self-pay | Admitting: Emergency Medicine

## 2014-12-12 ENCOUNTER — Emergency Department (HOSPITAL_COMMUNITY): Payer: Medicare Other

## 2014-12-12 DIAGNOSIS — Z951 Presence of aortocoronary bypass graft: Secondary | ICD-10-CM | POA: Insufficient documentation

## 2014-12-12 DIAGNOSIS — Z79899 Other long term (current) drug therapy: Secondary | ICD-10-CM | POA: Insufficient documentation

## 2014-12-12 DIAGNOSIS — G40909 Epilepsy, unspecified, not intractable, without status epilepticus: Secondary | ICD-10-CM | POA: Insufficient documentation

## 2014-12-12 DIAGNOSIS — R0602 Shortness of breath: Secondary | ICD-10-CM | POA: Insufficient documentation

## 2014-12-12 DIAGNOSIS — Z8673 Personal history of transient ischemic attack (TIA), and cerebral infarction without residual deficits: Secondary | ICD-10-CM | POA: Insufficient documentation

## 2014-12-12 DIAGNOSIS — N400 Enlarged prostate without lower urinary tract symptoms: Secondary | ICD-10-CM | POA: Diagnosis not present

## 2014-12-12 DIAGNOSIS — Z8719 Personal history of other diseases of the digestive system: Secondary | ICD-10-CM | POA: Diagnosis not present

## 2014-12-12 DIAGNOSIS — I10 Essential (primary) hypertension: Secondary | ICD-10-CM | POA: Diagnosis present

## 2014-12-12 DIAGNOSIS — I2510 Atherosclerotic heart disease of native coronary artery without angina pectoris: Secondary | ICD-10-CM | POA: Diagnosis not present

## 2014-12-12 DIAGNOSIS — Z7982 Long term (current) use of aspirin: Secondary | ICD-10-CM | POA: Insufficient documentation

## 2014-12-12 LAB — I-STAT CHEM 8, ED
BUN: 21 mg/dL (ref 6–23)
CALCIUM ION: 1.22 mmol/L (ref 1.13–1.30)
CREATININE: 1.2 mg/dL (ref 0.50–1.35)
Chloride: 102 mmol/L (ref 96–112)
Glucose, Bld: 93 mg/dL (ref 70–99)
HCT: 48 % (ref 39.0–52.0)
Hemoglobin: 16.3 g/dL (ref 13.0–17.0)
Potassium: 4 mmol/L (ref 3.5–5.1)
SODIUM: 144 mmol/L (ref 135–145)
TCO2: 26 mmol/L (ref 0–100)

## 2014-12-12 LAB — CBC WITH DIFFERENTIAL/PLATELET
BASOS ABS: 0 10*3/uL (ref 0.0–0.1)
BASOS PCT: 0 % (ref 0–1)
EOS ABS: 0.2 10*3/uL (ref 0.0–0.7)
Eosinophils Relative: 3 % (ref 0–5)
HEMATOCRIT: 44.3 % (ref 39.0–52.0)
HEMOGLOBIN: 14.8 g/dL (ref 13.0–17.0)
LYMPHS ABS: 1.9 10*3/uL (ref 0.7–4.0)
Lymphocytes Relative: 23 % (ref 12–46)
MCH: 27 pg (ref 26.0–34.0)
MCHC: 33.4 g/dL (ref 30.0–36.0)
MCV: 80.8 fL (ref 78.0–100.0)
MONO ABS: 1.2 10*3/uL — AB (ref 0.1–1.0)
MONOS PCT: 15 % — AB (ref 3–12)
NEUTROS ABS: 4.8 10*3/uL (ref 1.7–7.7)
NEUTROS PCT: 59 % (ref 43–77)
Platelets: 199 10*3/uL (ref 150–400)
RBC: 5.48 MIL/uL (ref 4.22–5.81)
RDW: 13.7 % (ref 11.5–15.5)
WBC: 8.1 10*3/uL (ref 4.0–10.5)

## 2014-12-12 LAB — I-STAT TROPONIN, ED: Troponin i, poc: 0.06 ng/mL (ref 0.00–0.08)

## 2014-12-12 LAB — BRAIN NATRIURETIC PEPTIDE: B Natriuretic Peptide: 177.6 pg/mL — ABNORMAL HIGH (ref 0.0–100.0)

## 2014-12-12 MED ORDER — CLONIDINE HCL 0.1 MG PO TABS
0.1000 mg | ORAL_TABLET | Freq: Once | ORAL | Status: AC
  Administered 2014-12-12: 0.1 mg via ORAL
  Filled 2014-12-12: qty 1

## 2014-12-12 MED ORDER — LISINOPRIL 10 MG PO TABS
10.0000 mg | ORAL_TABLET | Freq: Once | ORAL | Status: AC
  Administered 2014-12-12: 10 mg via ORAL
  Filled 2014-12-12: qty 1

## 2014-12-12 NOTE — ED Notes (Signed)
[facility] presents with a 69 yo male c/o hypertension.  Pt took BP meds this morning including HCTZ and was running errands all day today.  Pt stated that he got home and checked BP and it was above 200 systolic.  Pt call PCP and PCP advised to call 911 if systolic pressure continued to be elevated.  Pt took BP several times and called 911.  Manual BP taken by [facility] was 210/120.  No c/o chest pain, discomfort, SOB, lightheadedness, dizziness or blurred vision. Pt states he just wants BP to be down.

## 2014-12-12 NOTE — ED Notes (Signed)
Bed: WA03 Expected date:  Expected time:  Means of arrival:  Comments: Ems hypotension

## 2014-12-12 NOTE — ED Notes (Signed)
Pt's brother is at bedside and stated that he feels that he is disoriented and "not together"

## 2014-12-12 NOTE — ED Provider Notes (Signed)
CSN: [id number]     Arrival date & time 12/12/14  1906 History   First MD Initiated Contact with Patient 12/12/14 2025     Chief Complaint  Patient presents with  . Hypertension     (Consider location/radiation/quality/duration/timing/severity/associated sxs/prior Treatment) HPI Comments: Patient presents to the emergency department with chief complaint of hypertension and shortness of breath. States that he has felt short of breath for the past several days. States that appear improved a little today. He also states that he has had high blood pressure today complaining of it being in the 200s systolic. Reports that it was as high as 210/120. Patient denies any chest pain, dizziness, blurred vision, or abdominal pain.  The history is provided by the patient. No language interpreter was used.    Past Medical History  Diagnosis Date  . Hypertension   . BPH (benign prostatic hyperplasia)   . Syncope and collapse 08/07/2012    "w/loss of consciousness; found down; don't know how long he was out" (08/07/2012)  . H/O hiatal hernia   . Seizures     "h/o gran mal & petit" (08/07/2012)  . Stroke 1980's    "mini w/seizure at the same time" (08/07/2012)  . Coronary artery disease   . LBBB (left bundle branch block)    Past Surgical History  Procedure Laterality Date  . Coronary artery bypass graft  ~ 2010    CABG X1  . Tibia fracture surgery  1960's    RLE   Family History  Problem Relation Age of Onset  . Heart disease      No family history   History  Substance Use Topics  . Smoking status: Never Smoker   . Smokeless tobacco: Never Used  . Alcohol Use: No    Review of Systems  Constitutional: Negative for fever and chills.  Respiratory: Negative for shortness of breath.   Cardiovascular: Negative for chest pain.  Gastrointestinal: Negative for nausea, vomiting, diarrhea and constipation.  Genitourinary: Negative for dysuria.  All other systems reviewed and are  negative.     Allergies  Codeine  Home Medications   Prior to Admission medications   Medication Sig Start Date End Date Taking? Authorizing Provider  alum hydroxide-mag trisilicate (GAVISCON) 80-20 MG CHEW chewable tablet Chew 1 tablet by mouth at bedtime.   Yes Historical Provider, MD  aspirin 81 MG chewable tablet Chew 81 mg by mouth daily.   Yes Historical Provider, MD  carboxymethylcellulose 1 % ophthalmic solution Place 1 drop into both eyes 5 (five) times daily.   Yes Historical Provider, MD  dorzolamide-timolol (COSOPT) 22.3-6.8 MG/ML ophthalmic solution Place 1 drop into both eyes 2 (two) times daily.   Yes Historical Provider, MD  finasteride (PROSCAR) 5 MG tablet Take 5 mg by mouth daily.   Yes Historical Provider, MD  hydrochlorothiazide (HYDRODIURIL) 25 MG tablet Take 25 mg by mouth daily.   Yes Historical Provider, MD  levETIRAcetam (KEPPRA) 750 MG tablet Take 1 tablet (750 mg total) by mouth 2 (two) times daily. 03/04/13  Yes Drema Dallas, MD  lovastatin (MEVACOR) 20 MG tablet Take 1 tablet (20 mg total) by mouth at bedtime. 03/04/13  Yes Drema Dallas, MD  Multiple Vitamin (MULTIVITAMIN WITH MINERALS) TABS Take 1 tablet by mouth daily.   Yes Historical Provider, MD  omeprazole (PRILOSEC) 20 MG capsule Take 20 mg by mouth daily.   Yes Historical Provider, MD  potassium chloride SA (K-DUR,KLOR-CON) 20 MEQ tablet Take 20 mEq by mouth  2 (two) times daily.   Yes Historical Provider, MD  acetaminophen (TYLENOL) 325 MG tablet Take 650 mg by mouth every 6 (six) hours as needed. For pain    Historical Provider, MD  albuterol (PROVENTIL HFA;VENTOLIN HFA) 108 (90 BASE) MCG/ACT inhaler Inhale 2 puffs into the lungs every 6 (six) hours as needed for wheezing.    Historical Provider, MD   BP 181/88 mmHg  Pulse 74  Temp(Src) 98.3 F (36.8 C) (Oral)  Resp 21  SpO2 95% Physical Exam  Constitutional: He is oriented to person, place, and time. He appears well-developed and  well-nourished.  HENT:  Head: Normocephalic and atraumatic.  Eyes: Conjunctivae and EOM are normal. Pupils are equal, round, and reactive to light. Right eye exhibits no discharge. Left eye exhibits no discharge. No scleral icterus.  Neck: Normal range of motion. Neck supple. No JVD present.  Cardiovascular: Normal rate, regular rhythm and normal heart sounds.  Exam reveals no gallop and no friction rub.   No murmur heard. Pulmonary/Chest: Effort normal and breath sounds normal. No respiratory distress. He has no wheezes. He has no rales. He exhibits no tenderness.  Abdominal: Soft. He exhibits no distension and no mass. There is no tenderness. There is no rebound and no guarding.  Musculoskeletal: Normal range of motion. He exhibits no edema or tenderness.  Neurological: He is alert and oriented to person, place, and time.  Skin: Skin is warm and dry.  Psychiatric: He has a normal mood and affect. His behavior is normal. Judgment and thought content normal.  Nursing note and vitals reviewed.   ED Course  Procedures (including critical care time) Results for orders placed or performed during the hospital encounter of 12/12/14  CBC with Differential/Platelet  Result Value Ref Range   WBC 8.1 4.0 - 10.5 K/uL   RBC 5.48 4.22 - 5.81 MIL/uL   Hemoglobin 14.8 13.0 - 17.0 g/dL   HCT 40.944.3 81.139.0 - 91.452.0 %   MCV 80.8 78.0 - 100.0 fL   MCH 27.0 26.0 - 34.0 pg   MCHC 33.4 30.0 - 36.0 g/dL   RDW 78.213.7 95.611.5 - 21.315.5 %   Platelets 199 150 - 400 K/uL   Neutrophils Relative % 59 43 - 77 %   Neutro Abs 4.8 1.7 - 7.7 K/uL   Lymphocytes Relative 23 12 - 46 %   Lymphs Abs 1.9 0.7 - 4.0 K/uL   Monocytes Relative 15 (H) 3 - 12 %   Monocytes Absolute 1.2 (H) 0.1 - 1.0 K/uL   Eosinophils Relative 3 0 - 5 %   Eosinophils Absolute 0.2 0.0 - 0.7 K/uL   Basophils Relative 0 0 - 1 %   Basophils Absolute 0.0 0.0 - 0.1 K/uL  Brain natriuretic peptide  Result Value Ref Range   B Natriuretic Peptide 177.6 (H) 0.0  - 100.0 pg/mL  I-stat chem 8, ed  Result Value Ref Range   Sodium 144 135 - 145 mmol/L   Potassium 4.0 3.5 - 5.1 mmol/L   Chloride 102 96 - 112 mmol/L   BUN 21 6 - 23 mg/dL   Creatinine, Ser 0.861.20 0.50 - 1.35 mg/dL   Glucose, Bld 93 70 - 99 mg/dL   Calcium, Ion 5.781.22 4.691.13 - 1.30 mmol/L   TCO2 26 0 - 100 mmol/L   Hemoglobin 16.3 13.0 - 17.0 g/dL   HCT 62.948.0 52.839.0 - 41.352.0 %  I-Stat Troponin, ED (not at [facility])  Result Value Ref Range   Troponin i, poc 0.06  0.00 - 0.08 ng/mL   Comment 3           Dg Chest 2 View  12/12/2014   CLINICAL DATA:  Hypertension and altered mental status.  EXAM: CHEST  2 VIEW  COMPARISON:  02/28/2013 and prior chest radiographs dating back to 10/31/2006  FINDINGS: Mild cardiomegaly and CABG changes again noted.  A large hiatal hernia is again identified.  There is no evidence of focal airspace disease, pulmonary edema, suspicious pulmonary nodule/mass, pleural effusion, or pneumothorax. No acute bony abnormalities are identified.  Remote rib fractures are again identified.  IMPRESSION: Cardiomegaly without evidence of acute cardiopulmonary disease.  Large hiatal hernia again noted.   Electronically Signed   By: Harmon Pier M.D.   On: 12/12/2014 22:21      EKG Interpretation   Date/Time:  Friday December 12 2014 19:22:04 EDT Ventricular Rate:  78 PR Interval:  306 QRS Duration: 133 QT Interval:  428 QTC Calculation: 487 R Axis:   -58 Text Interpretation:  Sinus rhythm Multiple premature complexes, vent   Prolonged PR interval Left bundle branch block old LBBB. No change in QRS  morphology. Confirmed by Donnald Garre, MD, Lebron Conners [phone]) on 12/12/2014 9:29:47  PM      MDM   Final diagnoses:  SOB (shortness of breath)  Essential hypertension    Patient discussed with Dr. Donnald Garre, will treat with lisinopril and catepres and reassess.  Asymptomatic now, but BP still very high.  Also, had some SOB over the last couple of days.  Will check basic labs and reassess.  11:14  PM Informed that patient had not been given his medications.   Filed Vitals:   12/13/14 0022  BP: 146/76  Pulse: 71  Temp:   Resp: 15   Patient reassessed. Feeling well. Discussed with Dr. Donnald Garre, who agrees with plan for discharge. Will add lisinopril. Recommend primary care follow-up.    Roxy Horseman, PA-C 12/13/14 0031  Arby Barrette, MD 12/13/14 2155

## 2014-12-13 MED ORDER — LISINOPRIL 10 MG PO TABS: 10.0000 mg | ORAL_TABLET | Freq: Every day | ORAL | Status: DC

## 2014-12-13 NOTE — Discharge Instructions (Signed)

## 2014-12-15 ENCOUNTER — Encounter (HOSPITAL_COMMUNITY): Payer: Self-pay | Admitting: Emergency Medicine

## 2014-12-15 ENCOUNTER — Emergency Department (HOSPITAL_COMMUNITY)
Admission: EM | Admit: 2014-12-15 | Discharge: 2014-12-15 | Disposition: A | Discharge status: Home / Self Care | Payer: Medicare Other | Attending: Emergency Medicine | Admitting: Emergency Medicine

## 2014-12-15 DIAGNOSIS — Z8719 Personal history of other diseases of the digestive system: Secondary | ICD-10-CM | POA: Diagnosis not present

## 2014-12-15 DIAGNOSIS — Z79899 Other long term (current) drug therapy: Secondary | ICD-10-CM | POA: Diagnosis not present

## 2014-12-15 DIAGNOSIS — I10 Essential (primary) hypertension: Secondary | ICD-10-CM | POA: Insufficient documentation

## 2014-12-15 DIAGNOSIS — Z87438 Personal history of other diseases of male genital organs: Secondary | ICD-10-CM | POA: Insufficient documentation

## 2014-12-15 DIAGNOSIS — Z8673 Personal history of transient ischemic attack (TIA), and cerebral infarction without residual deficits: Secondary | ICD-10-CM | POA: Diagnosis not present

## 2014-12-15 DIAGNOSIS — Z7982 Long term (current) use of aspirin: Secondary | ICD-10-CM | POA: Diagnosis not present

## 2014-12-15 DIAGNOSIS — I2510 Atherosclerotic heart disease of native coronary artery without angina pectoris: Secondary | ICD-10-CM | POA: Diagnosis not present

## 2014-12-15 NOTE — ED Notes (Signed)
The patient says he takes blood pressure medication and he took it today but his blood pressure is still high.  He denies any other symptoms other than the blood pressure being high.  [facility] and they discharged him with medication to take.  He has been keeping an eye on it and it was high today.

## 2014-12-15 NOTE — Discharge Instructions (Signed)

## 2014-12-15 NOTE — ED Notes (Signed)
Call brother, Lamar Laundrydwin [place] with any questions or concerns: [phone].

## 2014-12-15 NOTE — ED Provider Notes (Signed)
CSN: [id number]     Arrival date & time 12/15/14  1910 History   First MD Initiated Contact with Patient 12/15/14 2224     Chief Complaint  Patient presents with  . Hypertension    The patient says he takes blood pressure medication and he took it today but his blood pressure is still high.  He denies any other symptoms other than the blood pressure being high.     Patient is a 69 y.o. male presenting with hypertension. The history is provided by the patient.  Hypertension This is a recurrent problem. The problem occurs constantly. The problem has not changed since onset.Pertinent negatives include no chest pain, no abdominal pain, no headaches and no shortness of breath. Nothing aggravates the symptoms. Nothing relieves the symptoms.  pt reports he noted elevated blood pressure readings at home today He denies cp/sob/weakness/HA He denies any other complaints He reports med compliance  Past Medical History  Diagnosis Date  . Hypertension   . BPH (benign prostatic hyperplasia)   . Syncope and collapse 08/07/2012    "w/loss of consciousness; found down; don't know how long he was out" (08/07/2012)  . H/O hiatal hernia   . Seizures     "h/o gran mal & petit" (08/07/2012)  . Stroke 1980's    "mini w/seizure at the same time" (08/07/2012)  . Coronary artery disease   . LBBB (left bundle branch block)    Past Surgical History  Procedure Laterality Date  . Coronary artery bypass graft  ~ 2010    CABG X1  . Tibia fracture surgery  1960's    RLE   Family History  Problem Relation Age of Onset  . Heart disease      No family history   History  Substance Use Topics  . Smoking status: Never Smoker   . Smokeless tobacco: Never Used  . Alcohol Use: No    Review of Systems  Constitutional: Negative for fever.  Respiratory: Negative for shortness of breath.   Cardiovascular: Negative for chest pain.  Gastrointestinal: Negative for abdominal pain.  Neurological: Negative for  weakness and headaches.  All other systems reviewed and are negative.     Allergies  Codeine  Home Medications   Prior to Admission medications   Medication Sig Start Date End Date Taking? Authorizing Provider  alum hydroxide-mag trisilicate (GAVISCON) 80-20 MG CHEW chewable tablet Chew 1 tablet by mouth at bedtime.   Yes Historical Provider, MD  aspirin 81 MG chewable tablet Chew 81 mg by mouth daily.   Yes Historical Provider, MD  carboxymethylcellulose 1 % ophthalmic solution Place 1 drop into both eyes 5 (five) times daily.   Yes Historical Provider, MD  dorzolamide-timolol (COSOPT) 22.3-6.8 MG/ML ophthalmic solution Place 1 drop into both eyes 2 (two) times daily.   Yes Historical Provider, MD  finasteride (PROSCAR) 5 MG tablet Take 5 mg by mouth daily.   Yes Historical Provider, MD  hydrochlorothiazide (HYDRODIURIL) 25 MG tablet Take 25 mg by mouth daily.   Yes Historical Provider, MD  levETIRAcetam (KEPPRA) 750 MG tablet Take 1 tablet (750 mg total) by mouth 2 (two) times daily. 03/04/13  Yes Drema Dallasurtis J Woods, MD  lisinopril (PRINIVIL,ZESTRIL) 10 MG tablet Take 1 tablet (10 mg total) by mouth daily. 12/13/14  Yes Roxy Horsemanobert Browning, PA-C  lovastatin (MEVACOR) 20 MG tablet Take 1 tablet (20 mg total) by mouth at bedtime. 03/04/13  Yes Drema Dallasurtis J Woods, MD  Multiple Vitamin (MULTIVITAMIN WITH MINERALS) TABS Take 1  tablet by mouth daily.   Yes Historical Provider, MD  omeprazole (PRILOSEC) 20 MG capsule Take 20 mg by mouth daily.   Yes Historical Provider, MD  potassium chloride SA (K-DUR,KLOR-CON) 20 MEQ tablet Take 20 mEq by mouth 2 (two) times daily.   Yes Historical Provider, MD  albuterol (PROVENTIL HFA;VENTOLIN HFA) 108 (90 BASE) MCG/ACT inhaler Inhale 2 puffs into the lungs every 6 (six) hours as needed for wheezing.    Historical Provider, MD   BP 173/93 mmHg  Pulse 65  Temp(Src) 97.9 F (36.6 C)  Resp 23  Wt 172 lb 3 oz (78.104 kg)  SpO2 98% Physical Exam CONSTITUTIONAL: Well  developed/well nourished HEAD: Normocephalic/atraumatic EYES: EOMI/PERRL ENMT: Mucous membranes moist NECK: supple no meningeal signs SPINE/BACK:entire spine nontender CV: S1/S2 noted, no murmurs/rubs/gallops noted LUNGS: Lungs are clear to auscultation bilaterally, no apparent distress ABDOMEN: soft, nontender, no rebound or guarding, bowel sounds noted throughout abdomen GU:no cva tenderness NEURO: Pt is awake/alert/appropriate, moves all extremitiesx4.  No arm/leg drift.  He answers all questions appropriately EXTREMITIES: full ROM SKIN: warm, color normal PSYCH: no abnormalities of mood noted, alert and oriented to situation  ED Course  Procedures  Pt seen in [facility] on 4/8 and had extensive workup for his HTN He was started on lisinopril and he is also taking HCTZ Today he returns for elevated BP without symptoms He is well appearing He denies other complaints I advised need for continued med compliance, and needs close PCP f/u (he sees [place]) We discussed strict return precautions but I don't feel emergent intervention required D/w patient and his brother via phone BP 173/93 mmHg  Pulse 65  Temp(Src) 97.9 F (36.6 C) (Oral)  Resp 23  Wt 172 lb 3 oz (78.104 kg)  SpO2 98%  MDM   Final diagnoses:  Essential hypertension    Nursing notes including past medical history and social history reviewed and considered in documentation Previous records reviewed and considered     Zadie Rhine, MD 12/15/14 2313

## 2014-12-16 ENCOUNTER — Encounter (HOSPITAL_COMMUNITY): Payer: Self-pay | Admitting: *Deleted

## 2014-12-16 ENCOUNTER — Emergency Department (HOSPITAL_COMMUNITY)
Admission: EM | Admit: 2014-12-16 | Discharge: 2014-12-16 | Disposition: A | Discharge status: Home / Self Care | Payer: Medicare Other | Attending: Emergency Medicine | Admitting: Emergency Medicine

## 2014-12-16 DIAGNOSIS — Z79899 Other long term (current) drug therapy: Secondary | ICD-10-CM | POA: Insufficient documentation

## 2014-12-16 DIAGNOSIS — I10 Essential (primary) hypertension: Secondary | ICD-10-CM | POA: Insufficient documentation

## 2014-12-16 DIAGNOSIS — Z7982 Long term (current) use of aspirin: Secondary | ICD-10-CM | POA: Diagnosis not present

## 2014-12-16 DIAGNOSIS — Z8673 Personal history of transient ischemic attack (TIA), and cerebral infarction without residual deficits: Secondary | ICD-10-CM | POA: Diagnosis not present

## 2014-12-16 DIAGNOSIS — Z8719 Personal history of other diseases of the digestive system: Secondary | ICD-10-CM | POA: Insufficient documentation

## 2014-12-16 DIAGNOSIS — I2510 Atherosclerotic heart disease of native coronary artery without angina pectoris: Secondary | ICD-10-CM | POA: Insufficient documentation

## 2014-12-16 DIAGNOSIS — Z87438 Personal history of other diseases of male genital organs: Secondary | ICD-10-CM | POA: Diagnosis not present

## 2014-12-16 DIAGNOSIS — G40909 Epilepsy, unspecified, not intractable, without status epilepticus: Secondary | ICD-10-CM | POA: Insufficient documentation

## 2014-12-16 NOTE — Discharge Instructions (Signed)
Please call your doctor for a followup appointment within 24-48 hours. When you talk to your doctor please let them know that you were seen in the emergency department and have them acquire all of your records so that they can discuss the findings with you and formulate a treatment plan to fully care for your new and ongoing problems. ° °

## 2014-12-16 NOTE — ED Provider Notes (Signed)
CSN: [id number]     Arrival date & time 12/16/14  [id number] History  This chart was scribed for Eber HongBrian Kiwanna Spraker, MD by Annye AsaAnna Dorsett, ED Scribe. This patient was seen in room A07C/A07C and the patient's care was started at 3:57 AM.    No chief complaint on file.  Patient is a 69 y.o. male presenting with hypertension. The history is provided by the patient and medical records. No language interpreter was used.  Hypertension     HPI Comments: [name] is a 69 y.o. male who presents to the Emergency Department for asymptomatic hypertension. Patient explains his BP has oscillated throughout the day; one measurement in particular concerned him, reading 189/97. Per patient's measurements, most of the readings show systolics between 130 - 150, with several outliers in the 180s. He denies pain, headaches, nausea, weakness, numbness. He reports he is compliant with all medications.   Patient was seen last night by Dr. Bebe ShaggyWickline for the same issue.   Past Medical History  Diagnosis Date  . Hypertension   . BPH (benign prostatic hyperplasia)   . Syncope and collapse 08/07/2012    "w/loss of consciousness; found down; don't know how long he was out" (08/07/2012)  . H/O hiatal hernia   . Seizures     "h/o gran mal & petit" (08/07/2012)  . Stroke 1980's    "mini w/seizure at the same time" (08/07/2012)  . Coronary artery disease   . LBBB (left bundle branch block)    Past Surgical History  Procedure Laterality Date  . Coronary artery bypass graft  ~ 2010    CABG X1  . Tibia fracture surgery  1960's    RLE   Family History  Problem Relation Age of Onset  . Heart disease      No family history   History  Substance Use Topics  . Smoking status: Never Smoker   . Smokeless tobacco: Never Used  . Alcohol Use: No    Review of Systems  All other systems reviewed and are negative.     Allergies  Codeine  Home Medications   Prior to Admission medications   Medication Sig Start Date End  Date Taking? Authorizing Provider  albuterol (PROVENTIL HFA;VENTOLIN HFA) 108 (90 BASE) MCG/ACT inhaler Inhale 2 puffs into the lungs every 6 (six) hours as needed for wheezing.    Historical Provider, MD  alum hydroxide-mag trisilicate (GAVISCON) 80-20 MG CHEW chewable tablet Chew 1 tablet by mouth at bedtime.    Historical Provider, MD  aspirin 81 MG chewable tablet Chew 81 mg by mouth daily.    Historical Provider, MD  carboxymethylcellulose 1 % ophthalmic solution Place 1 drop into both eyes 5 (five) times daily.    Historical Provider, MD  dorzolamide-timolol (COSOPT) 22.3-6.8 MG/ML ophthalmic solution Place 1 drop into both eyes 2 (two) times daily.    Historical Provider, MD  finasteride (PROSCAR) 5 MG tablet Take 5 mg by mouth daily.    Historical Provider, MD  hydrochlorothiazide (HYDRODIURIL) 25 MG tablet Take 25 mg by mouth daily.    Historical Provider, MD  levETIRAcetam (KEPPRA) 750 MG tablet Take 1 tablet (750 mg total) by mouth 2 (two) times daily. 03/04/13   Drema Dallasurtis J Woods, MD  lisinopril (PRINIVIL,ZESTRIL) 10 MG tablet Take 1 tablet (10 mg total) by mouth daily. 12/13/14   Roxy Horsemanobert Browning, PA-C  lovastatin (MEVACOR) 20 MG tablet Take 1 tablet (20 mg total) by mouth at bedtime. 03/04/13   Drema Dallasurtis J Woods, MD  Multiple Vitamin (MULTIVITAMIN WITH MINERALS) TABS Take 1 tablet by mouth daily.    Historical Provider, MD  omeprazole (PRILOSEC) 20 MG capsule Take 20 mg by mouth daily.    Historical Provider, MD  potassium chloride SA (K-DUR,KLOR-CON) 20 MEQ tablet Take 20 mEq by mouth 2 (two) times daily.    Historical Provider, MD   There were no vitals taken for this visit. Physical Exam  Constitutional: He appears well-developed and well-nourished. No distress.  HENT:  Head: Normocephalic and atraumatic.  Mouth/Throat: Oropharynx is clear and moist. No oropharyngeal exudate.  Eyes: Conjunctivae and EOM are normal. Pupils are equal, round, and reactive to light. Right eye exhibits no  discharge. Left eye exhibits no discharge. No scleral icterus.  Neck: Normal range of motion. Neck supple. No JVD present. No thyromegaly present.  Cardiovascular: Normal rate, regular rhythm, normal heart sounds and intact distal pulses.  Exam reveals no gallop and no friction rub.   No murmur heard. Normal rate and rhythm, no murmurs  Pulmonary/Chest: Effort normal and breath sounds normal. No respiratory distress. He has no wheezes. He has no rales.  Normal lung sounds, no distress  Musculoskeletal: Normal range of motion. He exhibits no edema or tenderness.  Lymphadenopathy:    He has no cervical adenopathy.  Neurological: He is alert. Coordination normal.  Normal speech, gait, coordination, normal cranial nerves III through XII  Skin: Skin is warm and dry. No rash noted. No erythema.  Psychiatric: He has a normal mood and affect. His behavior is normal.  Nursing note and vitals reviewed.   ED Course  Procedures   DIAGNOSTIC STUDIES: Oxygen Saturation is 98% on RA, normal by my interpretation.    COORDINATION OF CARE: 4:03 AM Discussed treatment plan with pt at bedside and pt agreed to plan.   Labs Review Labs Reviewed - No data to display  Imaging Review No results found.    MDM   Final diagnoses:  None    The patient has already been seen this evening for the same complaint, his blood pressure is totally normal at this time, he recently underwent extensive testing in the emergency department, he has no complaints, this is asymptomatic hypertension and in fact is not even hypertensive at this time. He is stable for discharge, he has been given reassurance, no medication changes made.  I personally performed the services described in this documentation, which was scribed in my presence. The recorded information has been reviewed and is accurate.        Eber Hong, MD 12/16/14 [phone]

## 2014-12-16 NOTE — ED Notes (Signed)
Pt states that his BP has been up and he has been taking his meds like he is suppose to.

## 2014-12-18 ENCOUNTER — Encounter (HOSPITAL_COMMUNITY): Payer: Self-pay | Admitting: *Deleted

## 2014-12-18 ENCOUNTER — Emergency Department (HOSPITAL_COMMUNITY)
Admission: EM | Admit: 2014-12-18 | Discharge: 2014-12-18 | Disposition: A | Discharge status: Home / Self Care | Payer: Medicare Other | Attending: Emergency Medicine | Admitting: Emergency Medicine

## 2014-12-18 DIAGNOSIS — Z951 Presence of aortocoronary bypass graft: Secondary | ICD-10-CM | POA: Diagnosis not present

## 2014-12-18 DIAGNOSIS — Z79899 Other long term (current) drug therapy: Secondary | ICD-10-CM | POA: Insufficient documentation

## 2014-12-18 DIAGNOSIS — Z7982 Long term (current) use of aspirin: Secondary | ICD-10-CM | POA: Insufficient documentation

## 2014-12-18 DIAGNOSIS — Z8719 Personal history of other diseases of the digestive system: Secondary | ICD-10-CM | POA: Insufficient documentation

## 2014-12-18 DIAGNOSIS — Z8673 Personal history of transient ischemic attack (TIA), and cerebral infarction without residual deficits: Secondary | ICD-10-CM | POA: Diagnosis not present

## 2014-12-18 DIAGNOSIS — Z87438 Personal history of other diseases of male genital organs: Secondary | ICD-10-CM | POA: Diagnosis not present

## 2014-12-18 DIAGNOSIS — I2510 Atherosclerotic heart disease of native coronary artery without angina pectoris: Secondary | ICD-10-CM | POA: Diagnosis not present

## 2014-12-18 DIAGNOSIS — I10 Essential (primary) hypertension: Secondary | ICD-10-CM | POA: Diagnosis not present

## 2014-12-18 DIAGNOSIS — G40909 Epilepsy, unspecified, not intractable, without status epilepticus: Secondary | ICD-10-CM | POA: Insufficient documentation

## 2014-12-18 NOTE — ED Provider Notes (Signed)
The patient presents back to the hospital after finding his blood pressure to be elevated at home again. He has been seen approximately 4 times in the last several days.  He has no other complaints other than a high blood pressure. He states that [facility], he was told he did not need to change his medications. When I saw him several days ago I told him the same thing when his blood pressure was essentially normal on arrival. The patient is known to take his blood pressure multiple times a day and has several high readings, most of them are normal. He has no other complaints, no physical complaints, no somatic complaints, no shortness of breath, on exam he has clear lungs, clear heart sounds, normal neurologic exam with coordination speech and strength, soft abdomen which is nontender and no rashes. The patient does not need any new medications, he does not need any changes in his dosing, he is stable for discharge.  I saw and evaluated the patient, reviewed the resident's note and I agree with the findings and plan.   EKG Interpretation  Date/Time:  Thursday December 18 2014 19:50:40 EDT Ventricular Rate:  71 PR Interval:  306 QRS Duration: 148 QT Interval:  412 QTC Calculation: 447 R Axis:   -28 Text Interpretation:  Sinus rhythm with 1st degree A-V block with Premature supraventricular complexes and with occasional Premature ventricular complexes Left bundle branch block Abnormal ECG since last tracing no significant change Confirmed by Mariaguadalupe Fialkowski  MD, Trinity Hyland ([id number]) on 12/18/2014 10:24:42 PM      I personally interpreted the EKG as well as the resident and agree with the interpretation on the resident's chart.  Final diagnoses:  Essential hypertension      Eber HongBrian Shabree Tebbetts, MD 12/19/14 [phone]

## 2014-12-18 NOTE — ED Notes (Signed)
Pt wants to have his blood pressure evaluated. Pt reports SBP 190. Pt is asymptomatic, denies pain.

## 2014-12-18 NOTE — ED Notes (Signed)
Brother, Dorma Russelldwin phone number [phone]

## 2014-12-18 NOTE — Discharge Instructions (Signed)

## 2014-12-18 NOTE — ED Provider Notes (Signed)
CSN: [id number]     Arrival date & time 12/18/14  1929 History   First MD Initiated Contact with Patient 12/18/14 2140     Chief Complaint  Patient presents with  . Hypertension     (Consider location/radiation/quality/duration/timing/severity/associated sxs/prior Treatment) HPI   This is a 69 yo male with PMH HTN,presenting today with asymptomatic hypertension.  This started weeks ago and he has been to this department as well as the [facility] multiple times over the last week for this.  This happens at home.  He checks his BP every half hour or so.  It is normal most of the time, but occasionally it is abnormal.  He has not started new medications.  He denies HA, weakness, numbness, tingling, trouble speaking, trouble walking, CP, SOB, abdominal pain, nausea, vomiting, diaphoresis, or change in vision.    Past Medical History  Diagnosis Date  . Hypertension   . BPH (benign prostatic hyperplasia)   . Syncope and collapse 08/07/2012    "w/loss of consciousness; found down; don't know how long he was out" (08/07/2012)  . H/O hiatal hernia   . Seizures     "h/o gran mal & petit" (08/07/2012)  . Stroke 1980's    "mini w/seizure at the same time" (08/07/2012)  . Coronary artery disease   . LBBB (left bundle branch block)    Past Surgical History  Procedure Laterality Date  . Coronary artery bypass graft  ~ 2010    CABG X1  . Tibia fracture surgery  1960's    RLE   Family History  Problem Relation Age of Onset  . Heart disease      No family history   History  Substance Use Topics  . Smoking status: Never Smoker   . Smokeless tobacco: Never Used  . Alcohol Use: No    Review of Systems  Constitutional: Negative for fever and chills.  HENT: Negative for facial swelling.   Eyes: Negative for pain and visual disturbance.  Respiratory: Negative for chest tightness and shortness of breath.   Cardiovascular: Negative for chest pain.  Gastrointestinal: Negative for nausea and  vomiting.  Genitourinary: Negative for dysuria.  Musculoskeletal: Negative for myalgias and arthralgias.  Neurological: Negative for headaches.  Psychiatric/Behavioral: Negative for behavioral problems.      Allergies  Codeine  Home Medications   Prior to Admission medications   Medication Sig Start Date End Date Taking? Authorizing Provider  albuterol (PROVENTIL HFA;VENTOLIN HFA) 108 (90 BASE) MCG/ACT inhaler Inhale 2 puffs into the lungs every 6 (six) hours as needed for wheezing.   Yes Historical Provider, MD  aspirin 81 MG chewable tablet Chew 81 mg by mouth daily.   Yes Historical Provider, MD  carboxymethylcellulose 1 % ophthalmic solution Place 1 drop into both eyes 5 (five) times daily.   Yes Historical Provider, MD  dorzolamide-timolol (COSOPT) 22.3-6.8 MG/ML ophthalmic solution Place 1 drop into both eyes 2 (two) times daily.   Yes Historical Provider, MD  finasteride (PROSCAR) 5 MG tablet Take 5 mg by mouth daily.   Yes Historical Provider, MD  hydrochlorothiazide (HYDRODIURIL) 25 MG tablet Take 25 mg by mouth daily.   Yes Historical Provider, MD  levETIRAcetam (KEPPRA) 750 MG tablet Take 1 tablet (750 mg total) by mouth 2 (two) times daily. 03/04/13  Yes Drema Dallas, MD  lisinopril (PRINIVIL,ZESTRIL) 10 MG tablet Take 1 tablet (10 mg total) by mouth daily. 12/13/14  Yes Roxy Horseman, PA-C  lovastatin (MEVACOR) 20 MG tablet Take  1 tablet (20 mg total) by mouth at bedtime. 03/04/13  Yes Drema Dallasurtis J Woods, MD  Multiple Vitamin (MULTIVITAMIN WITH MINERALS) TABS Take 1 tablet by mouth daily.   Yes Historical Provider, MD  omeprazole (PRILOSEC) 20 MG capsule Take 20 mg by mouth daily.   Yes Historical Provider, MD  potassium chloride SA (K-DUR,KLOR-CON) 20 MEQ tablet Take 20 mEq by mouth 2 (two) times daily.   Yes Historical Provider, MD  alum hydroxide-mag trisilicate (GAVISCON) 80-20 MG CHEW chewable tablet Chew 1 tablet by mouth at bedtime.    Historical Provider, MD   BP 150/89  mmHg  Pulse 62  Temp(Src) 98.6 F (37 C) (Oral)  Resp 14  Ht 5\' 6"  (1.676 m)  Wt 171 lb (77.565 kg)  BMI 27.61 kg/m2  SpO2 98% Physical Exam  Constitutional: He is oriented to person, place, and time. He appears well-developed and well-nourished. No distress.  HENT:  Head: Normocephalic and atraumatic.  Mouth/Throat: No oropharyngeal exudate.  Eyes: Conjunctivae are normal. Pupils are equal, round, and reactive to light. No scleral icterus.  Fundoscopic exam:      The right eye shows no arteriolar narrowing and no AV nicking.       The left eye shows no arteriolar narrowing and no AV nicking.  Neck: Normal range of motion. No tracheal deviation present. No thyromegaly present.  Cardiovascular: Normal rate, regular rhythm and normal heart sounds.  Exam reveals no gallop and no friction rub.   No murmur heard. Pulmonary/Chest: Effort normal and breath sounds normal. No stridor. No respiratory distress. He has no wheezes. He has no rales. He exhibits no tenderness.  Abdominal: Soft. He exhibits no distension and no mass. There is no tenderness. There is no rebound and no guarding.  Musculoskeletal: Normal range of motion. He exhibits no edema.  Neurological: He is alert and oriented to person, place, and time.  Skin: Skin is warm and dry. He is not diaphoretic.    ED Course  Procedures (including critical care time)  MDM   Final diagnoses:  Essential hypertension    This is a 69 yo male with PMH HTN,presenting today with asymptomatic hypertension.  This started weeks ago and he has been to this department as well as [facility] times over the last week for this.  This happens at home.  He checks his BP every half hour or so.  It is normal most of the time, but occasionally it is abnormal.  He has not started new medications.  He denies HA, weakness, numbness, tingling, trouble speaking, trouble walking, CP, SOB, abdominal pain, nausea, vomiting, diaphoresis, or change in  vision.    He states his SBP was around 200 earlier today, but at this time his hypertension has resolved. BP is 150/89.  Mr. Zeb ComfortGambill has no signs or symptoms that would reflect end organ damage.  CV, respiratory, fundoscopic exam is WNL.  There is no further workup or eval indicated at this time.  Pt stable for discharge, FU.  All questions answered.  Return precautions given.    I have discussed case and care has been guided by my attending physician, Dr. Hyacinth MeekerMiller.  Loma BostonStirling Wyolene Weimann, MD 12/19/14 0028  Eber HongBrian Miller, MD 12/19/14 [phone]

## 2015-02-10 ENCOUNTER — Emergency Department (HOSPITAL_COMMUNITY)
Admission: EM | Admit: 2015-02-10 | Discharge: 2015-02-10 | Discharge status: Absent without leave | Payer: Medicare Other

## 2015-10-31 ENCOUNTER — Emergency Department (HOSPITAL_COMMUNITY)
Admission: EM | Admit: 2015-10-31 | Discharge: 2015-11-03 | Disposition: A | Discharge status: Other Acute Inpatient Hospital | Payer: Medicare Other | Attending: Emergency Medicine | Admitting: Emergency Medicine

## 2015-10-31 ENCOUNTER — Emergency Department (HOSPITAL_COMMUNITY): Payer: Medicare Other

## 2015-10-31 ENCOUNTER — Encounter (HOSPITAL_COMMUNITY): Payer: Self-pay | Admitting: Emergency Medicine

## 2015-10-31 DIAGNOSIS — F911 Conduct disorder, childhood-onset type: Secondary | ICD-10-CM | POA: Insufficient documentation

## 2015-10-31 DIAGNOSIS — R4589 Other symptoms and signs involving emotional state: Secondary | ICD-10-CM

## 2015-10-31 DIAGNOSIS — R45851 Suicidal ideations: Secondary | ICD-10-CM | POA: Insufficient documentation

## 2015-10-31 DIAGNOSIS — F0391 Unspecified dementia with behavioral disturbance: Secondary | ICD-10-CM | POA: Diagnosis not present

## 2015-10-31 DIAGNOSIS — F322 Major depressive disorder, single episode, severe without psychotic features: Secondary | ICD-10-CM | POA: Insufficient documentation

## 2015-10-31 DIAGNOSIS — N400 Enlarged prostate without lower urinary tract symptoms: Secondary | ICD-10-CM | POA: Diagnosis not present

## 2015-10-31 DIAGNOSIS — F03918 Unspecified dementia, unspecified severity, with other behavioral disturbance: Secondary | ICD-10-CM | POA: Diagnosis present

## 2015-10-31 DIAGNOSIS — Z7982 Long term (current) use of aspirin: Secondary | ICD-10-CM | POA: Insufficient documentation

## 2015-10-31 DIAGNOSIS — Z8719 Personal history of other diseases of the digestive system: Secondary | ICD-10-CM | POA: Diagnosis not present

## 2015-10-31 DIAGNOSIS — Z951 Presence of aortocoronary bypass graft: Secondary | ICD-10-CM | POA: Diagnosis not present

## 2015-10-31 DIAGNOSIS — I10 Essential (primary) hypertension: Secondary | ICD-10-CM | POA: Insufficient documentation

## 2015-10-31 DIAGNOSIS — Z046 Encounter for general psychiatric examination, requested by authority: Secondary | ICD-10-CM | POA: Diagnosis present

## 2015-10-31 DIAGNOSIS — Z8673 Personal history of transient ischemic attack (TIA), and cerebral infarction without residual deficits: Secondary | ICD-10-CM | POA: Diagnosis not present

## 2015-10-31 DIAGNOSIS — I2510 Atherosclerotic heart disease of native coronary artery without angina pectoris: Secondary | ICD-10-CM | POA: Insufficient documentation

## 2015-10-31 DIAGNOSIS — R4689 Other symptoms and signs involving appearance and behavior: Secondary | ICD-10-CM

## 2015-10-31 DIAGNOSIS — Z79899 Other long term (current) drug therapy: Secondary | ICD-10-CM | POA: Diagnosis not present

## 2015-10-31 LAB — I-STAT TROPONIN, ED: Troponin i, poc: 0.05 ng/mL (ref 0.00–0.08)

## 2015-10-31 LAB — COMPREHENSIVE METABOLIC PANEL
ALT: 17 U/L (ref 17–63)
AST: 24 U/L (ref 15–41)
Albumin: 4.5 g/dL (ref 3.5–5.0)
Alkaline Phosphatase: 68 U/L (ref 38–126)
Anion gap: 10 (ref 5–15)
BUN: 27 mg/dL — AB (ref 6–20)
CHLORIDE: 107 mmol/L (ref 101–111)
CO2: 23 mmol/L (ref 22–32)
CREATININE: 1.61 mg/dL — AB (ref 0.61–1.24)
Calcium: 9.8 mg/dL (ref 8.9–10.3)
GFR calc Af Amer: 48 mL/min — ABNORMAL LOW (ref >60)
GFR calc non Af Amer: 41 mL/min — ABNORMAL LOW (ref >60)
GLUCOSE: 94 mg/dL (ref 65–99)
Potassium: 4.2 mmol/L (ref 3.5–5.1)
SODIUM: 140 mmol/L (ref 135–145)
TOTAL PROTEIN: 8.4 g/dL — AB (ref 6.5–8.1)
Total Bilirubin: 0.8 mg/dL (ref 0.3–1.2)

## 2015-10-31 LAB — URINALYSIS, ROUTINE W REFLEX MICROSCOPIC
BILIRUBIN URINE: NEGATIVE
Glucose, UA: NEGATIVE mg/dL
HGB URINE DIPSTICK: NEGATIVE
Ketones, ur: NEGATIVE mg/dL
Leukocytes, UA: NEGATIVE
Nitrite: NEGATIVE
PH: 5.5 (ref 5.0–8.0)
Protein, ur: NEGATIVE mg/dL
SPECIFIC GRAVITY, URINE: 1.011 (ref 1.005–1.030)

## 2015-10-31 LAB — CBC WITH DIFFERENTIAL/PLATELET
BASOS ABS: 0 10*3/uL (ref 0.0–0.1)
BASOS PCT: 0 %
EOS ABS: 0.1 10*3/uL (ref 0.0–0.7)
EOS PCT: 2 %
HCT: 40.1 % (ref 39.0–52.0)
Hemoglobin: 13.6 g/dL (ref 13.0–17.0)
Lymphocytes Relative: 18 %
Lymphs Abs: 1.2 10*3/uL (ref 0.7–4.0)
MCH: 27.8 pg (ref 26.0–34.0)
MCHC: 33.9 g/dL (ref 30.0–36.0)
MCV: 82 fL (ref 78.0–100.0)
Monocytes Absolute: 0.6 10*3/uL (ref 0.1–1.0)
Monocytes Relative: 8 %
Neutro Abs: 5.1 10*3/uL (ref 1.7–7.7)
Neutrophils Relative %: 72 %
PLATELETS: 191 10*3/uL (ref 150–400)
RBC: 4.89 MIL/uL (ref 4.22–5.81)
RDW: 13.4 % (ref 11.5–15.5)
WBC: 7 10*3/uL (ref 4.0–10.5)

## 2015-10-31 LAB — RAPID URINE DRUG SCREEN, HOSP PERFORMED
Amphetamines: NOT DETECTED
BARBITURATES: NOT DETECTED
BENZODIAZEPINES: NOT DETECTED
COCAINE: NOT DETECTED
Opiates: NOT DETECTED
TETRAHYDROCANNABINOL: NOT DETECTED

## 2015-10-31 LAB — ACETAMINOPHEN LEVEL: Acetaminophen (Tylenol), Serum: <10 ug/mL — ABNORMAL LOW (ref 10–30)

## 2015-10-31 LAB — ETHANOL: Alcohol, Ethyl (B): <5 mg/dL (ref <5)

## 2015-10-31 MED ORDER — SODIUM CHLORIDE 0.9 % IV BOLUS (SEPSIS)
500.0000 mL | Freq: Once | INTRAVENOUS | Status: AC
  Administered 2015-10-31: 500 mL via INTRAVENOUS

## 2015-10-31 MED ORDER — POTASSIUM CHLORIDE CRYS ER 20 MEQ PO TBCR
20.0000 meq | EXTENDED_RELEASE_TABLET | Freq: Every day | ORAL | Status: DC
  Administered 2015-11-01 – 2015-11-03 (×3): 20 meq via ORAL
  Filled 2015-10-31 (×4): qty 1

## 2015-10-31 MED ORDER — ALBUTEROL SULFATE HFA 108 (90 BASE) MCG/ACT IN AERS: 2.0000 | INHALATION_SPRAY | Freq: Four times a day (QID) | RESPIRATORY_TRACT | Status: DC | PRN

## 2015-10-31 MED ORDER — DORZOLAMIDE HCL-TIMOLOL MAL 2-0.5 % OP SOLN
1.0000 [drp] | Freq: Two times a day (BID) | OPHTHALMIC | Status: DC
  Administered 2015-11-01 – 2015-11-03 (×5): 1 [drp] via OPHTHALMIC
  Filled 2015-10-31 (×3): qty 10

## 2015-10-31 MED ORDER — CARBOXYMETHYLCELLULOSE SODIUM 1 % OP SOLN: 1.0000 [drp] | Freq: Every day | OPHTHALMIC | Status: DC

## 2015-10-31 MED ORDER — LEVETIRACETAM 750 MG PO TABS
1500.0000 mg | ORAL_TABLET | Freq: Every day | ORAL | Status: DC
  Administered 2015-11-01 – 2015-11-03 (×3): 1500 mg via ORAL
  Filled 2015-10-31 (×3): qty 2

## 2015-10-31 MED ORDER — POLYVINYL ALCOHOL 1.4 % OP SOLN
1.0000 [drp] | Freq: Every day | OPHTHALMIC | Status: DC
  Administered 2015-11-02 – 2015-11-03 (×2): 1 [drp] via OPHTHALMIC
  Filled 2015-10-31 (×2): qty 15

## 2015-10-31 MED ORDER — ATORVASTATIN CALCIUM 10 MG PO TABS
10.0000 mg | ORAL_TABLET | Freq: Every day | ORAL | Status: DC
  Administered 2015-10-31 – 2015-11-03 (×4): 10 mg via ORAL
  Filled 2015-10-31 (×4): qty 1

## 2015-10-31 MED ORDER — ASPIRIN 81 MG PO CHEW
81.0000 mg | CHEWABLE_TABLET | Freq: Every day | ORAL | Status: DC
  Administered 2015-11-01 – 2015-11-03 (×3): 81 mg via ORAL
  Filled 2015-10-31 (×4): qty 1

## 2015-10-31 MED ORDER — LISINOPRIL 5 MG PO TABS
5.0000 mg | ORAL_TABLET | Freq: Two times a day (BID) | ORAL | Status: DC
  Administered 2015-10-31 – 2015-11-03 (×5): 5 mg via ORAL
  Filled 2015-10-31 (×7): qty 1

## 2015-10-31 MED ORDER — ADULT MULTIVITAMIN W/MINERALS CH
1.0000 | ORAL_TABLET | Freq: Every day | ORAL | Status: DC
  Administered 2015-10-31 – 2015-11-03 (×4): 1 via ORAL
  Filled 2015-10-31 (×4): qty 1

## 2015-10-31 MED ORDER — ZIPRASIDONE MESYLATE 20 MG IM SOLR
10.0000 mg | Freq: Once | INTRAMUSCULAR | Status: AC
  Administered 2015-10-31: 10 mg via INTRAMUSCULAR
  Filled 2015-10-31: qty 20

## 2015-10-31 MED ORDER — PANTOPRAZOLE SODIUM 40 MG PO TBEC
40.0000 mg | DELAYED_RELEASE_TABLET | Freq: Every day | ORAL | Status: DC
  Administered 2015-10-31 – 2015-11-03 (×4): 40 mg via ORAL
  Filled 2015-10-31 (×4): qty 1

## 2015-10-31 MED ORDER — STERILE WATER FOR INJECTION IJ SOLN
INTRAMUSCULAR | Status: AC
  Administered 2015-10-31: 10 mL
  Filled 2015-10-31: qty 10

## 2015-10-31 MED ORDER — LEVETIRACETAM 750 MG PO TABS: 1500.0000 mg | ORAL_TABLET | Freq: Two times a day (BID) | ORAL | Status: DC

## 2015-10-31 MED ORDER — ALUM HYDROXIDE-MAG TRISILICATE 80-20 MG PO CHEW
1.0000 | CHEWABLE_TABLET | Freq: Every day | ORAL | Status: DC
  Administered 2015-10-31 – 2015-11-02 (×4): 1 via ORAL
  Filled 2015-10-31 (×5): qty 1

## 2015-10-31 MED ORDER — LEVETIRACETAM 750 MG PO TABS
2250.0000 mg | ORAL_TABLET | Freq: Every day | ORAL | Status: DC
  Administered 2015-10-31 – 2015-11-02 (×3): 2250 mg via ORAL
  Filled 2015-10-31 (×6): qty 3

## 2015-10-31 MED ORDER — FINASTERIDE 5 MG PO TABS
5.0000 mg | ORAL_TABLET | Freq: Every day | ORAL | Status: DC
  Administered 2015-10-31 – 2015-11-03 (×4): 5 mg via ORAL
  Filled 2015-10-31 (×4): qty 1

## 2015-10-31 MED ORDER — HYDROCHLOROTHIAZIDE 25 MG PO TABS
25.0000 mg | ORAL_TABLET | Freq: Every day | ORAL | Status: DC
  Administered 2015-10-31 – 2015-11-03 (×4): 25 mg via ORAL
  Filled 2015-10-31 (×4): qty 1

## 2015-10-31 MED ORDER — LAMOTRIGINE 200 MG PO TABS
200.0000 mg | ORAL_TABLET | Freq: Two times a day (BID) | ORAL | Status: DC
  Administered 2015-10-31 – 2015-11-03 (×6): 200 mg via ORAL
  Filled 2015-10-31 (×7): qty 1

## 2015-10-31 NOTE — Progress Notes (Addendum)
Disposition CSW confirmed with Fayrene Fearing, AOD at the [facility], that the hospital is on Diversion for [employer] and they are not taking referrals this weekend.  Pt is currently registered with [facility] services at the [facility].  CSW also contacted Ray, AOD of [facility], they are also currently full today.  CSW completed patient referrals to the following inpatient Gero-Psych facilities:  [facility]. [facility]  CSW will continue to follow patient for placement needs.  [facility] Disposition CSW [phone]

## 2015-10-31 NOTE — BH Assessment (Signed)
Assessment Note  [name] is an 70 y.o. male. Patient was brought into the ED by [facility] under IVC initiated brother because of suicidal thoughts with plan.  Patient reports having a vivid dream of his daughter that committed suicide last night and when he woke this more he himself wanted to end his life.  He reports he wrote a suicide note to his brother; [name] and [name], I have being thinking about this matter for a long time so I have decided death.  To join my daughter and [name] my (unable to make out his handwritting on some of the this letter) on this matter of self murder. I have ask the Lord forgiveness in this matter. I have decided upon [name] and [name] are grown men and they can take care of themselves you be good thank you.  Haave God Bless you. Thank you Had God Bless you.  [name]  I tried to call Sat. Morning but know one answered 2:25   Brother was at bedside and pt allowed this Clinical research associate to collect collateral.  Brother reports that the patient was dx dementia this year 2017 and has appointment to follow up with neuro at [facility].  He report last week the pt called saying he wanted to end it all but the brother was able to calm him down.  Brother reports their mother had dementia and experienced sundowning therefore he is concerned to leave him brother alone at this point.    This Clinical research associate consulted with Elijah Nottingham, NP it is recommended to refer for gero-psych.    Diagnosis: Major Depressive Disorder, single episode  Past Medical History:  Past Medical History  Diagnosis Date  . Hypertension   . BPH (benign prostatic hyperplasia)   . Syncope and collapse 08/07/2012    "w/loss of consciousness; found down; don't know how long he was out" (08/07/2012)  . H/O hiatal hernia   . Seizures (HCC)     "h/o gran mal & petit" (08/07/2012)  . Stroke [facility]) 1980's    "mini w/seizure at the same time" (08/07/2012)  . Coronary artery disease   . LBBB (left bundle branch block)     Past  Surgical History  Procedure Laterality Date  . Coronary artery bypass graft  ~ 2010    CABG X1  . Tibia fracture surgery  1960's    RLE    Family History:  Family History  Problem Relation Age of Onset  . Heart disease      No family history    Social History:  reports that he has never smoked. He has never used smokeless tobacco. He reports that he does not drink alcohol or use illicit drugs.  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 Codeine Rash    Home Medications:  (Not in a hospital admission)  OB/GYN Status:  No LMP for male patient.  General Assessment Data [facility] of Assessment: [facility] TTS Assessment: In system Is this a Tele or Face-to-Face Assessment?: Face-to-Face Is this an Initial Assessment or a Re-assessment for this encounter?: Initial Assessment Marital status: Widowed Is patient pregnant?: No Pregnancy Status: No Living Arrangements:  Alone Can pt return to current living arrangement?: Yes Admission Status: Involuntary Is patient capable of signing voluntary admission?: No Referral Source: Self/Family/Friend Insurance type: VA/AARP  Medical Screening Exam [facility] Walk-in ONLY) Medical Exam completed: Yes  Crisis Care Plan Living Arrangements: Alone Name of Psychiatrist: none Name of Therapist: none     Risk to self with the past 6 months Suicidal Ideation: Yes-Currently Present Has patient been a risk to self within the past 6 months prior to admission? : Yes Suicidal Intent: Yes-Currently Present Has patient had any suicidal intent within the past 6 months prior to admission? : Yes Is patient at risk for  suicide?: Yes Suicidal Plan?: Yes-Currently Present Has patient had any suicidal plan within the past 6 months prior to admission? : Yes Specify Current Suicidal Plan: shoot self Access to Means: No What has been your use of drugs/alcohol within the last 12 months?: none Previous Attempts/Gestures: No Intentional Self Injurious Behavior: None Family Suicide History: Yes (daughter in 2003) Recent stressful life event(s): Loss (Comment), Conflict (Comment), Other (Comment) (recent dx dementia) Persecutory voices/beliefs?: No Depression: Yes Depression Symptoms: Guilt, Isolating, Fatigue, Loss of interest in usual pleasures, Feeling worthless/self pity, Feeling angry/irritable (hopelessness) Substance abuse history and/or treatment for substance abuse?: No  Risk to Others within the past 6 months Homicidal Ideation: No-Not Currently/Within Last 6 Months Does patient have any lifetime risk of violence toward others beyond the six months prior to admission? : No Thoughts of Harm to Others: No-Not Currently Present/Within Last 6 Months Current Homicidal Intent: No-Not Currently/Within Last 6 Months Current Homicidal Plan: No-Not Currently/Within Last 6 Months Access to Homicidal Means: No Identified Victim: na History of harm to others?: No Assessment of Violence: None Noted Does patient have access to weapons?: No Criminal Charges Pending?: No Does patient have a court date: No Is patient on probation?: No  Psychosis Hallucinations: None noted Delusions: None noted  Mental Status Report Appearance/Hygiene: In hospital gown Eye Contact: Poor Motor Activity: Freedom of movement Speech: Logical/coherent Level of Consciousness: Drowsy Mood: Depressed Affect: Flat Anxiety Level: None Thought Processes: Relevant Judgement: Partial Orientation: Person, Place, Situation Obsessive Compulsive Thoughts/Behaviors: None  Cognitive Functioning Concentration: Fair Memory: Recent Intact,  Remote Intact IQ: Average Insight: Fair Impulse Control: Poor Appetite: Fair Weight Loss: 0 Weight Gain: 0 Sleep: No Change Total Hours of Sleep: 5 Vegetative Symptoms: None  ADLScreening [facility] Assessment Services) Patient's cognitive ability adequate to safely complete daily activities?: Yes Patient able to express need for assistance with ADLs?: Yes Independently performs ADLs?: Yes (appropriate for developmental age)  Prior Inpatient Therapy Prior Inpatient Therapy: No  Prior Outpatient Therapy Prior Outpatient Therapy: No Does patient have an ACCT team?: No Does patient have Intensive In-House Services?  : No Does patient have [facility] services? : No Does patient have P4CC services?: No  ADL Screening (condition at time of admission) Patient's cognitive ability adequate to safely complete daily activities?: Yes Patient able to express need for assistance with ADLs?: Yes Independently performs ADLs?: Yes (appropriate for developmental age)       Abuse/Neglect Assessment (Assessment to be complete while patient is alone) Physical Abuse: Denies Verbal Abuse: Denies Sexual Abuse: Denies Exploitation of patient/patient's resources: Denies Self-Neglect: Denies Values / Beliefs Cultural Requests During Hospitalization: None Spiritual Requests During Hospitalization: None Consults Spiritual Care Consult Needed: No Social Work Consult Needed: Yes (Comment) (Placement long term)      Additional Information 1:1 In Past 12 Months?: No CIRT Risk: No Elopement Risk: No Does  patient have medical clearance?: Yes     Disposition:  Disposition Initial Assessment Completed for this Encounter: Yes Disposition of Patient: Inpatient treatment program Type of inpatient treatment program: Adult [facility])  On Site Evaluation by:   Reviewed with Physician:    Maryelizabeth Rowan A 10/31/2015 3:29 PM

## 2015-10-31 NOTE — ED Notes (Signed)
Pt moved from room 17 to TCU 30 for holding,  Pt placed on hospital bed and two blankets placed on pt for comfort

## 2015-10-31 NOTE — ED Notes (Signed)
Pt placed on hospital bed

## 2015-10-31 NOTE — ED Notes (Signed)
Pt undressed and placed in scrubs and personal belongings placed in bagged and wanded via security. Pt wanded via security.

## 2015-10-31 NOTE — ED Notes (Signed)
Bed: WA17 Expected date:  Expected time:  Means of arrival:  Comments: [employer]- combative elderly

## 2015-10-31 NOTE — ED Notes (Addendum)
TTS and brother at bedside talking to pt. Brother given home medication to take home.
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
## 2015-10-31 NOTE — ED Notes (Signed)
[name], POA at bedside and reported that pt receives medical and psychiatric treatment at the [place] in [place]. His contact number: [phone].
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
## 2015-10-31 NOTE — ED Notes (Signed)
Charge nurse and AC aware of needed sitter  

## 2015-11-01 DIAGNOSIS — F03918 Unspecified dementia, unspecified severity, with other behavioral disturbance: Secondary | ICD-10-CM | POA: Diagnosis present

## 2015-11-01 DIAGNOSIS — F0391 Unspecified dementia with behavioral disturbance: Secondary | ICD-10-CM | POA: Diagnosis not present

## 2015-11-01 DIAGNOSIS — F322 Major depressive disorder, single episode, severe without psychotic features: Secondary | ICD-10-CM

## 2015-11-01 DIAGNOSIS — R45851 Suicidal ideations: Secondary | ICD-10-CM

## 2015-11-01 HISTORY — DX: Unspecified dementia, unspecified severity, with other behavioral disturbance: F03.918

## 2015-11-01 HISTORY — DX: Unspecified dementia with behavioral disturbance: F03.91

## 2015-11-01 MED ORDER — CITALOPRAM HYDROBROMIDE 10 MG PO TABS
10.0000 mg | ORAL_TABLET | Freq: Every day | ORAL | Status: DC
  Administered 2015-11-01 – 2015-11-03 (×3): 10 mg via ORAL
  Filled 2015-11-01 (×4): qty 1

## 2015-11-01 NOTE — ED Notes (Signed)
Pt stated "I wanted to kill myself yesterday because I got to thinking about my daughter killing herself and now I was all alone but now I've been thinking about my 2 grandchildren I have.  I was going to my neighbor's and get his gun and kill myself."

## 2015-11-01 NOTE — Progress Notes (Addendum)
Pt stated, "i do not know why I want to kill myself. I just do. " Pt stated his daughter killed herself 11 years ago. Pt lives alone. He is alert and oriented times three. Pt is concerned about his teeth. He is hopeful that social work can contact his brother. Pt is very cooperative and pleasant. He does contract for safety and pt does have a Comptroller. Pt denies any delusions. Phoned pts brother but the number given was incorrect. (7;45am )Contacted the pts brother at: [phone] who stated he would be here by 9am to see his brother and the doctor. Pt is very pleasant but stated, "I am just depressed and do not know why."Pt told the writer about when he was in [place]. He stated he really enjoyed it and showed the Clinical research associate the tatoo he got there of a woman. He said , "watch she even moves , "as he flexed his muscles in his right arm and laughed. Pt ate 100% of his breakfast. (9am)pt brother came to visit. (9:30am )12:30p Pt did not want to eat lunch.He stated,'it is to early for more food." Pt brother remains at the bedside. 3:30pm Pt is asking if he can be transferred to the [facility],. He wants to go somewhere close so family and church members can visit him. Spoke to EDP as pt felt he may have  Seizure today. Will continue to monitor closely.

## 2015-11-01 NOTE — ED Notes (Signed)
[facility] called and said they would accept the pt tomorrow if his BUN, Creatanine, and BP are WNL. They will recall to check these values in the morning

## 2015-11-01 NOTE — Consult Note (Signed)
[facility]ry Consult   Reason for Consult:  Suicide attempt Referring Physician:  EDP Patient Identification: [name] MRN:  [id number] Principal Diagnosis: Depression, major, single episode, severe ([place]) Diagnosis:   Patient Active Problem List   Diagnosis Date Noted  . Dementia with behavioral disturbance [F03.91] 11/01/2015    Priority: High  . Depression, major, single episode, severe ([place]) [F32.2] 11/01/2015    Priority: High  . Bradycardia [R00.1] 03/01/2013  . UTI (lower urinary tract infection) [N39.0] 09/21/2012  . Encephalopathy [G93.40] 09/18/2012  . Rib fracture [S22.39XA] 09/18/2012  . Seizures with known seizure disorder [R56.9] 09/17/2012  . Lactic acidosis [E87.2] 09/17/2012  . Seizure disorder ([place]) [J69.678] 08/09/2012  . Syncope [R55] 08/07/2012  . Hypokalemia [E87.6] 08/07/2012  . Hypertension [I10]   . LBBB (left bundle branch block) [I44.7]   . BPH (benign prostatic hyperplasia) [N40.0]     Total Time spent with patient: 45 minutes  Subjective:   [name] is a 70 y.o. male patient admitted with suicide attempt.  HPI:  70 yo male who presented after writing a suicide note and attempt to kill himself, history of dementia.  His story is inconsistent despite being Alert and Oriented x 3.  He initially stated he was upset because his only daughter moved to [place] two months ago but later stated she shot herself (suicide), rambling at times.  His POA, his brother, came and clarified.  His daughter died from suicide in December 03, 2001 and he has two grown grandsons, no other support system besides his brother and Company secretary.  Kaylob has been living by himself.  Yesterday, he was upset and called his minister who came over after his brother could not calm him.  They called the police and brought him to the ED.  Depression continues, no homicidal ideations or hallucinations, no alcohol/drug abuse.  Past Psychiatric History: Dementia  Risk to Self:  Suicidal Ideation: Yes-Currently Present Suicidal Intent: Yes-Currently Present Is patient at risk for suicide?: Yes Suicidal Plan?: Yes-Currently Present Specify Current Suicidal Plan: shoot self Access to Means: No What has been your use of drugs/alcohol within the last 12 months?: none Intentional Self Injurious Behavior: None Risk to Others: Homicidal Ideation: No-Not Currently/Within Last 6 Months Thoughts of Harm to Others: No-Not Currently Present/Within Last 6 Months Current Homicidal Intent: No-Not Currently/Within Last 6 Months Current Homicidal Plan: No-Not Currently/Within Last 6 Months Access to Homicidal Means: No Identified Victim: na History of harm to others?: No Assessment of Violence: None Noted Does patient have access to weapons?: No Criminal Charges Pending?: No Does patient have a court date: No Prior Inpatient Therapy: Prior Inpatient Therapy: No Prior Outpatient Therapy: Prior Outpatient Therapy: No Does patient have an ACCT team?: No Does patient have Intensive In-House Services?  : No Does patient have [facility] services? : No Does patient have P4CC services?: No  Past Medical History:  Past Medical History  Diagnosis Date  . Hypertension   . BPH (benign prostatic hyperplasia)   . Syncope and collapse 08/07/2012    "w/loss of consciousness; found down; don't know how long he was out" (08/07/2012)  . H/O hiatal hernia   . Seizures ([place])     "h/o gran mal & petit" (08/07/2012)  . Stroke [facility]) 1980's    "mini w/seizure at the same time" (08/07/2012)  . Coronary artery disease   . LBBB (left bundle branch block)     Past Surgical History  Procedure Laterality Date  . Coronary artery bypass graft  ~  2010    CABG X1  . Tibia fracture surgery  1960's    RLE   Family History:  Family History  Problem Relation Age of Onset  . Heart disease      No family history   Family Psychiatric  History: Depression, daughter Social History:  History  Alcohol  Use No     History  Drug Use No    Social History   Social History  . Marital Status: Legally Separated    Spouse Name: N/A  . Number of Children: N/A  . Years of Education: N/A   Social History Main Topics  . Smoking status: Never Smoker   . Smokeless tobacco: Never Used  . Alcohol Use: No  . Drug Use: No  . Sexual Activity: Not Asked   Other Topics Concern  . None   Social History Narrative   Additional Social History:    Allergies:   Allergies  Allergen Reactions  . Codeine Rash    Labs:  Results for orders placed or performed during the hospital encounter of 10/31/15 (from the past 48 hour(s))  CBC with Differential/Platelet     Status: None   Collection Time: 10/31/15 12:30 PM  Result Value Ref Range   WBC 7.0 4.0 - 10.5 K/uL   RBC 4.89 4.22 - 5.81 MIL/uL   Hemoglobin 13.6 13.0 - 17.0 g/dL   HCT 40.1 39.0 - 52.0 %   MCV 82.0 78.0 - 100.0 fL   MCH 27.8 26.0 - 34.0 pg   MCHC 33.9 30.0 - 36.0 g/dL   RDW 13.4 11.5 - 15.5 %   Platelets 191 150 - 400 K/uL   Neutrophils Relative % 72 %   Neutro Abs 5.1 1.7 - 7.7 K/uL   Lymphocytes Relative 18 %   Lymphs Abs 1.2 0.7 - 4.0 K/uL   Monocytes Relative 8 %   Monocytes Absolute 0.6 0.1 - 1.0 K/uL   Eosinophils Relative 2 %   Eosinophils Absolute 0.1 0.0 - 0.7 K/uL   Basophils Relative 0 %   Basophils Absolute 0.0 0.0 - 0.1 K/uL  Comprehensive metabolic panel     Status: Abnormal   Collection Time: 10/31/15 12:30 PM  Result Value Ref Range   Sodium 140 135 - 145 mmol/L   Potassium 4.2 3.5 - 5.1 mmol/L   Chloride 107 101 - 111 mmol/L   CO2 23 22 - 32 mmol/L   Glucose, Bld 94 65 - 99 mg/dL   BUN 27 (H) 6 - 20 mg/dL   Creatinine, Ser 1.61 (H) 0.61 - 1.24 mg/dL   Calcium 9.8 8.9 - 10.3 mg/dL   Total Protein 8.4 (H) 6.5 - 8.1 g/dL   Albumin 4.5 3.5 - 5.0 g/dL   AST 24 15 - 41 U/L   ALT 17 17 - 63 U/L   Alkaline Phosphatase 68 38 - 126 U/L   Total Bilirubin 0.8 0.3 - 1.2 mg/dL   GFR calc non Af Amer 41 (L)  >60 mL/min   GFR calc Af Amer 48 (L) >60 mL/min    Comment: (NOTE) The eGFR has been calculated using the CKD EPI equation. This calculation has not been validated in all clinical situations. eGFR's persistently <60 mL/min signify possible Chronic Kidney Disease.    Anion gap 10 5 - 15  Ethanol     Status: None   Collection Time: 10/31/15 12:32 PM  Result Value Ref Range   Alcohol, Ethyl (B) <5 <5 mg/dL    Comment:  LOWEST DETECTABLE LIMIT FOR SERUM ALCOHOL IS 5 mg/dL FOR MEDICAL PURPOSES ONLY   Acetaminophen level     Status: Abnormal   Collection Time: 10/31/15 12:32 PM  Result Value Ref Range   Acetaminophen (Tylenol), Serum <10 (L) 10 - 30 ug/mL    Comment:        THERAPEUTIC CONCENTRATIONS VARY SIGNIFICANTLY. A RANGE OF 10-30 ug/mL MAY BE AN EFFECTIVE CONCENTRATION FOR MANY PATIENTS. HOWEVER, SOME ARE BEST TREATED AT CONCENTRATIONS OUTSIDE THIS RANGE. ACETAMINOPHEN CONCENTRATIONS >150 ug/mL AT 4 HOURS AFTER INGESTION AND >50 ug/mL AT 12 HOURS AFTER INGESTION ARE OFTEN ASSOCIATED WITH TOXIC REACTIONS.   I-stat troponin, ED     Status: None   Collection Time: 10/31/15 12:37 PM  Result Value Ref Range   Troponin i, poc 0.05 0.00 - 0.08 ng/mL   Comment 3            Comment: Due to the release kinetics of cTnI, a negative result within the first hours of the onset of symptoms does not rule out myocardial infarction with certainty. If myocardial infarction is still suspected, repeat the test at appropriate intervals.   Urine rapid drug screen (hosp performed)     Status: None   Collection Time: 10/31/15  1:48 PM  Result Value Ref Range   Opiates NONE DETECTED NONE DETECTED   Cocaine NONE DETECTED NONE DETECTED   Benzodiazepines NONE DETECTED NONE DETECTED   Amphetamines NONE DETECTED NONE DETECTED   Tetrahydrocannabinol NONE DETECTED NONE DETECTED   Barbiturates NONE DETECTED NONE DETECTED    Comment:        DRUG SCREEN FOR MEDICAL PURPOSES ONLY.  IF  CONFIRMATION IS NEEDED FOR ANY PURPOSE, NOTIFY LAB WITHIN 5 DAYS.        LOWEST DETECTABLE LIMITS FOR URINE DRUG SCREEN Drug Class       Cutoff (ng/mL) Amphetamine      1000 Barbiturate      200 Benzodiazepine   332 Tricyclics       951 Opiates          300 Cocaine          300 THC              50   Urinalysis, Routine w reflex microscopic (not at [facility])     Status: None   Collection Time: 10/31/15  1:48 PM  Result Value Ref Range   Color, Urine YELLOW YELLOW   APPearance CLEAR CLEAR   Specific Gravity, Urine 1.011 1.005 - 1.030   pH 5.5 5.0 - 8.0   Glucose, UA NEGATIVE NEGATIVE mg/dL   Hgb urine dipstick NEGATIVE NEGATIVE   Bilirubin Urine NEGATIVE NEGATIVE   Ketones, ur NEGATIVE NEGATIVE mg/dL   Protein, ur NEGATIVE NEGATIVE mg/dL   Nitrite NEGATIVE NEGATIVE   Leukocytes, UA NEGATIVE NEGATIVE    Comment: MICROSCOPIC NOT DONE ON URINES WITH NEGATIVE PROTEIN, BLOOD, LEUKOCYTES, NITRITE, OR GLUCOSE <1000 mg/dL.    Current Facility-Administered Medications  Medication Dose Route Frequency Provider Last Rate Last Dose  . albuterol (PROVENTIL HFA;VENTOLIN HFA) 108 (90 Base) MCG/ACT inhaler 2 puff  2 puff Inhalation Q6H PRN Milton Ferguson, MD      . alum hydroxide-mag trisilicate (GAVISCON) 88-41 MG per chewable tablet 1 tablet  1 tablet Oral QHS Milton Ferguson, MD   1 tablet at 11/01/15 [phone]  . aspirin chewable tablet 81 mg  81 mg Oral Daily Milton Ferguson, MD   81 mg at 11/01/15 3016  . atorvastatin (LIPITOR) tablet 10  mg  10 mg Oral Daily Milton Ferguson, MD   10 mg at 11/01/15 0820  . dorzolamide-timolol (COSOPT) 22.3-6.8 MG/ML ophthalmic solution 1 drop  1 drop Both Eyes BID Milton Ferguson, MD   1 drop at 11/01/15 0951  . finasteride (PROSCAR) tablet 5 mg  5 mg Oral Daily Milton Ferguson, MD   5 mg at 11/01/15 1497  . hydrochlorothiazide (HYDRODIURIL) tablet 25 mg  25 mg Oral Daily Milton Ferguson, MD   25 mg at 11/01/15 0263  . lamoTRIgine (LAMICTAL) tablet 200 mg  200 mg Oral BID  Milton Ferguson, MD   200 mg at 11/01/15 0819  . levETIRAcetam (KEPPRA) tablet 1,500 mg  1,500 mg Oral Daily Milton Ferguson, MD   1,500 mg at 11/01/15 7858  . levETIRAcetam (KEPPRA) tablet 2,250 mg  2,250 mg Oral QHS Milton Ferguson, MD   2,250 mg at 10/31/15 2222  . lisinopril (PRINIVIL,ZESTRIL) tablet 5 mg  5 mg Oral BID Milton Ferguson, MD   5 mg at 11/01/15 8502  . multivitamin with minerals tablet 1 tablet  1 tablet Oral Daily Milton Ferguson, MD   1 tablet at 11/01/15 0820  . pantoprazole (PROTONIX) EC tablet 40 mg  40 mg Oral Daily Milton Ferguson, MD   40 mg at 11/01/15 0819  . polyvinyl alcohol (LIQUIFILM TEARS) 1.4 % ophthalmic solution 1 drop  1 drop Both Eyes Q1200 Milton Ferguson, MD      . potassium chloride SA (K-DUR,KLOR-CON) CR tablet 20 mEq  20 mEq Oral Daily Milton Ferguson, MD   20 mEq at 11/01/15 7741   Current Outpatient Prescriptions  Medication Sig Dispense Refill  . albuterol (PROVENTIL HFA;VENTOLIN HFA) 108 (90 BASE) MCG/ACT inhaler Inhale 2 puffs into the lungs every 6 (six) hours as needed for wheezing.    Marland Kitchen alum hydroxide-mag trisilicate (GAVISCON) 28-78 MG CHEW chewable tablet Chew 1 tablet by mouth at bedtime.    Marland Kitchen aspirin 81 MG chewable tablet Chew 81 mg by mouth daily.    Marland Kitchen atorvastatin (LIPITOR) 10 MG tablet Take 10 mg by mouth daily.    . carboxymethylcellulose 1 % ophthalmic solution Place 1 drop into both eyes daily.     . dorzolamide-timolol (COSOPT) 22.3-6.8 MG/ML ophthalmic solution Place 1 drop into both eyes 2 (two) times daily.    . finasteride (PROSCAR) 5 MG tablet Take 5 mg by mouth daily.    . hydrochlorothiazide (HYDRODIURIL) 25 MG tablet Take 25 mg by mouth daily.    Marland Kitchen lamoTRIgine (LAMICTAL) 200 MG tablet Take 200 mg by mouth 2 (two) times daily.    Marland Kitchen levETIRAcetam (KEPPRA) 750 MG tablet Take 1 tablet (750 mg total) by mouth 2 (two) times daily. (Patient taking differently: Take 1,500-2,250 mg by mouth 2 (two) times daily. Take 1500 in the am and 2250 in the  evening .) 120 tablet 3  . lisinopril (PRINIVIL,ZESTRIL) 10 MG tablet Take 1 tablet (10 mg total) by mouth daily. (Patient taking differently: Take 5 mg by mouth 2 (two) times daily. ) 30 tablet 0  . Multiple Vitamin (MULTIVITAMIN WITH MINERALS) TABS Take 1 tablet by mouth daily.    Marland Kitchen omeprazole (PRILOSEC) 20 MG capsule Take 20 mg by mouth daily.    . potassium chloride SA (K-DUR,KLOR-CON) 20 MEQ tablet Take 20 mEq by mouth daily.     . travoprost, benzalkonium, (TRAVATAN) 0.004 % ophthalmic solution Place 1 drop into both eyes at bedtime.      Musculoskeletal: Strength & Muscle Tone: within normal limits  Gait & Station: normal Patient leans: N/A  Psychiatric Specialty Exam: Review of Systems  Constitutional: Negative.   HENT: Negative.   Eyes: Negative.   Respiratory: Negative.   Cardiovascular: Negative.   Gastrointestinal: Negative.   Genitourinary: Negative.   Musculoskeletal: Negative.   Skin: Negative.   Neurological: Negative.   Endo/Heme/Allergies: Negative.   Psychiatric/Behavioral: Positive for depression, suicidal ideas and memory loss. The patient is nervous/anxious.     Blood pressure 171/91, pulse 71, temperature 97.5 F (36.4 C), temperature source Oral, resp. rate 18, SpO2 100 %.There is no weight on file to calculate BMI.  General Appearance: Casual  Eye Contact::  Fair  Speech:  Normal Rate  Volume:  Decreased  Mood:  Depressed  Affect:  Blunt  Thought Process:  Tangential with memory lapses  Orientation:  Full (Time, Place, and Person)  Thought Content:  Rumination  Suicidal Thoughts:  Yes.  with intent/plan  Homicidal Thoughts:  No  Memory:  Immediate;   Poor Recent;   Poor Remote;   Poor  Judgement:  Impaired  Insight:  Fair  Psychomotor Activity:  Decreased  Concentration:  Fair  Recall:  Poor  Fund of Knowledge:Fair  Language: Fair  Akathisia:  No  Handed:  Right  AIMS (if indicated):     Assets:  Housing Leisure Time Resilience Social  Support  ADL's:  Intact  Cognition: Impaired,  Moderate and Severe  Sleep:      Treatment Plan Summary: Daily contact with patient to assess and evaluate symptoms and progress in treatment, Medication management and Plan major depression, recurrent, severe without psychotic features:  -Crisis stabilization -medication management:  Continue medical medications and start Celexa 10 mg daily for depression -Individual and family session  Disposition: Recommend psychiatric Inpatient admission when medically cleared.  Waylan Boga, NP 11/01/2015 10:52 AM  Patient seen face-to-face for the psychiatric evaluation with physician extender and case discussed with the treatment team and formulated treatment plan. Patient has significant cognitive deficits including memory lapses, depression and suicidal thoughts with the plan. Patient Brother who has medical care power of attorney also came to the room and provided information for this evaluation.. Reviewed the information documented and agree with the treatment plan.  Victormanuel Mclure,JANARDHAHA R. 11/02/2015 10:32 AM

## 2015-11-02 DIAGNOSIS — F322 Major depressive disorder, single episode, severe without psychotic features: Secondary | ICD-10-CM | POA: Diagnosis not present

## 2015-11-02 LAB — COMPREHENSIVE METABOLIC PANEL
ALK PHOS: 71 U/L (ref 38–126)
ALK PHOS: 68 U/L (ref 38–126)
ALT: 18 U/L (ref 17–63)
ALT: 17 U/L (ref 17–63)
ANION GAP: 9 (ref 5–15)
AST: 26 U/L (ref 15–41)
AST: 26 U/L (ref 15–41)
Albumin: 4.3 g/dL (ref 3.5–5.0)
Albumin: 4.1 g/dL (ref 3.5–5.0)
Anion gap: 10 (ref 5–15)
BILIRUBIN TOTAL: 1.1 mg/dL (ref 0.3–1.2)
BUN: 36 mg/dL — AB (ref 6–20)
BUN: 27 mg/dL — ABNORMAL HIGH (ref 6–20)
CALCIUM: 9.6 mg/dL (ref 8.9–10.3)
CALCIUM: 9.6 mg/dL (ref 8.9–10.3)
CHLORIDE: 105 mmol/L (ref 101–111)
CO2: 23 mmol/L (ref 22–32)
CO2: 25 mmol/L (ref 22–32)
CREATININE: 1.69 mg/dL — AB (ref 0.61–1.24)
Chloride: 105 mmol/L (ref 101–111)
Creatinine, Ser: 1.5 mg/dL — ABNORMAL HIGH (ref 0.61–1.24)
GFR calc non Af Amer: 45 mL/min — ABNORMAL LOW (ref >60)
GFR, EST AFRICAN AMERICAN: 45 mL/min — AB (ref >60)
GFR, EST AFRICAN AMERICAN: 52 mL/min — AB (ref >60)
GFR, EST NON AFRICAN AMERICAN: 39 mL/min — AB (ref >60)
Glucose, Bld: 105 mg/dL — ABNORMAL HIGH (ref 65–99)
Glucose, Bld: 110 mg/dL — ABNORMAL HIGH (ref 65–99)
POTASSIUM: 4.3 mmol/L (ref 3.5–5.1)
Potassium: 4.3 mmol/L (ref 3.5–5.1)
SODIUM: 139 mmol/L (ref 135–145)
Sodium: 138 mmol/L (ref 135–145)
TOTAL PROTEIN: 8.1 g/dL (ref 6.5–8.1)
Total Bilirubin: 0.8 mg/dL (ref 0.3–1.2)
Total Protein: 7.8 g/dL (ref 6.5–8.1)

## 2015-11-02 MED ORDER — SODIUM CHLORIDE 0.9 % IV SOLN
Freq: Once | INTRAVENOUS | Status: AC
  Administered 2015-11-02: 10:00:00 via INTRAVENOUS

## 2015-11-02 MED ORDER — SODIUM CHLORIDE 0.9 % IV BOLUS (SEPSIS)
1000.0000 mL | Freq: Once | INTRAVENOUS | Status: AC
  Administered 2015-11-02: 1000 mL via INTRAVENOUS

## 2015-11-02 NOTE — ED Notes (Signed)
Peggy from [facility] called to state that Pt has been accepted there.  They are not anticipated to have a room for Pt until approx. 2000 on 11/03/15.  Phone for Gigi Gin is [phone] and fax is [phone].  This RN made Peggy aware of Pts elevated BUN/CRT to be sure it was not issue and will fax results of next CMP

## 2015-11-02 NOTE — BH Assessment (Signed)
Patient was reassessed by TTS.   Patient is laying in his bed watching television. Patient is alert and oriented to person, place, and time. Patient is able to tell me his first and last name, name and location of the hospital, and today's date.  Patient denies SI/HI att this time but states that sometimes he thinks that he sees his brother but later realizes his brother is not there. Patient states that he feels that he "loses time." Patient does not appear to be responding to internal stimuli.  Patient asked if he would be able to go home or if he would be sent to another hospital. Patient was informed that we were continuing looking for placement at this time. Patient answered questions appropriately and was assessed alone.   Patients brother was in the patients room and states that he should be present due to being healthcare power of attorney. Patients brother states that [facility] does not have paperwork on file. Patients brother was informed that we would enforce healthcare POA in the case of the patient being unable to speak for himself and that we would need to have the paperwork on file. This Clinical research associate informed the patients brother that the patient would need to speak with this Clinical research associate alone for the assessment and he an enter the room once the assessment is complete. Patients brother states that he would go to the car and get the Healthcare POA paperwork for the hospital. Informed patients nurse that he would return the paperwork needed for the patients chart.    Consulted with Dr. Ladona Ridgel and Nanine Means, DNP who continue to recommend inpatient treatment at this time.   Davina Poke, LCSW Therapeutic Triage Specialist [facility] 11/02/2015 9:59 AM'

## 2015-11-02 NOTE — ED Notes (Signed)
One blood draw attempt by RN

## 2015-11-02 NOTE — ED Notes (Signed)
Pharmacy contacted about missing 2200 Keppra dose

## 2015-11-02 NOTE — ED Notes (Signed)
2nd blood draw attempt by RN

## 2015-11-02 NOTE — ED Notes (Signed)
Pt brother, Harlin Rain, can be reached at [phone]

## 2015-11-02 NOTE — ED Notes (Signed)
Patient Placement is faxing over the new CMP results to [facility]

## 2015-11-03 DIAGNOSIS — F322 Major depressive disorder, single episode, severe without psychotic features: Secondary | ICD-10-CM | POA: Diagnosis not present

## 2015-11-03 DIAGNOSIS — F329 Major depressive disorder, single episode, unspecified: Secondary | ICD-10-CM | POA: Insufficient documentation

## 2015-11-03 NOTE — ED Notes (Signed)
Please call pt's brother, Fatima Sanger can be reached at [phone] when pt is transferred to [facility].

## 2015-11-03 NOTE — BH Assessment (Signed)
[facility] Assessment Progress Note  At 09:45 this Clinical research associate spoke to [place] at [facility].  She reports that pt has been pre-accepted for admission to their facility, but that they will need to discharge patients before this pt can be transferred.  They anticipate discharges later today.  She will call when they are ready to receive pt.  At that time they will provide the name of the accepting physician and the phone number where report can be called.  I provided my phone number for her to reach me.  Doylene Canning, MA Triage Specialist [phone]

## 2015-11-03 NOTE — Progress Notes (Addendum)
CSW received recommendation in Quality Collaborative meeting to ensure patient is IVC'd and to ensure transport for patient is coordinated for patient to make sure bed remains available for him. CSW staffed with TTS and patient has been IVC'd since February 25th. CSW also staffed with Charge Nurse to ensure coordination for patient's transport is in place to make sure bed remains for him. Charge Nurse reports the sheriff's office does not transport after 7:00pm. CSW staffed with TTS and was informed that [facility] will call when they are ready for patient.   CSW called [facility] and spoke with Corrie Dandy to see if patient would be able to be transported at an earlier time. Corrie Dandy stated they were currently waiting for a patient to be discharged and when that patient is discharged, she would call back to inform that patient could be transported to their facility.     Elenore Paddy [phone] ED CSW 11/03/2015 11:25 AM

## 2015-11-03 NOTE — ED Notes (Signed)
VS done at 1745 clled to [facility], RN [facility]

## 2015-11-03 NOTE — BH Assessment (Signed)
[facility] Assessment Progress Note  Per Carolanne Grumbling, MD, this pt requires psychiatric hospitalization at this time.  Corrie Dandy, RN, at [facility] reported earlier today that they anticipated having a bed for this pt later today.  At 15:44 [facility] calls again to report that pt has been accepted to their facility by Lowanda Foster, MD.  Nanine Means, NP, concurs with this decision.  Pt's nurse has been notified and agrees to call report to [phone].  Pt is to be transported via [facility].  Doylene Canning, MA Triage Specialist [phone]

## 2015-11-03 NOTE — ED Notes (Signed)
Report given to Corrie Dandy, Charity fundraiser at [facility] psych facility.

## 2015-11-03 NOTE — ED Notes (Signed)
[place]/pt's POA/brother called and wanted the patient to be possibly discharged to a [place] facility

## 2015-11-03 NOTE — ED Notes (Signed)
Recent labs and vital signs sent by fax (as requested) to the facility doing the evaluation for transfer.  Faxed to [phone].

## 2015-11-04 DIAGNOSIS — Z951 Presence of aortocoronary bypass graft: Secondary | ICD-10-CM | POA: Insufficient documentation

## 2015-11-04 DIAGNOSIS — Z8669 Personal history of other diseases of the nervous system and sense organs: Secondary | ICD-10-CM | POA: Insufficient documentation

## 2015-12-31 ENCOUNTER — Non-Acute Institutional Stay (SKILLED_NURSING_FACILITY): Payer: Medicare Other | Admitting: Adult Health

## 2015-12-31 ENCOUNTER — Encounter: Payer: Self-pay | Admitting: Adult Health

## 2015-12-31 DIAGNOSIS — I4892 Unspecified atrial flutter: Secondary | ICD-10-CM

## 2015-12-31 DIAGNOSIS — I25709 Atherosclerosis of coronary artery bypass graft(s), unspecified, with unspecified angina pectoris: Secondary | ICD-10-CM

## 2015-12-31 DIAGNOSIS — F322 Major depressive disorder, single episode, severe without psychotic features: Secondary | ICD-10-CM | POA: Diagnosis not present

## 2015-12-31 DIAGNOSIS — I110 Hypertensive heart disease with heart failure: Secondary | ICD-10-CM | POA: Diagnosis not present

## 2015-12-31 DIAGNOSIS — G40909 Epilepsy, unspecified, not intractable, without status epilepticus: Secondary | ICD-10-CM

## 2015-12-31 DIAGNOSIS — N400 Enlarged prostate without lower urinary tract symptoms: Secondary | ICD-10-CM | POA: Diagnosis not present

## 2015-12-31 DIAGNOSIS — I119 Hypertensive heart disease without heart failure: Secondary | ICD-10-CM | POA: Insufficient documentation

## 2015-12-31 DIAGNOSIS — E785 Hyperlipidemia, unspecified: Secondary | ICD-10-CM | POA: Diagnosis not present

## 2015-12-31 DIAGNOSIS — D509 Iron deficiency anemia, unspecified: Secondary | ICD-10-CM | POA: Insufficient documentation

## 2015-12-31 DIAGNOSIS — I2510 Atherosclerotic heart disease of native coronary artery without angina pectoris: Secondary | ICD-10-CM | POA: Insufficient documentation

## 2015-12-31 DIAGNOSIS — F03918 Unspecified dementia, unspecified severity, with other behavioral disturbance: Secondary | ICD-10-CM

## 2015-12-31 DIAGNOSIS — I4891 Unspecified atrial fibrillation: Secondary | ICD-10-CM

## 2015-12-31 DIAGNOSIS — F0391 Unspecified dementia with behavioral disturbance: Secondary | ICD-10-CM

## 2015-12-31 DIAGNOSIS — K219 Gastro-esophageal reflux disease without esophagitis: Secondary | ICD-10-CM | POA: Diagnosis not present

## 2015-12-31 DIAGNOSIS — I519 Heart disease, unspecified: Secondary | ICD-10-CM | POA: Insufficient documentation

## 2015-12-31 NOTE — Progress Notes (Signed)
Patient ID: [name], male   DOB: 1946/03/04, 70 y.o.   MRN: [id number]   Facility: [facility]       Allergies  Allergen Reactions  . Codeine Rash    Chief Complaint  Patient presents with  . Hospitalization Follow-up    Hospital follow up    HPI:  He had been hospitalized following an unwitnessed fall. He was taken to the hospital for rhabdomyolysis. His TEE shows an EF of 45% with left ventricle regional impairment. He was also found to have pulmonary edema and bilateral pleural effusions. At this time he is here for short term rehab. At this time; I am not certain if going home will be appropriate for him. He is complaining of shortness of breath randomly due the day without known drops in 02 sats.     Past Medical History  Diagnosis Date  . Hypertension   . BPH (benign prostatic hyperplasia)   . Syncope and collapse 08/07/2012    "w/loss of consciousness; found down; don't know how long he was out" (08/07/2012)  . H/O hiatal hernia   . Seizures (HCC)     "h/o gran mal & petit" (08/07/2012)  . Stroke [facility]) 1980's    "mini w/seizure at the same time" (08/07/2012)  . Coronary artery disease   . LBBB (left bundle branch block)   . Seizure disorder (HCC) 08/09/2012  . Dementia with behavioral disturbance 11/01/2015  . Rib fracture 09/18/2012  . Suicidal ideation      Past Surgical History  Procedure Laterality Date  . Coronary artery bypass graft  ~ 2010    CABG X1  . Tibia fracture surgery  1960's    RLE    VITAL SIGNS BP 113/73 mmHg  Pulse 107  Temp(Src) 98.1 F (36.7 C) (Oral)  Resp 18  Ht  (1.676 m)  Wt 165 lb (74.844 kg)  BMI 26.64 kg/m2  Patient's Medications  New Prescriptions   No medications on file  Previous Medications   ASPIRIN 325 MG TABLET    Take 325 mg by mouth daily.   BRIMONIDINE (ALPHAGAN) 0.2 % OPHTHALMIC SOLUTION    Place into both eyes 2 (two) times daily.   ESCITALOPRAM (LEXAPRO) 10 MG TABLET    Take 10 mg by mouth at  bedtime.   FERROUS SULFATE 325 (65 FE) MG TABLET    Take 325 mg by mouth 3 (three) times daily with meals.   FINASTERIDE (PROSCAR) 5 MG TABLET    Take 5 mg by mouth daily.   FUROSEMIDE (LASIX) 40 MG TABLET    Take 40 mg by mouth daily.   LAMOTRIGINE (LAMICTAL) 200 MG TABLET    Take 200 mg by mouth 2 (two) times daily.   LATANOPROST (XALATAN) 0.005 % OPHTHALMIC SOLUTION    Place 1 drop into both eyes at bedtime.   LEVETIRACETAM (KEPPRA) 750 MG TABLET    Take 1 tablet (750 mg total) by mouth 2 (two) times daily.   MEMANTINE (NAMENDA) 10 MG TABLET    Take 10 mg by mouth 2 (two) times daily.   METOPROLOL TARTRATE (LOPRESSOR) 25 MG TABLET    Take 25 mg by mouth every 6 (six) hours.   MULTIPLE VITAMIN (MULTIVITAMIN WITH MINERALS) TABS    Take 1 tablet by mouth daily.   OMEPRAZOLE (PRILOSEC) 20 MG CAPSULE    Take 20 mg by mouth daily.   PRAVASTATIN (PRAVACHOL) 40 MG TABLET    Take 40 mg by mouth at bedtime.  TRAZODONE (DESYREL) 50 MG TABLET    Take 50 mg by mouth at bedtime.  Modified Medications   No medications on file  Discontinued Medications     SIGNIFICANT DIAGNOSTIC EXAMS   12-24-15: TEE: mild to moderate bi-atrial enlargement. EF is 45%;Marland Kitchen The left ventricle is regionally impaired (septal/inferior hypo contractility). The IVC is normal. Compared to prior echo report 03-16-12; changes are noted; wall motion and atrial abnormalities are new.  12-24-15: EEG: diffuse slowing. This EEG was consistent with a nonspecific diffuse cerebral disturbance such as an encephalopathy   12-24-15: ct of head: no calvarial fracture or acute intracranial hemorrhage. redemonstration of encephalomalacia of the right cerebral hemisphere, especially involving the right temporal lobe. The pattern is unchanged. There is cerebral arteriosclerosis.   12-24-15: ct of abdomen and pelvis: trace bilateral pleural effusion with associated consolidations may represent atelectasis, scarring or aspiration. Cannot exclude mild  pulmonary edema. Multiple bilateral rib deformities suggestive of fractures, may be old. New mild hydroureteronephrosis.   12-27-15: chest x-ray: most likely compatible with pulmonary edema. Small bilateral pleural effusion. Likely atelectasis of the lung bases.   LABS REVIEWED:   None available    Review of Systems  Constitutional: Negative for malaise/fatigue.  Respiratory: Positive for shortness of breath. Negative for cough.   Cardiovascular: Positive for leg swelling. Negative for chest pain and palpitations.  Gastrointestinal: Negative for heartburn, abdominal pain and constipation.  Musculoskeletal: Negative for myalgias, back pain and joint pain.  Skin: Negative.   Neurological: Negative for dizziness.  Psychiatric/Behavioral: The patient is not nervous/anxious.      Physical Exam  Constitutional: He is oriented to person, place, and time. No distress.  Eyes: Conjunctivae are normal.  Neck: Neck supple. No JVD present. No thyromegaly present.  Cardiovascular: Normal rate, regular rhythm and intact distal pulses.   Respiratory: Effort normal and breath sounds normal. No respiratory distress. He has no wheezes.  GI: Soft. Bowel sounds are normal. He exhibits no distension. There is no tenderness.  Musculoskeletal: He exhibits no edema.  Able to move all extremities   Lymphadenopathy:    He has no cervical adenopathy.  Neurological: He is alert and oriented to person, place, and time.  Skin: Skin is warm and dry. He is not diaphoretic.  Psychiatric: He has a normal mood and affect.      ASSESSMENT/ PLAN:  1. Afib/aflutter: heart rate is stable; will continue asa 325 mg daily will continue lopressor 25 mg every 6 hours for rate control will monitor  2. 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 Iron deficiency anemia: will continue iron three times  daily   4. Gerd: will continue prilosec 20 mg daily  5. BPH: will continue proscar 5 mg daily  6. Major depression: severe: will continue lexapro 10 mg daily; takes trazodone 50 mg nightly for sleep; he has a history of suicidal ideation in Feb 2017.   7. Hypertension: will continue lopressor 25 mg every 6 hours will monitor  8. Dyslipidemia: will continue pravachol 40 mg daily  9. Vascular dementia: per his family his functional status has declined; will continue namenda 10 mg twice daily   10. Seizures: no reports of seizure activity:   Will continue lamctial 200 mg twice daily and will continue keppra 750 mg twice daily   11. Glaucoma: will continue alphagan to both eyes twice  daily and will continue xalatan to both eyes nightly   12. History of cva: is neurologically stable will continue asa 325 mg daily  13. CAD is status post cabg X 1: no complaints of chest pain; does have periodic episodes of shortness of breath; will continue asa 325 mg daily   14. Rhabdomyolysis:  Does have some muscle pain present; will continue to monitor his status; will repeat ck;     Will check cbc; cmp; lipids; BNP; CK; iron studies   Time spent with patient 50   minutes >50% time spent counseling; reviewing medical record; tests; labs; and developing future plan of care     Synthia Innocenteborah Green NP [facility]  Contact [phone] Monday through Friday 8am- 5pm  After hours call [phone]

## 2016-01-05 ENCOUNTER — Encounter: Payer: Self-pay | Admitting: Internal Medicine

## 2016-01-05 ENCOUNTER — Non-Acute Institutional Stay (SKILLED_NURSING_FACILITY): Payer: Medicare Other | Admitting: Internal Medicine

## 2016-01-05 DIAGNOSIS — I4892 Unspecified atrial flutter: Secondary | ICD-10-CM | POA: Diagnosis not present

## 2016-01-05 DIAGNOSIS — G40909 Epilepsy, unspecified, not intractable, without status epilepticus: Secondary | ICD-10-CM

## 2016-01-05 DIAGNOSIS — E785 Hyperlipidemia, unspecified: Secondary | ICD-10-CM

## 2016-01-05 DIAGNOSIS — I25709 Atherosclerosis of coronary artery bypass graft(s), unspecified, with unspecified angina pectoris: Secondary | ICD-10-CM | POA: Diagnosis not present

## 2016-01-05 DIAGNOSIS — R5381 Other malaise: Secondary | ICD-10-CM | POA: Diagnosis not present

## 2016-01-05 DIAGNOSIS — K219 Gastro-esophageal reflux disease without esophagitis: Secondary | ICD-10-CM

## 2016-01-05 DIAGNOSIS — N400 Enlarged prostate without lower urinary tract symptoms: Secondary | ICD-10-CM | POA: Diagnosis not present

## 2016-01-05 DIAGNOSIS — F322 Major depressive disorder, single episode, severe without psychotic features: Secondary | ICD-10-CM | POA: Diagnosis not present

## 2016-01-05 DIAGNOSIS — I4891 Unspecified atrial fibrillation: Secondary | ICD-10-CM | POA: Diagnosis not present

## 2016-01-05 DIAGNOSIS — F03918 Unspecified dementia, unspecified severity, with other behavioral disturbance: Secondary | ICD-10-CM

## 2016-01-05 DIAGNOSIS — R931 Abnormal findings on diagnostic imaging of heart and coronary circulation: Secondary | ICD-10-CM | POA: Diagnosis not present

## 2016-01-05 DIAGNOSIS — D509 Iron deficiency anemia, unspecified: Secondary | ICD-10-CM | POA: Diagnosis not present

## 2016-01-05 DIAGNOSIS — F0391 Unspecified dementia with behavioral disturbance: Secondary | ICD-10-CM

## 2016-01-05 NOTE — Progress Notes (Signed)
Patient ID: [name], male   DOB: 03-18-1946, 70 y.o.   MRN: [id number]    HISTORY AND PHYSICAL   DATE: 01/05/16  Location:  [place] Room Number: 157 A Place of Service: SNF (31)   Extended Emergency Contact Information Primary Emergency Contact: [place] Phone: [phone] Mobile Phone: [phone] Relation: Brother Secondary Emergency Contact: [place] of [place] Mobile Phone: [phone] Relation: Relative  Advanced Directive information Does patient have an advance directive?: No, Would patient like information on creating an advanced directive?: No - patient declined information FULL CODE Chief Complaint  Patient presents with  . New Admit To SNF    HPI:  70 yo male seen today as a new admission into SNF following hospital stay at SNF for rhabdomyolysis s/p fall, seizure d/o, dementia, afib/aflutter, BPH, MDD, CAD, iron deficiency anemia, GERD, hyperlipidemia. Initial CK >5000. a1c 5.8% and B12 level 953 in mar 2017. He presents to SNF from [facility] for short term rehab  He c/o SOB with occasional wheezing. No weakness. No nursing issues. No falls. He is a poor historian due to dementia. Hx obtained from chart.  Afib/aflutter - rate controlled on lopressor 25 mg every 6 hours. Takes ASA daily for anticoagulation  Left ventricular dysfunction -  EF45% at the [place]. Repeat echo at [facility] in March  showed nml EF but septal motion c/w conduction abnormality; L>R dilated atrium; mild MR. He has not seen CHF clinic yet. Takes lasix 40 mg daily   Iron deficiency anemia - takes  iron three times daily. Hgb 13.6  GERD - stable on prilosec 20 mg daily  BPH - stable on proscar 5 mg daily  Major depression, severe - takes lexapro 10 mg daily; trazodone 50 mg nightly for sleep; [facility] in [place], [place] for suicidal ideation in  Feb 2017.   Hypertension - BP stable on lopressor 25 mg every 6 hours  Dyslipidemia - stable on pravachol 40 mg daily  Vascular dementia - per family his functional status has declined; he takes namenda 10 mg twice daily   Seizures - stable on  lamctial 200 mg twice daily and keppra 750 mg twice daily   Glaucoma - stable on alphagan to both eyes twice daily; xalatan to both eyes nightly   History of cva -  Stable on ASA 325 mg daily  CAD - s/p CABG x 1. Takes ASA daily. No CP  Past Medical History  Diagnosis Date  . Hypertension   . BPH (benign prostatic hyperplasia)   . Syncope and collapse 08/07/2012    "w/loss of consciousness; found down; don't know how long he was out" (08/07/2012)  . H/O hiatal hernia   . Seizures ([place])     "h/o gran mal & petit" (08/07/2012)  . Stroke [facility]) 1980's    "mini w/seizure at the same time" (08/07/2012)  . Coronary artery disease   . LBBB (left bundle branch block)   . Seizure disorder ([place]) 08/09/2012  . Dementia with behavioral disturbance 11/01/2015  . Rib fracture 09/18/2012  . Suicidal ideation     Past Surgical History  Procedure Laterality Date  . Coronary artery bypass graft  ~ 2010    CABG X1  . Tibia fracture surgery  1960's    RLE    Patient Care Team: [facility] as PCP -  General Hotel manager)  Social History   Social History  . Marital Status: Legally Separated    Spouse Name: N/A  . Number of Children: N/A  . Years of Education: N/A   Occupational History  . Not on file.   Social History Main Topics  . Smoking status: Never Smoker   . Smokeless tobacco: Never Used  . Alcohol Use: No  . Drug Use: No  . Sexual Activity: Not on file   Other Topics Concern  . Not on file   Social History Narrative     reports that he has never smoked. He has never used smokeless tobacco. He reports that he does not drink alcohol or use illicit drugs.  Family History  Problem Relation Age of Onset  . Heart  disease      No family history   No family status information on file.    Immunization History  Administered Date(s) Administered  . Influenza-Unspecified 06/07/2012    Allergies  Allergen Reactions  . Codeine Rash    Medications: Patient's Medications  New Prescriptions   No medications on file  Previous Medications   ASPIRIN 325 MG TABLET    Take 325 mg by mouth daily.   BRIMONIDINE (ALPHAGAN) 0.2 % OPHTHALMIC SOLUTION    Place into both eyes 2 (two) times daily. Reported on 01/05/2016   ESCITALOPRAM (LEXAPRO) 10 MG TABLET    Take 10 mg by mouth at bedtime.   FERROUS SULFATE 325 (65 FE) MG TABLET    Take 325 mg by mouth 3 (three) times daily with meals.   FINASTERIDE (PROSCAR) 5 MG TABLET    Take 5 mg by mouth daily.   FUROSEMIDE (LASIX) 40 MG TABLET    Take 40 mg by mouth daily.   LAMOTRIGINE (LAMICTAL) 200 MG TABLET    Take 200 mg by mouth 2 (two) times daily.   LATANOPROST (XALATAN) 0.005 % OPHTHALMIC SOLUTION    Place 1 drop into both eyes at bedtime.   LEVETIRACETAM (KEPPRA) 750 MG TABLET    Take 1,500 mg by mouth 2 (two) times daily.   MEMANTINE (NAMENDA) 10 MG TABLET    Take 10 mg by mouth 2 (two) times daily.   METOPROLOL TARTRATE (LOPRESSOR) 25 MG TABLET    Take 25 mg by mouth every 6 (six) hours.   MULTIPLE VITAMIN (MULTIVITAMIN WITH MINERALS) TABS    Take 1 tablet by mouth daily.   OMEPRAZOLE (PRILOSEC) 20 MG CAPSULE    Take 20 mg by mouth daily.   TRAZODONE (DESYREL) 50 MG TABLET    Take 50 mg by mouth at bedtime.  Modified Medications   No medications on file  Discontinued Medications   LEVETIRACETAM (KEPPRA) 750 MG TABLET    Take 1 tablet (750 mg total) by mouth 2 (two) times daily.   PRAVASTATIN (PRAVACHOL) 40 MG TABLET    Take 40 mg by mouth at bedtime.    Review of Systems  Unable to perform ROS: Dementia    Filed Vitals:   01/05/16 1125  BP: 118/57  Pulse: 66  Temp: 97.9 F (36.6 C)  TempSrc: Oral  Resp: 20  Height: _0  (1.778 m)  Weight:  196 lb 4.8 oz (89.041 kg)  SpO2: 94%   Body mass index is 28.17 kg/(m^2).  Physical Exam  Constitutional: He appears well-developed.  Frail appearing sitting on bed in NAD.  HENT:  Mouth/Throat: Oropharynx is clear and moist.  Eyes: Pupils are equal, round, and reactive to light.  No scleral icterus.  Neck: Neck supple. Carotid bruit is not present. No thyromegaly present.  Cardiovascular: Normal rate and intact distal pulses.  An irregularly irregular rhythm present. Exam reveals no gallop and no friction rub.   Murmur heard.  Systolic murmur is present with a grade of 1/6  +2 pitting R>LLE edema. No calf TTP  Pulmonary/Chest: Effort normal and breath sounds normal. He has no wheezes. He has no rales. He exhibits no tenderness.  Abdominal: Soft. Bowel sounds are normal. He exhibits no distension, no abdominal bruit, no pulsatile midline mass and no mass. There is no tenderness. There is no rebound and no guarding.  Musculoskeletal: He exhibits edema.  Lymphadenopathy:    He has no cervical adenopathy.  Neurological: He is alert.  Skin: Skin is warm and dry. No rash noted.  Psychiatric: He has a normal mood and affect. His behavior is normal.     Labs reviewed: Admission on 10/31/2015, Discharged on 11/03/2015  Component Date Value Ref Range Status  . WBC 10/31/2015 7.0  4.0 - 10.5 K/uL Final  . RBC 10/31/2015 4.89  4.22 - 5.81 MIL/uL Final  . Hemoglobin 10/31/2015 13.6  13.0 - 17.0 g/dL Final  . HCT 10/31/2015 40.1  39.0 - 52.0 % Final  . MCV 10/31/2015 82.0  78.0 - 100.0 fL Final  . MCH 10/31/2015 27.8  26.0 - 34.0 pg Final  . MCHC 10/31/2015 33.9  30.0 - 36.0 g/dL Final  . RDW 10/31/2015 13.4  11.5 - 15.5 % Final  . Platelets 10/31/2015 191  150 - 400 K/uL Final  . Neutrophils Relative % 10/31/2015 72   Final  . Neutro Abs 10/31/2015 5.1  1.7 - 7.7 K/uL Final  . Lymphocytes Relative 10/31/2015 18   Final  . Lymphs Abs 10/31/2015 1.2  0.7 - 4.0 K/uL Final  . Monocytes  Relative 10/31/2015 8   Final  . Monocytes Absolute 10/31/2015 0.6  0.1 - 1.0 K/uL Final  . Eosinophils Relative 10/31/2015 2   Final  . Eosinophils Absolute 10/31/2015 0.1  0.0 - 0.7 K/uL Final  . Basophils Relative 10/31/2015 0   Final  . Basophils Absolute 10/31/2015 0.0  0.0 - 0.1 K/uL Final  . Sodium 10/31/2015 140  135 - 145 mmol/L Final  . Potassium 10/31/2015 4.2  3.5 - 5.1 mmol/L Final  . Chloride 10/31/2015 107  101 - 111 mmol/L Final  . CO2 10/31/2015 23  22 - 32 mmol/L Final  . Glucose, Bld 10/31/2015 94  65 - 99 mg/dL Final  . BUN 10/31/2015 27* 6 - 20 mg/dL Final  . Creatinine, Ser 10/31/2015 1.61* 0.61 - 1.24 mg/dL Final  . Calcium 10/31/2015 9.8  8.9 - 10.3 mg/dL Final  . Total Protein 10/31/2015 8.4* 6.5 - 8.1 g/dL Final  . Albumin 10/31/2015 4.5  3.5 - 5.0 g/dL Final  . AST 10/31/2015 24  15 - 41 U/L Final  . ALT 10/31/2015 17  17 - 63 U/L Final  . Alkaline Phosphatase 10/31/2015 68  38 - 126 U/L Final  . Total Bilirubin 10/31/2015 0.8  0.3 - 1.2 mg/dL Final  . GFR calc non Af Amer 10/31/2015 41* >60 mL/min Final  . GFR calc Af Amer 10/31/2015 48* >60 mL/min Final   Comment: (NOTE) The eGFR has been calculated using the CKD EPI equation. This calculation has not been validated in all clinical situations. eGFR's persistently <60 mL/min signify possible Chronic Kidney Disease.   . Anion gap 10/31/2015 10  5 -  15 Final  . Alcohol, Ethyl (B) 10/31/2015 <5  <5 mg/dL Final   Comment:        LOWEST DETECTABLE LIMIT FOR SERUM ALCOHOL IS 5 mg/dL FOR MEDICAL PURPOSES ONLY   . Acetaminophen (Tylenol), Serum 10/31/2015 <10* 10 - 30 ug/mL Final   Comment:        THERAPEUTIC CONCENTRATIONS VARY SIGNIFICANTLY. A RANGE OF 10-30 ug/mL MAY BE AN EFFECTIVE CONCENTRATION FOR MANY PATIENTS. HOWEVER, SOME ARE BEST TREATED AT CONCENTRATIONS OUTSIDE THIS RANGE. ACETAMINOPHEN CONCENTRATIONS >150 ug/mL AT 4 HOURS AFTER INGESTION AND >50 ug/mL AT 12 HOURS AFTER INGESTION  ARE OFTEN ASSOCIATED WITH TOXIC REACTIONS.   Marland Kitchen Opiates 10/31/2015 NONE DETECTED  NONE DETECTED Final  . Cocaine 10/31/2015 NONE DETECTED  NONE DETECTED Final  . Benzodiazepines 10/31/2015 NONE DETECTED  NONE DETECTED Final  . Amphetamines 10/31/2015 NONE DETECTED  NONE DETECTED Final  . Tetrahydrocannabinol 10/31/2015 NONE DETECTED  NONE DETECTED Final  . Barbiturates 10/31/2015 NONE DETECTED  NONE DETECTED Final   Comment:        DRUG SCREEN FOR MEDICAL PURPOSES ONLY.  IF CONFIRMATION IS NEEDED FOR ANY PURPOSE, NOTIFY LAB WITHIN 5 DAYS.        LOWEST DETECTABLE LIMITS FOR URINE DRUG SCREEN Drug Class       Cutoff (ng/mL) Amphetamine      1000 Barbiturate      200 Benzodiazepine   332 Tricyclics       951 Opiates          300 Cocaine          300 THC              50   . Troponin i, poc 10/31/2015 0.05  0.00 - 0.08 ng/mL Final  . Comment 3 10/31/2015          Final   Comment: Due to the release kinetics of cTnI, a negative result within the first hours of the onset of symptoms does not rule out myocardial infarction with certainty. If myocardial infarction is still suspected, repeat the test at appropriate intervals.   . Color, Urine 10/31/2015 YELLOW  YELLOW Final  . APPearance 10/31/2015 CLEAR  CLEAR Final  . Specific Gravity, Urine 10/31/2015 1.011  1.005 - 1.030 Final  . pH 10/31/2015 5.5  5.0 - 8.0 Final  . Glucose, UA 10/31/2015 NEGATIVE  NEGATIVE mg/dL Final  . Hgb urine dipstick 10/31/2015 NEGATIVE  NEGATIVE Final  . Bilirubin Urine 10/31/2015 NEGATIVE  NEGATIVE Final  . Ketones, ur 10/31/2015 NEGATIVE  NEGATIVE mg/dL Final  . Protein, ur 10/31/2015 NEGATIVE  NEGATIVE mg/dL Final  . Nitrite 10/31/2015 NEGATIVE  NEGATIVE Final  . Leukocytes, UA 10/31/2015 NEGATIVE  NEGATIVE Final   MICROSCOPIC NOT DONE ON URINES WITH NEGATIVE PROTEIN, BLOOD, LEUKOCYTES, NITRITE, OR GLUCOSE <1000 mg/dL.  Marland Kitchen Sodium 11/02/2015 138  135 - 145 mmol/L Final  . Potassium 11/02/2015 4.3   3.5 - 5.1 mmol/L Final  . Chloride 11/02/2015 105  101 - 111 mmol/L Final  . CO2 11/02/2015 23  22 - 32 mmol/L Final  . Glucose, Bld 11/02/2015 105* 65 - 99 mg/dL Final  . BUN 11/02/2015 36* 6 - 20 mg/dL Final  . Creatinine, Ser 11/02/2015 1.69* 0.61 - 1.24 mg/dL Final  . Calcium 11/02/2015 9.6  8.9 - 10.3 mg/dL Final  . Total Protein 11/02/2015 8.1  6.5 - 8.1 g/dL Final  . Albumin 11/02/2015 4.3  3.5 - 5.0 g/dL Final  . AST 11/02/2015 26  15 -  41 U/L Final  . ALT 11/02/2015 18  17 - 63 U/L Final  . Alkaline Phosphatase 11/02/2015 71  38 - 126 U/L Final  . Total Bilirubin 11/02/2015 1.1  0.3 - 1.2 mg/dL Final  . GFR calc non Af Amer 11/02/2015 39* >60 mL/min Final  . GFR calc Af Amer 11/02/2015 45* >60 mL/min Final   Comment: (NOTE) The eGFR has been calculated using the CKD EPI equation. This calculation has not been validated in all clinical situations. eGFR's persistently <60 mL/min signify possible Chronic Kidney Disease.   . Anion gap 11/02/2015 10  5 - 15 Final  . Sodium 11/02/2015 139  135 - 145 mmol/L Final  . Potassium 11/02/2015 4.3  3.5 - 5.1 mmol/L Final  . Chloride 11/02/2015 105  101 - 111 mmol/L Final  . CO2 11/02/2015 25  22 - 32 mmol/L Final  . Glucose, Bld 11/02/2015 110* 65 - 99 mg/dL Final  . BUN 11/02/2015 27* 6 - 20 mg/dL Final  . Creatinine, Ser 11/02/2015 1.50* 0.61 - 1.24 mg/dL Final  . Calcium 11/02/2015 9.6  8.9 - 10.3 mg/dL Final  . Total Protein 11/02/2015 7.8  6.5 - 8.1 g/dL Final  . Albumin 11/02/2015 4.1  3.5 - 5.0 g/dL Final  . AST 11/02/2015 26  15 - 41 U/L Final  . ALT 11/02/2015 17  17 - 63 U/L Final  . Alkaline Phosphatase 11/02/2015 68  38 - 126 U/L Final  . Total Bilirubin 11/02/2015 0.8  0.3 - 1.2 mg/dL Final  . GFR calc non Af Amer 11/02/2015 45* >60 mL/min Final  . GFR calc Af Amer 11/02/2015 52* >60 mL/min Final   Comment: (NOTE) The eGFR has been calculated using the CKD EPI equation. This calculation has not been validated in all  clinical situations. eGFR's persistently <60 mL/min signify possible Chronic Kidney Disease.   . Anion gap 11/02/2015 9  5 - 15 Final    No results found.   Assessment/Plan   ICD-9-CM ICD-10-CM   1. Physical deconditioning 799.3 R53.81   2. Dementia with behavioral disturbance 294.21 F03.91   3. Abnormal echocardiogram 793.2 R93.1   4. Seizure disorder ([place]) 345.90 G40.909   5. Atrial fibrillation and flutter ([place]) 427.31 I48.91    427.32 I48.92   6. Depression, major, single episode, severe ([place]) 296.23 F32.2   7. Anemia, iron deficiency 280.9 D50.9   8. Hyperlipidemia 272.4 E78.5   9. Coronary artery disease involving coronary bypass graft of native heart with unspecified angina pectoris 414.05 I25.709    413.9    10. BPH (benign prostatic hyperplasia) 600.00 N40.0   11. GERD without esophagitis 530.81 K21.9    Cont current meds as ordered  Repeat CK if not done already  F/u with CHF clinic  PT/OT/ST as ordered  GOAL: short term rehab with potential for long term vs ALF. Communicated with pt and nursing.  Will follow

## 2016-01-25 ENCOUNTER — Non-Acute Institutional Stay (SKILLED_NURSING_FACILITY): Payer: Medicare Other | Admitting: Adult Health

## 2016-01-25 ENCOUNTER — Encounter: Payer: Self-pay | Admitting: Adult Health

## 2016-01-25 DIAGNOSIS — I519 Heart disease, unspecified: Secondary | ICD-10-CM

## 2016-01-25 DIAGNOSIS — I4891 Unspecified atrial fibrillation: Secondary | ICD-10-CM

## 2016-01-25 DIAGNOSIS — Z951 Presence of aortocoronary bypass graft: Secondary | ICD-10-CM | POA: Diagnosis not present

## 2016-01-25 DIAGNOSIS — E785 Hyperlipidemia, unspecified: Secondary | ICD-10-CM | POA: Diagnosis not present

## 2016-01-25 DIAGNOSIS — I25709 Atherosclerosis of coronary artery bypass graft(s), unspecified, with unspecified angina pectoris: Secondary | ICD-10-CM

## 2016-01-25 DIAGNOSIS — F0391 Unspecified dementia with behavioral disturbance: Secondary | ICD-10-CM | POA: Diagnosis not present

## 2016-01-25 DIAGNOSIS — I110 Hypertensive heart disease with heart failure: Secondary | ICD-10-CM

## 2016-01-25 DIAGNOSIS — I4892 Unspecified atrial flutter: Secondary | ICD-10-CM

## 2016-01-25 DIAGNOSIS — G40909 Epilepsy, unspecified, not intractable, without status epilepticus: Secondary | ICD-10-CM

## 2016-01-25 DIAGNOSIS — D509 Iron deficiency anemia, unspecified: Secondary | ICD-10-CM | POA: Diagnosis not present

## 2016-01-25 DIAGNOSIS — F03918 Unspecified dementia, unspecified severity, with other behavioral disturbance: Secondary | ICD-10-CM

## 2016-01-25 DIAGNOSIS — N400 Enlarged prostate without lower urinary tract symptoms: Secondary | ICD-10-CM

## 2016-01-25 NOTE — Progress Notes (Signed)
Patient ID: [name], male   DOB: Jun 12, 1946, 70 y.o.   MRN: [id number]    Location:  [facility] Room Number: 157-A Place of Service:  SNF (31)   CODE STATUS: Full Code  Allergies  Allergen Reactions  . Codeine Rash    Chief Complaint  Patient presents with  . Hospitalization Follow-up    Hospital Follow up    HPI:  He went to the [place] for his routine appointment where he collapsed. His workup was negative for seizure or cva as the cause of his encephalopathy. There were no sources of infection found. The possible differentials included hypoactive delirium vs catatonia. He recovered from the first incident and had another one on 01-17-16; which lasted all day; he did not have po intake. During that time he had junctional tachycardia and hypertensive. He was seen by both neurology and psychiatry who were unable to identify the etiology or characterization of these episodes; though they did have a catatonic quality to them such as stupor, negativism and withdrawal as well as autonomic instability. He was not given ativan to the the elevated risks and underlying medical etiology. His namenda was stopped. At this time he is here for short term rehab.    Past Medical History  Diagnosis Date  . Hypertension   . BPH (benign prostatic hyperplasia)   . Syncope and collapse 08/07/2012    "w/loss of consciousness; found down; don't know how long he was out" (08/07/2012)  . H/O hiatal hernia   . Seizures (HCC)     "h/o gran mal & petit" (08/07/2012)  . Stroke [facility]) 1980's    "mini w/seizure at the same time" (08/07/2012)  . Coronary artery disease   . LBBB (left bundle branch block)   . Seizure disorder (HCC) 08/09/2012  . Dementia with behavioral disturbance 11/01/2015  . Rib fracture 09/18/2012  . Suicidal ideation     Past Surgical History  Procedure Laterality Date  . Coronary artery bypass graft  ~ 2010    CABG X1  . Tibia fracture surgery   1960's    RLE    Social History   Social History  . Marital Status: Legally Separated    Spouse Name: N/A  . Number of Children: N/A  . Years of Education: N/A   Occupational History  . Not on file.   Social History Main Topics  . Smoking status: Never Smoker   . Smokeless tobacco: Never Used  . Alcohol Use: No  . Drug Use: No  . Sexual Activity: Not on file   Other Topics Concern  . Not on file   Social History Narrative   Family History  Problem Relation Age of Onset  . Heart disease      No family history      VITAL SIGNS BP 135/72 mmHg  Pulse 107  Temp(Src) 97.5 F (36.4 C) (Oral)  Resp 17  Ht 5\' 10"  (1.778 m)  Wt 161 lb 3 oz (73.114 kg)  BMI 23.13 kg/m2  SpO2 93%  Patient's Medications  New Prescriptions   No medications on file  Previous Medications   ASPIRIN 325 MG TABLET    Take 325 mg by mouth daily.   BRIMONIDINE (ALPHAGAN) 0.2 % OPHTHALMIC SOLUTION    Place into both eyes 2 (two) times daily. Reported on 01/05/2016   ESCITALOPRAM (LEXAPRO) 10 MG TABLET    Take 10 mg by mouth at bedtime.   FERROUS SULFATE 325 (65 FE)  MG TABLET    Take 325 mg by mouth 3 (three) times daily with meals.   FINASTERIDE (PROSCAR) 5 MG TABLET    Take 5 mg by mouth daily.   FUROSEMIDE (LASIX) 40 MG TABLET    Take 40 mg by mouth daily.   LAMOTRIGINE (LAMICTAL) 200 MG TABLET    Take 200 mg by mouth 2 (two) times daily.   LATANOPROST (XALATAN) 0.005 % OPHTHALMIC SOLUTION    Place 1 drop into both eyes at bedtime.   LEVETIRACETAM (KEPPRA) 750 MG TABLET    Take 1,500 mg by mouth 2 (two) times daily.   METOPROLOL TARTRATE (LOPRESSOR) 25 MG TABLET    Take 25 mg by mouth every 6 (six) hours.   MULTIPLE VITAMIN (MULTIVITAMIN WITH MINERALS) TABS    Take 1 tablet by mouth daily.   OMEPRAZOLE (PRILOSEC) 20 MG CAPSULE    Take 20 mg by mouth daily.   ROSUVASTATIN (CRESTOR) 20 MG TABLET    Take 20 mg by mouth daily.  Modified Medications   No medications on file  Discontinued  Medications   MEMANTINE (NAMENDA) 10 MG TABLET    Take 10 mg by mouth 2 (two) times daily. Reported on 01/25/2016   TRAZODONE (DESYREL) 50 MG TABLET    Take 50 mg by mouth at bedtime. Reported on 01/25/2016     SIGNIFICANT DIAGNOSTIC EXAMS  12-24-15: TEE: mild to moderate bi-atrial enlargement. EF is 45%;Marland Kitchen The left ventricle is regionally impaired (septal/inferior hypo contractility). The IVC is normal. Compared to prior echo report 03-16-12; changes are noted; wall motion and atrial abnormalities are new.  12-24-15: EEG: diffuse slowing. This EEG was consistent with a nonspecific diffuse cerebral disturbance such as an encephalopathy   12-24-15: ct of head: no calvarial fracture or acute intracranial hemorrhage. redemonstration of encephalomalacia of the right cerebral hemisphere, especially involving the right temporal lobe. The pattern is unchanged. There is cerebral arteriosclerosis.   12-24-15: ct of abdomen and pelvis: trace bilateral pleural effusion with associated consolidations may represent atelectasis, scarring or aspiration. Cannot exclude mild pulmonary edema. Multiple bilateral rib deformities suggestive of fractures, may be old. New mild hydroureteronephrosis.   12-27-15: chest x-ray: most likely compatible with pulmonary edema. Small bilateral pleural effusion. Likely atelectasis of the lung bases.   01-12-16: ct angio of head and neck: 1. No intracranial occulusion, aneurysm or high-grade stenosis.  2. Mild atherosclerotic disease of the carotid bifurcations without hemodynamically significant stenosis.  3. 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 Left PICA likely fills via retrograde opacification from the basilar artery. Vertebrobasilar system otherwise widely patent.  4. Multiple enlarged upper mediastinal lymph nodes and ground glass apical opacities.,this could be further evaluated with dedicated chest ct if clinically warranted. These are  enlarged from prior chest ct of 2013.   01-12-16 mri of brain; 1. No acute infarct. Chronic right MCA territory infarct again demonstrated.  2. Chronic microvascular ischemic changes are moderately advanced for age and slightly progressed from 2009.  3. Unremarkable examination of the cervical carotid arteries and intracranial anterior circulation, though these structures are better evaluated on same CTA.  4. Non-dominant left vertebral artery is not well visualized, though images are degraded by motion. Occlusion of portions of left V3 and V4 vertebral artery segments with retrograde filling of the left PICA is better demonstrated on same day CTA.   01-12-16: EEG: normal awake and drowsy EEG     LABS REVIEWED:   01-06-16:  wbc 9.1; hgb 8.6; hct 27.7; mcv 77.4; ;plt 299; glucose 81; bun 18; creat 1.12; k+ 4.1; na++141; liver normal albumin 3.5 chol 102; ldl 52; trig 40; hdl 42; iron 23; CK 77; BNP 299.2    Review of Systems  Unable to perform ROS: dementia      Physical Exam  Constitutional: He is oriented to person, place, and time. No distress.  Eyes: Conjunctivae are normal.  Neck: Neck supple. No JVD present. No thyromegaly present.  Cardiovascular: Normal rate, regular rhythm and intact distal pulses.   Respiratory: Effort normal and breath sounds normal. No respiratory distress. He has no wheezes.  GI: Soft. Bowel sounds are normal. He exhibits no distension. There is no tenderness.  Musculoskeletal: 3+ bilateral lower extremity edema   Able to move all extremities   Lymphadenopathy:    He has no cervical adenopathy.  Neurological: He is alert .  Skin: Skin is warm and dry. He is not diaphoretic.  Psychiatric: He has a normal mood and affect.      ASSESSMENT/ PLAN:  1. Afib/aflutter: heart rate is stable; will continue asa 325 mg daily will continue toprol XL 150 mg for rate control will monitor  2. Left ventricular dysfunction with ef 45%:  will place him on daily  weights; will increase his lasix to 40 mg twice daily   3. Iron deficiency anemia: will continue iron three times daily   4. Gerd: will continue prilosec 20 mg daily  5. BPH: will continue proscar 5 mg daily  6. Major depression: severe: will continue lexapro 10 mg daily; he has a history of suicidal ideation in Feb 2017.   7. Hypertension: will continue lopressor 25 mg every 6 hours will monitor  8. Dyslipidemia: will continue crestor 20 mg daily  9. Vascular dementia: per his family his functional status has declined; his namenda was stopped while in the hospital    10. Seizures: no reports of seizure activity:   Will continue lamctial 200 mg twice daily and will continue keppra 750 mg twice daily   11. Glaucoma: will continue alphagan to both eyes twice daily and will continue xalatan to both eyes nightly   12. History of cva: is neurologically stable will continue asa 325 mg daily he did receive TPA on 01-12-16  13. CAD is status post cabg X 1: no complaints of chest pain; does have periodic episodes of shortness of breath; will continue asa 325 mg daily    Will check bmp in one week.     Time spent with patient 50   minutes >50% time spent counseling; reviewing medical record; tests; labs; and developing future plan of care      Synthia InnocentDeborah Rejoice Heatwole NP [facility]  Contact [phone] Monday through Friday 8am- 5pm  After hours call [phone]

## 2016-01-26 ENCOUNTER — Non-Acute Institutional Stay (SKILLED_NURSING_FACILITY): Payer: Medicare Other | Admitting: Internal Medicine

## 2016-01-26 ENCOUNTER — Encounter: Payer: Self-pay | Admitting: Internal Medicine

## 2016-01-26 DIAGNOSIS — E785 Hyperlipidemia, unspecified: Secondary | ICD-10-CM | POA: Diagnosis not present

## 2016-01-26 DIAGNOSIS — R600 Localized edema: Secondary | ICD-10-CM | POA: Diagnosis not present

## 2016-01-26 DIAGNOSIS — F03918 Unspecified dementia, unspecified severity, with other behavioral disturbance: Secondary | ICD-10-CM

## 2016-01-26 DIAGNOSIS — K219 Gastro-esophageal reflux disease without esophagitis: Secondary | ICD-10-CM

## 2016-01-26 DIAGNOSIS — I4892 Unspecified atrial flutter: Secondary | ICD-10-CM | POA: Diagnosis not present

## 2016-01-26 DIAGNOSIS — F0391 Unspecified dementia with behavioral disturbance: Secondary | ICD-10-CM | POA: Diagnosis not present

## 2016-01-26 DIAGNOSIS — K439 Ventral hernia without obstruction or gangrene: Secondary | ICD-10-CM | POA: Diagnosis not present

## 2016-01-26 DIAGNOSIS — R5381 Other malaise: Secondary | ICD-10-CM | POA: Diagnosis not present

## 2016-01-26 DIAGNOSIS — G40909 Epilepsy, unspecified, not intractable, without status epilepticus: Secondary | ICD-10-CM | POA: Diagnosis not present

## 2016-01-26 DIAGNOSIS — I4891 Unspecified atrial fibrillation: Secondary | ICD-10-CM | POA: Diagnosis not present

## 2016-01-26 DIAGNOSIS — N400 Enlarged prostate without lower urinary tract symptoms: Secondary | ICD-10-CM | POA: Diagnosis not present

## 2016-01-26 DIAGNOSIS — F322 Major depressive disorder, single episode, severe without psychotic features: Secondary | ICD-10-CM | POA: Diagnosis not present

## 2016-01-26 NOTE — Progress Notes (Signed)
DATE: 01/26/16  Location:  [place] Room Number: 157A Place of Service: SNF [phone])   Extended Emergency Contact Information Primary Emergency Contact: [place] Phone: [phone] Mobile Phone: [phone] Relation: Brother Secondary Emergency Contact: [place] of [place] Mobile Phone: [phone] Relation: Relative  Advanced Directive information Does patient have an advance directive?: No, Would patient like information on creating an advanced directive?: No - patient declined information FULL CODE Chief Complaint  Patient presents with  . Readmit To SNF    HPI:  70 yo male seen today as a readmission into SNF following hospital stay for change in mental status with collapse while at [facility] for routine visit. W/u neg for sz and CVA although he did receive 1 dose of tPA. He continued to have sporadic incidents of change in mental status (DDX included hypoactive delirium vs catatonia). He was seen by neurology and psych but unable to characterize episodes. No ativan Rx due to elevated risks and underlying medical etiology. Namenda stopped. He was sent back to SNF for short term rehab.   He states he is ready to go home. PT reports he is ambulating well with frequent cues. No falls. Appetite ok. Sleeping well. No falls. He is a poor historian due to dementia. Hx obtained from chart. His brother, Christean Grief, is attempting to get guardianship as pt is not safe to live alone due to dementia.  Afib/aflutter - rate controlled on toprol XL. Takes asa 325 mg daily   Iron deficiency anemia - stable on  iron three times daily   Left ventricular dysfunction/BLE edema -  EF45% at the [place]. Repeat echo at [facility] in March  showed nml EF but septal motion c/w conduction abnormality; L>R dilated atrium; mild MR. He has not seen CHF clinic yet. Takes lasix 40 mg BID   GERD - stable on prilosec 20 mg  daily  BPH - stable on proscar 5 mg daily  Major depression, severe - takes lexapro 10 mg daily; he has a history of suicidal ideation in Feb 2017.   Hypertension - BP stable on lopressor 25 mg every 6 hours   Dyslipidemia - stable on crestor 20 mg daily  Vascular dementia - functional status has declined per family; his namenda was stopped while in the hospital    Seizures - none witnessed. Takes lamctial 200 mg twice daily and keppra 750 mg twice daily   Glaucoma - stable on alphagan to both eyes twice daily and  xalatan to both eyes nightly   History of cva -  stable on asa 325 mg daily. He rec'd tPA on 01-12-16  CAD is status post cabg X 1 - stable overall. He does have periodic episodes of shortness of breath. Takes asa 325 mg daily   Past Medical History  Diagnosis Date  . Hypertension   . BPH (benign prostatic hyperplasia)   . Syncope and collapse 08/07/2012    "w/loss of consciousness; found down; don't know how long he was out" (08/07/2012)  . H/O hiatal hernia   . Seizures ([place])     "h/o gran mal & petit" (08/07/2012)  . Stroke [facility]) 1980's    "mini w/seizure at the same time" (08/07/2012)  . Coronary artery disease   . LBBB (left bundle branch block)   . Dementia with behavioral disturbance 11/01/2015  . Rib fracture 09/18/2012  . Suicidal ideation   .  Glaucoma   . A-fib [facility])     Past Surgical History  Procedure Laterality Date  . Coronary artery bypass graft  ~ 2010    CABG X1  . Tibia fracture surgery  1960's    RLE    Patient Care Team: [facility] as PCP - General (General Practice)  Social History   Social History  . Marital Status: Legally Separated    Spouse Name: N/A  . Number of Children: N/A  . Years of Education: N/A   Occupational History  . Not on file.   Social History Main Topics  . Smoking status: Never Smoker   . Smokeless tobacco: Never Used  . Alcohol Use: No  . Drug Use: No  . Sexual Activity: Not on file    Other Topics Concern  . Not on file   Social History Narrative     reports that he has never smoked. He has never used smokeless tobacco. He reports that he does not drink alcohol or use illicit drugs.  Immunization History  Administered Date(s) Administered  . Influenza-Unspecified 06/07/2012  . PPD Test 01/22/2016    Allergies  Allergen Reactions  . Codeine Rash    Medications: Patient's Medications  New Prescriptions   No medications on file  Previous Medications   ALBUTEROL (PROVENTIL) (2.5 MG/3ML) 0.083% NEBULIZER SOLUTION    Take 2.5 mg by nebulization every 6 (six) hours as needed for wheezing or shortness of breath.   ASPIRIN 325 MG TABLET    Take 325 mg by mouth daily.   BRIMONIDINE (ALPHAGAN) 0.2 % OPHTHALMIC SOLUTION    Place into both eyes 2 (two) times daily. Reported on 01/05/2016   ESCITALOPRAM (LEXAPRO) 10 MG TABLET    Take 10 mg by mouth daily.    FERROUS SULFATE 325 (65 FE) MG TABLET    Take 325 mg by mouth 3 (three) times daily with meals.   FINASTERIDE (PROSCAR) 5 MG TABLET    Take 5 mg by mouth daily.   FUROSEMIDE (LASIX) 40 MG TABLET    Take 40 mg by mouth daily.   FUROSEMIDE (LASIX) 40 MG TABLET    Take 40 mg by mouth 2 (two) times daily.   LAMOTRIGINE (LAMICTAL) 200 MG TABLET    Take 200 mg by mouth 2 (two) times daily.   LATANOPROST (XALATAN) 0.005 % OPHTHALMIC SOLUTION    Place 1 drop into both eyes at bedtime.   LEVETIRACETAM (KEPPRA) 750 MG TABLET    Take 1,500 mg by mouth 2 (two) times daily.   METOPROLOL SUCCINATE (TOPROL-XL) 100 MG 24 HR TABLET    Take 100 mg by mouth daily. Take with or immediately following a meal.   METOPROLOL SUCCINATE (TOPROL-XL) 50 MG 24 HR TABLET    Take 50 mg by mouth daily. Take with or immediately following a meal.   MULTIPLE VITAMIN (MULTIVITAMIN WITH MINERALS) TABS    Take 1 tablet by mouth daily.   OMEPRAZOLE (PRILOSEC) 20 MG CAPSULE    Take 20 mg by mouth daily.   ROSUVASTATIN (CRESTOR) 20 MG TABLET    Take 20 mg  by mouth daily.  Modified Medications   No medications on file  Discontinued Medications   METOPROLOL TARTRATE (LOPRESSOR) 25 MG TABLET    Take 25 mg by mouth every 6 (six) hours. Reported on 01/26/2016    Review of Systems  Unable to perform ROS: Dementia    Filed Vitals:   01/26/16 1047  BP: 135/72  Pulse:  107  Temp: 97.5 F (36.4 C)  TempSrc: Oral  Resp: 17  Height: _0  (1.778 m)  Weight: 161 lb 4.8 oz (73.165 kg)   Body mass index is 23.14 kg/(m^2).  Physical Exam  Constitutional: He appears well-developed.  Frail appearing sitting on bed in NAD.  HENT:  Mouth/Throat: Oropharynx is clear and moist.  Eyes: Pupils are equal, round, and reactive to light. No scleral icterus.  Neck: Neck supple. Carotid bruit is not present. No thyromegaly present.  Cardiovascular: Normal rate and intact distal pulses.  An irregularly irregular rhythm present. Exam reveals no gallop and no friction rub.   Murmur heard.  Systolic murmur is present with a grade of 1/6  +2 pitting R>LLE edema. No calf TTP. TED stockings intact  Pulmonary/Chest: Effort normal and breath sounds normal. He has no wheezes. He has no rales. He exhibits no tenderness.  Abdominal: Soft. Bowel sounds are normal. He exhibits no distension, no abdominal bruit, no pulsatile midline mass and no mass. There is no tenderness. There is no rebound and no guarding. A hernia is present. Hernia confirmed positive in the ventral area (reducible, NT).    Musculoskeletal: He exhibits edema.  Lymphadenopathy:    He has no cervical adenopathy.  Neurological: He is alert.  Skin: Skin is warm and dry. No rash noted.  Psychiatric: He has a normal mood and affect. His behavior is normal.     Labs reviewed: Admission on 10/31/2015, Discharged on 11/03/2015  Component Date Value Ref Range Status  . WBC 10/31/2015 7.0  4.0 - 10.5 K/uL Final  . RBC 10/31/2015 4.89  4.22 - 5.81 MIL/uL Final  . Hemoglobin 10/31/2015 13.6  13.0 -  17.0 g/dL Final  . HCT 10/31/2015 40.1  39.0 - 52.0 % Final  . MCV 10/31/2015 82.0  78.0 - 100.0 fL Final  . MCH 10/31/2015 27.8  26.0 - 34.0 pg Final  . MCHC 10/31/2015 33.9  30.0 - 36.0 g/dL Final  . RDW 10/31/2015 13.4  11.5 - 15.5 % Final  . Platelets 10/31/2015 191  150 - 400 K/uL Final  . Neutrophils Relative % 10/31/2015 72   Final  . Neutro Abs 10/31/2015 5.1  1.7 - 7.7 K/uL Final  . Lymphocytes Relative 10/31/2015 18   Final  . Lymphs Abs 10/31/2015 1.2  0.7 - 4.0 K/uL Final  . Monocytes Relative 10/31/2015 8   Final  . Monocytes Absolute 10/31/2015 0.6  0.1 - 1.0 K/uL Final  . Eosinophils Relative 10/31/2015 2   Final  . Eosinophils Absolute 10/31/2015 0.1  0.0 - 0.7 K/uL Final  . Basophils Relative 10/31/2015 0   Final  . Basophils Absolute 10/31/2015 0.0  0.0 - 0.1 K/uL Final  . Sodium 10/31/2015 140  135 - 145 mmol/L Final  . Potassium 10/31/2015 4.2  3.5 - 5.1 mmol/L Final  . Chloride 10/31/2015 107  101 - 111 mmol/L Final  . CO2 10/31/2015 23  22 - 32 mmol/L Final  . Glucose, Bld 10/31/2015 94  65 - 99 mg/dL Final  . BUN 10/31/2015 27* 6 - 20 mg/dL Final  . Creatinine, Ser 10/31/2015 1.61* 0.61 - 1.24 mg/dL Final  . Calcium 10/31/2015 9.8  8.9 - 10.3 mg/dL Final  . Total Protein 10/31/2015 8.4* 6.5 - 8.1 g/dL Final  . Albumin 10/31/2015 4.5  3.5 - 5.0 g/dL Final  . AST 10/31/2015 24  15 - 41 U/L Final  . ALT 10/31/2015 17  17 - 63 U/L Final  .  Alkaline Phosphatase 10/31/2015 68  38 - 126 U/L Final  . Total Bilirubin 10/31/2015 0.8  0.3 - 1.2 mg/dL Final  . GFR calc non Af Amer 10/31/2015 41* >60 mL/min Final  . GFR calc Af Amer 10/31/2015 48* >60 mL/min Final   Comment: (NOTE) The eGFR has been calculated using the CKD EPI equation. This calculation has not been validated in all clinical situations. eGFR's persistently <60 mL/min signify possible Chronic Kidney Disease.   . Anion gap 10/31/2015 10  5 - 15 Final  . Alcohol, Ethyl (B) 10/31/2015 <5  <5 mg/dL Final    Comment:        LOWEST DETECTABLE LIMIT FOR SERUM ALCOHOL IS 5 mg/dL FOR MEDICAL PURPOSES ONLY   . Acetaminophen (Tylenol), Serum 10/31/2015 <10* 10 - 30 ug/mL Final   Comment:        THERAPEUTIC CONCENTRATIONS VARY SIGNIFICANTLY. A RANGE OF 10-30 ug/mL MAY BE AN EFFECTIVE CONCENTRATION FOR MANY PATIENTS. HOWEVER, SOME ARE BEST TREATED AT CONCENTRATIONS OUTSIDE THIS RANGE. ACETAMINOPHEN CONCENTRATIONS >150 ug/mL AT 4 HOURS AFTER INGESTION AND >50 ug/mL AT 12 HOURS AFTER INGESTION ARE OFTEN ASSOCIATED WITH TOXIC REACTIONS.   Marland Kitchen Opiates 10/31/2015 NONE DETECTED  NONE DETECTED Final  . Cocaine 10/31/2015 NONE DETECTED  NONE DETECTED Final  . Benzodiazepines 10/31/2015 NONE DETECTED  NONE DETECTED Final  . Amphetamines 10/31/2015 NONE DETECTED  NONE DETECTED Final  . Tetrahydrocannabinol 10/31/2015 NONE DETECTED  NONE DETECTED Final  . Barbiturates 10/31/2015 NONE DETECTED  NONE DETECTED Final   Comment:        DRUG SCREEN FOR MEDICAL PURPOSES ONLY.  IF CONFIRMATION IS NEEDED FOR ANY PURPOSE, NOTIFY LAB WITHIN 5 DAYS.        LOWEST DETECTABLE LIMITS FOR URINE DRUG SCREEN Drug Class       Cutoff (ng/mL) Amphetamine      1000 Barbiturate      200 Benzodiazepine   517 Tricyclics       616 Opiates          300 Cocaine          300 THC              50   . Troponin i, poc 10/31/2015 0.05  0.00 - 0.08 ng/mL Final  . Comment 3 10/31/2015          Final   Comment: Due to the release kinetics of cTnI, a negative result within the first hours of the onset of symptoms does not rule out myocardial infarction with certainty. If myocardial infarction is still suspected, repeat the test at appropriate intervals.   . Color, Urine 10/31/2015 YELLOW  YELLOW Final  . APPearance 10/31/2015 CLEAR  CLEAR Final  . Specific Gravity, Urine 10/31/2015 1.011  1.005 - 1.030 Final  . pH 10/31/2015 5.5  5.0 - 8.0 Final  . Glucose, UA 10/31/2015 NEGATIVE  NEGATIVE mg/dL Final  . Hgb urine  dipstick 10/31/2015 NEGATIVE  NEGATIVE Final  . Bilirubin Urine 10/31/2015 NEGATIVE  NEGATIVE Final  . Ketones, ur 10/31/2015 NEGATIVE  NEGATIVE mg/dL Final  . Protein, ur 10/31/2015 NEGATIVE  NEGATIVE mg/dL Final  . Nitrite 10/31/2015 NEGATIVE  NEGATIVE Final  . Leukocytes, UA 10/31/2015 NEGATIVE  NEGATIVE Final   MICROSCOPIC NOT DONE ON URINES WITH NEGATIVE PROTEIN, BLOOD, LEUKOCYTES, NITRITE, OR GLUCOSE <1000 mg/dL.  Marland Kitchen Sodium 11/02/2015 138  135 - 145 mmol/L Final  . Potassium 11/02/2015 4.3  3.5 - 5.1 mmol/L Final  . Chloride 11/02/2015 105  101 -  111 mmol/L Final  . CO2 11/02/2015 23  22 - 32 mmol/L Final  . Glucose, Bld 11/02/2015 105* 65 - 99 mg/dL Final  . BUN 11/02/2015 36* 6 - 20 mg/dL Final  . Creatinine, Ser 11/02/2015 1.69* 0.61 - 1.24 mg/dL Final  . Calcium 11/02/2015 9.6  8.9 - 10.3 mg/dL Final  . Total Protein 11/02/2015 8.1  6.5 - 8.1 g/dL Final  . Albumin 11/02/2015 4.3  3.5 - 5.0 g/dL Final  . AST 11/02/2015 26  15 - 41 U/L Final  . ALT 11/02/2015 18  17 - 63 U/L Final  . Alkaline Phosphatase 11/02/2015 71  38 - 126 U/L Final  . Total Bilirubin 11/02/2015 1.1  0.3 - 1.2 mg/dL Final  . GFR calc non Af Amer 11/02/2015 39* >60 mL/min Final  . GFR calc Af Amer 11/02/2015 45* >60 mL/min Final   Comment: (NOTE) The eGFR has been calculated using the CKD EPI equation. This calculation has not been validated in all clinical situations. eGFR's persistently <60 mL/min signify possible Chronic Kidney Disease.   . Anion gap 11/02/2015 10  5 - 15 Final  . Sodium 11/02/2015 139  135 - 145 mmol/L Final  . Potassium 11/02/2015 4.3  3.5 - 5.1 mmol/L Final  . Chloride 11/02/2015 105  101 - 111 mmol/L Final  . CO2 11/02/2015 25  22 - 32 mmol/L Final  . Glucose, Bld 11/02/2015 110* 65 - 99 mg/dL Final  . BUN 11/02/2015 27* 6 - 20 mg/dL Final  . Creatinine, Ser 11/02/2015 1.50* 0.61 - 1.24 mg/dL Final  . Calcium 11/02/2015 9.6  8.9 - 10.3 mg/dL Final  . Total Protein 11/02/2015  7.8  6.5 - 8.1 g/dL Final  . Albumin 11/02/2015 4.1  3.5 - 5.0 g/dL Final  . AST 11/02/2015 26  15 - 41 U/L Final  . ALT 11/02/2015 17  17 - 63 U/L Final  . Alkaline Phosphatase 11/02/2015 68  38 - 126 U/L Final  . Total Bilirubin 11/02/2015 0.8  0.3 - 1.2 mg/dL Final  . GFR calc non Af Amer 11/02/2015 45* >60 mL/min Final  . GFR calc Af Amer 11/02/2015 52* >60 mL/min Final   Comment: (NOTE) The eGFR has been calculated using the CKD EPI equation. This calculation has not been validated in all clinical situations. eGFR's persistently <60 mL/min signify possible Chronic Kidney Disease.   . Anion gap 11/02/2015 9  5 - 15 Final    No results found.   Assessment/Plan   ICD-9-CM ICD-10-CM   1. Dementia with behavioral disturbance 294.21 F03.91   2. Depression, major, single episode, severe ([place]) 296.23 F32.2   3. Atrial fibrillation and flutter ([place]) 427.31 I48.91    427.32 I48.92   4. Seizure disorder ([place]) 345.90 G40.909   5. BPH (benign prostatic hyperplasia) 600.00 N40.0   6. Physical deconditioning 799.3 R53.81   7. Hyperlipidemia 272.4 E78.5   8. GERD without esophagitis 530.81 K21.9   9. Bilateral leg edema 782.3 R60.0    R>L  10.     Reducible ventral hernia - observation at this time as he is asymptomatic  Cont current meds as ordered  PT/OT/ST as ordered  Nutritional supplements as indicated  TED stockings as ordered. Keep legs elevated when seated  GOAL: short term rehab and d/c probably to ALF when medically appropriate. Communicated with pt and nursing. His brother, Christean Grief, is attempting to get guardianship of pt  Will follow  Aella Ronda S. Eulas Post, D. O., F. A. C.  [place]  [facility] [place], [place] [address] [phone] Cell (Monday-Friday 8 AM - 5 PM) [phone] After 5 PM and follow prompts

## 2016-02-03 LAB — BASIC METABOLIC PANEL
BUN: 15 mg/dL (ref 4–21)
CREATININE: 1.4 mg/dL — AB (ref 0.6–1.3)
GLUCOSE: 99 mg/dL
POTASSIUM: 4.1 mmol/L (ref 3.4–5.3)
SODIUM: 140 mmol/L (ref 137–147)

## 2016-02-22 ENCOUNTER — Non-Acute Institutional Stay (SKILLED_NURSING_FACILITY): Payer: Medicare Other | Admitting: Adult Health

## 2016-02-22 DIAGNOSIS — J069 Acute upper respiratory infection, unspecified: Secondary | ICD-10-CM | POA: Diagnosis not present

## 2016-02-22 DIAGNOSIS — E785 Hyperlipidemia, unspecified: Secondary | ICD-10-CM | POA: Diagnosis not present

## 2016-02-22 DIAGNOSIS — R634 Abnormal weight loss: Secondary | ICD-10-CM | POA: Diagnosis not present

## 2016-03-03 ENCOUNTER — Encounter: Payer: Self-pay | Admitting: Internal Medicine

## 2016-03-03 ENCOUNTER — Encounter: Payer: Self-pay | Admitting: Adult Health

## 2016-03-03 ENCOUNTER — Non-Acute Institutional Stay (SKILLED_NURSING_FACILITY): Payer: Medicare Other | Admitting: Internal Medicine

## 2016-03-03 DIAGNOSIS — F322 Major depressive disorder, single episode, severe without psychotic features: Secondary | ICD-10-CM

## 2016-03-03 DIAGNOSIS — F03918 Unspecified dementia, unspecified severity, with other behavioral disturbance: Secondary | ICD-10-CM

## 2016-03-03 DIAGNOSIS — I4891 Unspecified atrial fibrillation: Secondary | ICD-10-CM | POA: Diagnosis not present

## 2016-03-03 DIAGNOSIS — R634 Abnormal weight loss: Secondary | ICD-10-CM | POA: Insufficient documentation

## 2016-03-03 DIAGNOSIS — E785 Hyperlipidemia, unspecified: Secondary | ICD-10-CM | POA: Insufficient documentation

## 2016-03-03 DIAGNOSIS — F0391 Unspecified dementia with behavioral disturbance: Secondary | ICD-10-CM | POA: Diagnosis not present

## 2016-03-03 DIAGNOSIS — I10 Essential (primary) hypertension: Secondary | ICD-10-CM | POA: Diagnosis not present

## 2016-03-03 DIAGNOSIS — Z1211 Encounter for screening for malignant neoplasm of colon: Secondary | ICD-10-CM | POA: Diagnosis not present

## 2016-03-03 DIAGNOSIS — G40909 Epilepsy, unspecified, not intractable, without status epilepticus: Secondary | ICD-10-CM

## 2016-03-03 DIAGNOSIS — R931 Abnormal findings on diagnostic imaging of heart and coronary circulation: Secondary | ICD-10-CM | POA: Insufficient documentation

## 2016-03-03 DIAGNOSIS — I4892 Unspecified atrial flutter: Secondary | ICD-10-CM | POA: Diagnosis not present

## 2016-03-03 DIAGNOSIS — J069 Acute upper respiratory infection, unspecified: Secondary | ICD-10-CM | POA: Insufficient documentation

## 2016-03-03 NOTE — Progress Notes (Signed)
Location:   [facility]    Place of Service:  SNF (31)    Allergies  Allergen Reactions  . Codeine Rash    Chief Complaint  Patient presents with  . Acute Visit    cough     HPI:  He has had a congested cough for the past several days. Staff also reports that he is losing weight. His weigh tin April was 164 pounds with his current weight of 140.6 pounds. Staff reports that his appetite has been poor recently. He is unable to fully part participate in the hpi or ros; but told me that he did not feel good.   Past Medical History  Diagnosis Date  . Hypertension   . BPH (benign prostatic hyperplasia)   . Syncope and collapse 08/07/2012    "w/loss of consciousness; found down; don't know how long he was out" (08/07/2012)  . H/O hiatal hernia   . Seizures ([facility])     "h/o gran mal & petit" (08/07/2012)  . Stroke [facility]) 1980's    "mini w/seizure at the same time" (08/07/2012)  . Coronary artery disease   . LBBB (left bundle branch block)   . Dementia with behavioral disturbance 11/01/2015  . Rib fracture 09/18/2012  . Suicidal ideation   . Glaucoma   . A-fib [facility])     Past Surgical History  Procedure Laterality Date  . Coronary artery bypass graft  ~ 2010    CABG X1  . Tibia fracture surgery  1960's    RLE    Social History   Social History  . Marital Status: Legally Separated    Spouse Name: N/A  . Number of Children: N/A  . Years of Education: N/A   Occupational History  . Not on file.   Social History Main Topics  . Smoking status: Never Smoker   . Smokeless tobacco: Never Used  . Alcohol Use: No  . Drug Use: No  . Sexual Activity: Not on file   Other Topics Concern  . Not on file   Social History Narrative   Family History  Problem Relation Age of Onset  . Heart disease      No family history      VITAL SIGNS BP 111/65 mmHg  Pulse 62  Temp(Src) 97.8 F (36.6 C)  Ht  (1.778 m)  Wt 140 lb 9.6 oz (63.776 kg)  BMI 20.17 kg/m2  SpO2  93%  Patient's Medications  New Prescriptions   No medications on file  Previous Medications   ALBUTEROL (PROVENTIL) (2.5 MG/3ML) 0.083% NEBULIZER SOLUTION    Take 2.5 mg by nebulization every 6 (six) hours as needed for wheezing or shortness of breath.   ASPIRIN 325 MG TABLET    Take 325 mg by mouth daily.   BRIMONIDINE (ALPHAGAN) 0.2 % OPHTHALMIC SOLUTION    Place into both eyes 2 (two) times daily. Reported on 01/05/2016   ESCITALOPRAM (LEXAPRO) 10 MG TABLET    Take 10 mg by mouth daily.    FERROUS SULFATE 325 (65 FE) MG TABLET    Take 325 mg by mouth 3 (three) times daily with meals.   FINASTERIDE (PROSCAR) 5 MG TABLET    Take 5 mg by mouth daily.   FUROSEMIDE (LASIX) 40 MG TABLET    Take 40 mg by mouth daily.   FUROSEMIDE (LASIX) 40 MG TABLET    Take 40 mg by mouth 2 (two) times daily.   LAMOTRIGINE (LAMICTAL) 200 MG TABLET  Take 200 mg by mouth 2 (two) times daily.   LATANOPROST (XALATAN) 0.005 % OPHTHALMIC SOLUTION    Place 1 drop into both eyes at bedtime.   LEVETIRACETAM (KEPPRA) 750 MG TABLET    Take 1,500 mg by mouth 2 (two) times daily.   METOPROLOL SUCCINATE (TOPROL-XL) 100 MG 24 HR TABLET    Take 100 mg by mouth daily. Take with or immediately following a meal.   METOPROLOL SUCCINATE (TOPROL-XL) 50 MG 24 HR TABLET    Take 50 mg by mouth daily. Take with or immediately following a meal.   MULTIPLE VITAMIN (MULTIVITAMIN WITH MINERALS) TABS    Take 1 tablet by mouth daily.   crestor 20 mg  Take 20 mg daily    OMEPRAZOLE (PRILOSEC) 20 MG CAPSULE    Take 20 mg by mouth daily.  Modified Medications   No medications on file  Discontinued Medications   No medications on file     SIGNIFICANT DIAGNOSTIC EXAMS   12-24-15: TEE: mild to moderate bi-atrial enlargement. EF is 45%;Marland Kitchen. The left ventricle is regionally impaired (septal/inferior hypo contractility). The IVC is normal. Compared to prior echo report 03-16-12; changes are noted; wall motion and atrial abnormalities are  new.  12-24-15: EEG: diffuse slowing. This EEG was consistent with a nonspecific diffuse cerebral disturbance such as an encephalopathy   12-24-15: ct of head: no calvarial fracture or acute intracranial hemorrhage. redemonstration of encephalomalacia of the right cerebral hemisphere, especially involving the right temporal lobe. The pattern is unchanged. There is cerebral arteriosclerosis.   12-24-15: ct of abdomen and pelvis: trace bilateral pleural effusion with associated consolidations may represent atelectasis, scarring or aspiration. Cannot exclude mild pulmonary edema. Multiple bilateral rib deformities suggestive of fractures, may be old. New mild hydroureteronephrosis.   12-27-15: chest x-ray: most likely compatible with pulmonary edema. Small bilateral pleural effusion. Likely atelectasis of the lung bases.   01-12-16: ct angio of head and neck: 1. No intracranial occulusion, aneurysm or high-grade stenosis.  2. Mild atherosclerotic disease of the carotid bifurcations without hemodynamically significant stenosis.  3. 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 Left PICA likely fills via retrograde opacification from the basilar artery. Vertebrobasilar system otherwise widely patent.  4. Multiple enlarged upper mediastinal lymph nodes and ground glass apical opacities.,this could be further evaluated with dedicated chest ct if clinically warranted. These are enlarged from prior chest ct of 2013.   01-12-16 mri of brain; 1. No acute infarct. Chronic right MCA territory infarct again demonstrated.  2. Chronic microvascular ischemic changes are moderately advanced for age and slightly progressed from 2009.  3. Unremarkable examination of the cervical carotid arteries and intracranial anterior circulation, though these structures are better evaluated on same CTA.  4. Non-dominant left vertebral artery is not well visualized, though images are degraded by  motion. Occlusion of portions of left V3 and V4 vertebral artery segments with retrograde filling of the left PICA is better demonstrated on same day CTA.   01-12-16: EEG: normal awake and drowsy EEG     LABS REVIEWED:   01-06-16: wbc 9.1; hgb 8.6; hct 27.7; mcv 77.4; ;plt 299; glucose 81; bun 18; creat 1.12; k+ 4.1; na++141; liver normal albumin 3.5 chol 102; ldl 52; trig 40; hdl 42; iron 23; CK 77; BNP 299.2    Review of Systems  Unable to perform ROS: dementia      Physical Exam  Constitutional: He is oriented to person, place, and time.  No distress.  Eyes: Conjunctivae are normal.  Neck: Neck supple. No JVD present. No thyromegaly present.  Cardiovascular: Normal rate, regular rhythm and intact distal pulses.   Respiratory: Effort normal  No respiratory distress. He has no wheezes. breath sounds diminished GI: Soft. Bowel sounds are normal. He exhibits no distension. There is no tenderness.  Musculoskeletal: 3+ bilateral lower extremity edema   Able to move all extremities   Lymphadenopathy:    He has no cervical adenopathy.  Neurological: He is alert .  Skin: Skin is warm and dry. He is not diaphoretic.  Psychiatric: He has a normal mood and affect.      ASSESSMENT/ PLAN:  1. Weight loss 2. Dyslipidemia 3. Upper respiratory failure  Will stop the crestor due to weight loss Will begin a zpack Will begin mucinex twice daily for one week and will monitor    Synthia Innocenteborah Green NP [facility]  Contact [phone] Monday through Friday 8am- 5pm  After hours call [phone]

## 2016-03-03 NOTE — Progress Notes (Signed)
DATE: 03/03/16  Location:  Nursing Home Location[facility] Room Number: 154 B Place of Service: SNF (31)   Extended Emergency Contact Information Primary Emergency Contact: [name],[name] Home Phone: [phone] Mobile Phone: [phone] Relation: Brother Secondary Emergency Contact: [name],[name] Mobile Phone: [phone] Relation: Relative  Advanced Directive information Does patient have an advance directive?: No, Would patient like information on creating an advanced directive?: No - patient declined information FULL CODE Chief Complaint  Patient presents with  . Medical Management of Chronic Issues    Routine Visit    HPI:  70 yo male long term resident seen today for f/u. He continues to lose weight and has lost 24lbs since last month unintentionally.   He states he is ready to go home. PT reports he is ambulating well with frequent cues. No falls. Appetite ok. Sleeping well. No falls. He is a poor historian due to dementia. Hx obtained from chart. His brother, [name], is attempting to get guardianship as pt is not safe to live alone due to dementia.  Afib/aflutter - rate controlled on toprol XL. Takes asa 325 mg daily   Iron deficiency anemia - stable on  iron three times daily   Left ventricular dysfunction/BLE edema [facility]  [facility]. [facility] showed nml EF but septal motion c/w conduction abnormality; L>R dilated atrium; mild MR. He has not seen CHF clinic yet. Takes lasix 40 mg BID   GERD - stable on prilosec 20 mg daily  BPH - stable on proscar 5 mg daily  Major depression, severe - takes lexapro 10 mg daily; he has a history of suicidal ideation in Feb 2017.   Hypertension - BP stable on lopressor 25 mg every 6 hours   Dyslipidemia - crestor stopped due to weight lossy  Vascular dementia - functional status  has declined per family; his namenda was stopped while in the hospital. He continues to lose weight unintentionally  Seizures - none witnessed. Takes lamctial 200 mg twice daily and keppra 750 mg twice daily   Glaucoma - stable on alphagan to both eyes twice daily and  xalatan to both eyes nightly   History of CVA -  stable on asa 325 mg daily. He rec'd tPA on 01-12-16  CAD is status post cabg X 1 - stable overall. He does have periodic episodes of shortness of breath. Takes asa 325 mg daily    Past Medical History  Diagnosis Date  . Hypertension   . BPH (benign prostatic hyperplasia)   . Syncope and collapse 08/07/2012    "w/loss of consciousness; found down; don't know how long he was out" (08/07/2012)  . H/O hiatal hernia   . Seizures (HCC)     "h/o gran mal & petit" (08/07/2012)  . Stroke [facility](HCC) 1980's    "mini w/seizure at the same time" (08/07/2012)  . Coronary artery disease   . LBBB (left bundle branch block)   . Dementia with behavioral disturbance 11/01/2015  . Rib fracture 09/18/2012  . Suicidal ideation   . Glaucoma   . A-fib [facility](HCC)     Past Surgical History  Procedure Laterality Date  . Coronary artery bypass graft  ~ 2010    CABG X1  . Tibia fracture surgery  1960's    RLE    Patient Care Team[facility] - General (General Practice)  Social History   Social History  . Marital Status: Legally Separated    Spouse Name: N/A  . Number of Children: N/A  . Years of Education: N/A   Occupational History  . Not on file.   Social History Main Topics  . Smoking status: Never Smoker   . Smokeless tobacco: Never Used  . Alcohol Use: No  . Drug Use: No  . Sexual Activity: Not on file   Other Topics Concern  . Not on file   Social History Narrative     reports that he has never smoked. He has never used smokeless tobacco. He reports that he does not drink alcohol or use illicit drugs.  Family History  Problem Relation Age of Onset  .  Heart disease      No family history   No family status information on file.    Immunization History  Administered Date(s) Administered  . Influenza-Unspecified 06/07/2012  . PPD Test 01/22/2016    Allergies  Allergen Reactions  . Codeine Rash    Medications: Patient's Medications  New Prescriptions   No medications on file  Previous Medications   ALBUTEROL (PROVENTIL) (2.5 MG/3ML) 0.083% NEBULIZER SOLUTION    Take 2.5 mg by nebulization every 6 (six) hours as needed for wheezing or shortness of breath.   ASPIRIN 325 MG TABLET    Take 325 mg by mouth daily.   BRIMONIDINE (ALPHAGAN) 0.2 % OPHTHALMIC SOLUTION    Place into both eyes 2 (two) times daily. Reported on 01/05/2016   ESCITALOPRAM (LEXAPRO) 10 MG TABLET    Take 10 mg by mouth daily.    FERROUS SULFATE 325 (65 FE) MG TABLET    Take 325 mg by mouth 3 (three) times daily with meals.   FINASTERIDE (PROSCAR) 5 MG TABLET    Take 5 mg by mouth daily.   FUROSEMIDE (LASIX) 40 MG TABLET    Take 40 mg by mouth daily.   FUROSEMIDE (LASIX) 40 MG TABLET    Take 40 mg by mouth 2 (two) times daily.   LAMOTRIGINE (LAMICTAL) 200 MG TABLET    Take 200 mg by mouth 2 (two) times daily.   LATANOPROST (XALATAN) 0.005 % OPHTHALMIC SOLUTION    Place 1 drop into both eyes at bedtime.   LEVETIRACETAM (KEPPRA) 750 MG TABLET    Take 1,500 mg by mouth 2 (two) times daily.   METOPROLOL SUCCINATE (TOPROL-XL) 100 MG 24 HR TABLET    Take 100 mg by mouth daily. Take with or immediately following a meal.   METOPROLOL SUCCINATE (TOPROL-XL) 50 MG 24 HR TABLET    Take 50 mg by mouth daily. Take with or immediately following a meal.   MULTIPLE VITAMIN (MULTIVITAMIN WITH MINERALS) TABS    Take 1 tablet by mouth daily.   OMEPRAZOLE (PRILOSEC) 20 MG CAPSULE    Take 20 mg by mouth daily.  Modified Medications   No medications on file  Discontinued Medications   ROSUVASTATIN (CRESTOR) 20 MG TABLET    Take 20 mg by mouth daily. Reported on 03/03/2016    Review of  Systems  Unable to perform ROS: Dementia    Filed Vitals:   03/03/16 0957  BP: 110/60  Pulse: 82  Temp: 97.7 F (36.5 C)  TempSrc: Oral  Resp: 18  Height: 5\' 10"  (1.778 m)  Weight: 137 lb 3.2 oz (62.234 kg)  SpO2: 98%   Body mass index is 19.69  kg/(m^2).  Physical Exam  Constitutional: He appears well-developed.  Frail appearing sitting on bed in NAD.  HENT:  Mouth/Throat: Oropharynx is clear and moist.  Eyes: Pupils are equal, round, and reactive to light. No scleral icterus.  Neck: Neck supple. Carotid bruit is not present. No thyromegaly present.  Cardiovascular: Normal rate and intact distal pulses.  An irregularly irregular rhythm present. Exam reveals no gallop and no friction rub.   Murmur heard.  Systolic murmur is present with a grade of 1/6  +1 pitting RLE edema. No LLE edema. No calf TTP. TED stocking intact on RLE  Pulmonary/Chest: Effort normal and breath sounds normal. He has no wheezes. He has no rales. He exhibits no tenderness.  Abdominal: Soft. Bowel sounds are normal. He exhibits no distension, no abdominal bruit, no pulsatile midline mass and no mass. There is no tenderness. There is no rebound and no guarding. A hernia is present. Hernia confirmed positive in the ventral area (reducible, NT).    Musculoskeletal: He exhibits edema.  Lymphadenopathy:    He has no cervical adenopathy.  Neurological: He is alert.  Skin: Skin is warm and dry. No rash noted.  Psychiatric: He has a normal mood and affect. His behavior is normal.     Labs reviewed: Nursing Home on 03/03/2016  Component Date Value Ref Range Status  . Potassium 02/03/2016 4.1  3.4 - 5.3 mmol/L Final  . Sodium 02/03/2016 140  137 - 147 mmol/L Final  . Glucose 02/03/2016 99   Final  . BUN 02/03/2016 15  4 - 21 mg/dL Final  . Creatinine 16/06/9603 1.4* 0.6 - 1.3 mg/dL Final    No results found.   Assessment/Plan   ICD-9-CM ICD-10-CM   1. Weight loss, non-intentional - worsening 783.21  R63.4   2. Dementia with behavioral disturbance 294.21 F03.91   3. Depression, major, single episode, severe (HCC) 296.23 F32.2   4. Atrial fibrillation and flutter (HCC) 427.31 I48.91    427.32 I48.92   5. Seizure disorder (HCC) 345.90 G40.909   6. Hyperlipidemia 272.4 E78.5   7. Essential hypertension 401.9 I10   8. Abnormal echocardiogram 793.2 R93.1     nml EF but septal motion c/w conduction abnormality; L>R dilated atrium; mild MR in March 2017  9. Colon cancer screening V76.51 Z12.11     T/c palliative care due to continued weight loss. Continue nutritional supplements as ordered  Cont current meds as ordered  PT/OT/ST as ordered  Guaic stools x 3 for colon cancer screening  Will follow  [name]  [facility] [address] [place], [place] [address] [phone] Cell (Monday-Friday 8 AM - 5 PM) [phone] After 5 PM and follow prompts

## 2016-04-06 ENCOUNTER — Non-Acute Institutional Stay (SKILLED_NURSING_FACILITY): Payer: Medicare Other | Admitting: Adult Health

## 2016-04-06 ENCOUNTER — Encounter: Payer: Self-pay | Admitting: Adult Health

## 2016-04-06 DIAGNOSIS — I519 Heart disease, unspecified: Secondary | ICD-10-CM

## 2016-04-06 DIAGNOSIS — G40909 Epilepsy, unspecified, not intractable, without status epilepticus: Secondary | ICD-10-CM

## 2016-04-06 DIAGNOSIS — I110 Hypertensive heart disease with heart failure: Secondary | ICD-10-CM

## 2016-04-06 NOTE — Progress Notes (Signed)
Patient ID: [name], male   DOB: November 06, 1945, 70 y.o.   MRN: [id number]   Location:   [facility] Room Number: 154-B Place of Service:  SNF (31)    CODE STATUS: Full Code  Allergies  Allergen Reactions  . Codeine Rash    Chief Complaint  Patient presents with  . Discharge Note    Discharge from facilty    HPI:  He is being discharged to [facility]. He will need home health for pt/ot. He will need a standard wheelchair. He will need his prescriptions to be written. He will follow up with the medical provider at the receiving facility.   Past Medical History:  Diagnosis Date  . A-fib (HCC)   . BPH (benign prostatic hyperplasia)   . Coronary artery disease   . Dementia with behavioral disturbance 11/01/2015  . Glaucoma   . H/O hiatal hernia   . Hypertension   . LBBB (left bundle branch block)   . Rib fracture 09/18/2012  . Seizures (HCC)    "h/o gran mal & petit" (08/07/2012)  . Stroke [facility] 1980's   "mini w/seizure at the same time" (08/07/2012)  . Suicidal ideation   . Syncope and collapse 08/07/2012   "w/loss of consciousness; found down; don't know how long he was out" (08/07/2012)    Past Surgical History:  Procedure Laterality Date  . CORONARY ARTERY BYPASS GRAFT  ~ 2010   CABG X1  . TIBIA FRACTURE SURGERY  1960's   RLE    Social History   Social History  . Marital status: Legally Separated    Spouse name: N/A  . Number of children: N/A  . Years of education: N/A   Occupational History  . Not on file.   Social History Main Topics  . Smoking status: Never Smoker  . Smokeless tobacco: Never Used  . Alcohol use No  . Drug use: No  . Sexual activity: Not on file   Other Topics Concern  . Not on file   Social History Narrative  . No narrative on file   Family History  Problem Relation Age of Onset  . Heart disease      No family history    VITAL SIGNS BP 113/64   Pulse 98   Temp 97.5 F (36.4 C) (Oral)    Resp 18   Ht 5\' 10"  (1.778 m)   Wt 136 lb (61.7 kg)   SpO2 98%   BMI 19.51 kg/m   Patient's Medications  New Prescriptions   No medications on file  Previous Medications   ASPIRIN 325 MG TABLET    Take 325 mg by mouth daily.   BRIMONIDINE (ALPHAGAN) 0.2 % OPHTHALMIC SOLUTION    Place into both eyes 2 (two) times daily. Reported on 01/05/2016   ESCITALOPRAM (LEXAPRO) 10 MG TABLET    Take 10 mg by mouth daily.    FERROUS SULFATE 325 (65 FE) MG TABLET    Take 325 mg by mouth 3 (three) times daily with meals.   FINASTERIDE (PROSCAR) 5 MG TABLET    Take 5 mg by mouth daily.   FUROSEMIDE (LASIX) 40 MG TABLET    Take 40 mg by mouth 2 (two) times daily.   LAMOTRIGINE (LAMICTAL) 200 MG TABLET    Take 200 mg by mouth 2 (two) times daily.   LATANOPROST (XALATAN) 0.005 % OPHTHALMIC SOLUTION    Place 1 drop into both eyes at bedtime.   LEVETIRACETAM (KEPPRA) 750 MG  TABLET    Take 1,500 mg by mouth 2 (two) times daily.   METOPROLOL SUCCINATE (TOPROL-XL) 100 MG 24 HR TABLET    Take 100 mg by mouth daily. Take with or immediately following a meal.   METOPROLOL SUCCINATE (TOPROL-XL) 50 MG 24 HR TABLET    Take 50 mg by mouth daily. Take with or immediately following a meal.   MULTIPLE VITAMIN (MULTIVITAMIN WITH MINERALS) TABS    Take 1 tablet by mouth daily.   NEOMY-BACIT-POLYMYX-PRAMOXINE 1 % OINT    Apply topically daily.   OMEPRAZOLE (PRILOSEC) 20 MG CAPSULE    Take 20 mg by mouth daily.  Modified Medications   No medications on file  Discontinued Medications   No medications on file     SIGNIFICANT DIAGNOSTIC EXAMS  12-24-15: TEE: mild to moderate bi-atrial enlargement. EF is 45%;Marland Kitchen The left ventricle is regionally impaired (septal/inferior hypo contractility). The IVC is normal. Compared to prior echo report 03-16-12; changes are noted; wall motion and atrial abnormalities are new.  12-24-15: EEG: diffuse slowing. This EEG was consistent with a nonspecific diffuse cerebral disturbance such as an  encephalopathy   12-24-15: ct of head: no calvarial fracture or acute intracranial hemorrhage. redemonstration of encephalomalacia of the right cerebral hemisphere, especially involving the right temporal lobe. The pattern is unchanged. There is cerebral arteriosclerosis.   12-24-15: ct of abdomen and pelvis: trace bilateral pleural effusion with associated consolidations may represent atelectasis, scarring or aspiration. Cannot exclude mild pulmonary edema. Multiple bilateral rib deformities suggestive of fractures, may be old. New mild hydroureteronephrosis.   12-27-15: chest x-ray: most likely compatible with pulmonary edema. Small bilateral pleural effusion. Likely atelectasis of the lung bases.   01-12-16: ct angio of head and neck: 1. No intracranial occulusion, aneurysm or high-grade stenosis.  2. Mild atherosclerotic disease of the carotid bifurcations without hemodynamically significant stenosis.  3. 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 Left PICA likely fills via retrograde opacification from the basilar artery. Vertebrobasilar system otherwise widely patent.  4. Multiple enlarged upper mediastinal lymph nodes and ground glass apical opacities.,this could be further evaluated with dedicated chest ct if clinically warranted. These are enlarged from prior chest ct of 2013.   01-12-16 mri of brain; 1. No acute infarct. Chronic right MCA territory infarct again demonstrated.  2. Chronic microvascular ischemic changes are moderately advanced for age and slightly progressed from 2009.  3. Unremarkable examination of the cervical carotid arteries and intracranial anterior circulation, though these structures are better evaluated on same CTA.  4. Non-dominant left vertebral artery is not well visualized, though images are degraded by motion. Occlusion of portions of left V3 and V4 vertebral artery segments with retrograde filling of the left PICA is better  demonstrated on same day CTA.   01-12-16: EEG: normal awake and drowsy EEG     LABS REVIEWED:   01-06-16: wbc 9.1; hgb 8.6; hct 27.7; mcv 77.4; ;plt 299; glucose 81; bun 18; creat 1.12; k+ 4.1; na++141; liver normal albumin 3.5 chol 102; ldl 52; trig 40; hdl 42; iron 23; CK 77; BNP 299.2    Review of Systems  Unable to perform ROS: dementia      Physical Exam  Constitutional: He is oriented to person, place, and time. No distress.  Eyes: Conjunctivae are normal.  Neck: Neck supple. No JVD present. No thyromegaly present.  Cardiovascular: Normal rate, regular rhythm and intact distal pulses.   Respiratory: Effort normal  No  respiratory distress. He has no wheezes. breath sounds diminished GI: Soft. Bowel sounds are normal. He exhibits no distension. There is no tenderness.  Musculoskeletal: 3+ bilateral lower extremity edema   Able to move all extremities   Lymphadenopathy:    He has no cervical adenopathy.  Neurological: He is alert .  Skin: Skin is warm and dry. He is not diaphoretic.  Psychiatric: He has a normal mood and affect.      ASSESSMENT/ PLAN:  Patient is being discharged with the following home health services:  Pt/ot evaluate and treat as indicated for gait; balance; strength; adl training  Patient is being discharged with the following durable medical equipment:  Standard wheelchair with cushion; anti-tippers; brake extensions in order to allow him to maintain his current level of independence with his adl's which cannot be achieved with a walker; he can self propel   Patient has been advised to f/u with their PCP in 1-2 weeks to bring them up to date on their rehab stay.  Social services at facility was responsible for arranging this appointment.  Pt was provided with a 30 day supply of prescriptions for medications and refills must be obtained from their PCP.  For controlled substances, a more limited supply may be provided adequate until PCP appointment  only.   Time spent with patient  45   minutes >50% time spent counseling; reviewing medical record; tests; labs; and developing future plan of care   Synthia Innocent NP [facility]  Contact [phone] Monday through Friday 8am- 5pm  After hours call [phone]

## 2016-08-29 ENCOUNTER — Emergency Department (HOSPITAL_COMMUNITY): Payer: Medicare Other

## 2016-08-29 ENCOUNTER — Encounter (HOSPITAL_COMMUNITY): Payer: Self-pay | Admitting: Emergency Medicine

## 2016-08-29 ENCOUNTER — Emergency Department (HOSPITAL_COMMUNITY)
Admission: EM | Admit: 2016-08-29 | Discharge: 2016-08-30 | Disposition: A | Discharge status: Home / Self Care | Payer: Medicare Other | Attending: Emergency Medicine | Admitting: Emergency Medicine

## 2016-08-29 DIAGNOSIS — Z8673 Personal history of transient ischemic attack (TIA), and cerebral infarction without residual deficits: Secondary | ICD-10-CM | POA: Diagnosis not present

## 2016-08-29 DIAGNOSIS — Z951 Presence of aortocoronary bypass graft: Secondary | ICD-10-CM | POA: Diagnosis not present

## 2016-08-29 DIAGNOSIS — S32474A Nondisplaced fracture of medial wall of right acetabulum, initial encounter for closed fracture: Secondary | ICD-10-CM | POA: Diagnosis not present

## 2016-08-29 DIAGNOSIS — R51 Headache: Secondary | ICD-10-CM | POA: Insufficient documentation

## 2016-08-29 DIAGNOSIS — Z7982 Long term (current) use of aspirin: Secondary | ICD-10-CM | POA: Insufficient documentation

## 2016-08-29 DIAGNOSIS — S32501A Unspecified fracture of right pubis, initial encounter for closed fracture: Secondary | ICD-10-CM | POA: Insufficient documentation

## 2016-08-29 DIAGNOSIS — S79911A Unspecified injury of right hip, initial encounter: Secondary | ICD-10-CM | POA: Diagnosis present

## 2016-08-29 DIAGNOSIS — S32591A Other specified fracture of right pubis, initial encounter for closed fracture: Secondary | ICD-10-CM

## 2016-08-29 DIAGNOSIS — Y92009 Unspecified place in unspecified non-institutional (private) residence as the place of occurrence of the external cause: Secondary | ICD-10-CM | POA: Insufficient documentation

## 2016-08-29 DIAGNOSIS — W19XXXA Unspecified fall, initial encounter: Secondary | ICD-10-CM

## 2016-08-29 DIAGNOSIS — Y9301 Activity, walking, marching and hiking: Secondary | ICD-10-CM | POA: Insufficient documentation

## 2016-08-29 DIAGNOSIS — W010XXA Fall on same level from slipping, tripping and stumbling without subsequent striking against object, initial encounter: Secondary | ICD-10-CM | POA: Insufficient documentation

## 2016-08-29 DIAGNOSIS — I2510 Atherosclerotic heart disease of native coronary artery without angina pectoris: Secondary | ICD-10-CM | POA: Diagnosis not present

## 2016-08-29 DIAGNOSIS — S32401A Unspecified fracture of right acetabulum, initial encounter for closed fracture: Secondary | ICD-10-CM

## 2016-08-29 DIAGNOSIS — Y999 Unspecified external cause status: Secondary | ICD-10-CM | POA: Insufficient documentation

## 2016-08-29 DIAGNOSIS — Z7901 Long term (current) use of anticoagulants: Secondary | ICD-10-CM | POA: Insufficient documentation

## 2016-08-29 MED ORDER — TRAMADOL HCL 50 MG PO TABS
50.0000 mg | ORAL_TABLET | Freq: Once | ORAL | Status: AC
  Administered 2016-08-29: 50 mg via ORAL
  Filled 2016-08-29: qty 1

## 2016-08-29 MED ORDER — FENTANYL CITRATE (PF) 100 MCG/2ML IJ SOLN: 50.0000 ug | Freq: Once | INTRAMUSCULAR | Status: DC

## 2016-08-29 NOTE — ED Provider Notes (Signed)
[facility]-EMERGENCY DEPT Provider Note   CSN: [id number] Arrival date & time: 08/29/16  2018     History   Chief Complaint Chief Complaint  Patient presents with  . Fall  . Hip Injury    HPI [name] is a 70 y.o. male.  Patient is a 70 year old male who presents with right hip pain after a fall. He states that he was at his niece's house and was walking down the front steps when he tripped and fell onto his right side. She normally uses a walker but felt that he can make it without his walker. He complains of pain to his right hip which is been constant since the fall happened just prior to arrival. He does note they did hit his head and complains of a headache as well as neck pain. There is no loss of consciousness. He denies any other injuries from the fall.  Of note, he is on Eliquis.      Past Medical History:  Diagnosis Date  . A-fib (HCC)   . BPH (benign prostatic hyperplasia)   . Coronary artery disease   . Dementia with behavioral disturbance 11/01/2015  . Glaucoma   . H/O hiatal hernia   . Hypertension   . LBBB (left bundle branch block)   . Rib fracture 09/18/2012  . Seizures (HCC)    "h/o gran mal & petit" (08/07/2012)  . Stroke [facility](HCC) 1980's   "mini w/seizure at the same time" (08/07/2012)  . Suicidal ideation   . Syncope and collapse 08/07/2012   "w/loss of consciousness; found down; don't know how long he was out" (08/07/2012)    Patient Active Problem List   Diagnosis Date Noted  . Weight loss, non-intentional 03/03/2016  . Acute upper respiratory infection 03/03/2016  . Essential hypertension 03/03/2016  . Hyperlipidemia 03/03/2016  . Abnormal echocardiogram 03/03/2016  . Ventral hernia without obstruction or gangrene 01/26/2016  . Hypertensive heart disease 12/31/2015  . Atrial fibrillation and flutter (HCC) 12/31/2015  . Left ventricular dysfunction 12/31/2015  . CAD (coronary artery disease) 12/31/2015  . Anemia, iron deficiency 12/31/2015  .  GERD without esophagitis 12/31/2015  . Dyslipidemia 12/31/2015  . H/O: glaucoma 11/04/2015  . H/O coronary artery bypass surgery 11/04/2015  . Major depressive disorder 11/03/2015  . Dementia with behavioral disturbance 11/01/2015  . Depression, major, single episode, severe (HCC) 11/01/2015  . Seizure disorder (HCC) 08/09/2012  . BPH (benign prostatic hyperplasia)     Past Surgical History:  Procedure Laterality Date  . CORONARY ARTERY BYPASS GRAFT  ~ 2010   CABG X1  . TIBIA FRACTURE SURGERY  1960's   RLE       Home Medications    Prior to Admission medications   Medication Sig Start Date End Date Taking? Authorizing Provider  apixaban (ELIQUIS) 5 MG TABS tablet Take 5 mg by mouth 2 (two) times daily.   Yes Historical Provider, MD  aspirin 325 MG tablet Take 325 mg by mouth daily.   Yes Historical Provider, MD  brimonidine (ALPHAGAN) 0.2 % ophthalmic solution Place into both eyes 2 (two) times daily. Reported on 01/05/2016   Yes Historical Provider, MD  escitalopram (LEXAPRO) 10 MG tablet Take 10 mg by mouth daily.  11/17/15  Yes Historical Provider, MD  ferrous sulfate 325 (65 FE) MG tablet Take 325 mg by mouth 3 (three) times daily with meals.   Yes Historical Provider, MD  finasteride (PROSCAR) 5 MG tablet Take 5 mg by mouth daily.   Yes  Historical Provider, MD  furosemide (LASIX) 40 MG tablet Take 40 mg by mouth 2 (two) times daily.   Yes Historical Provider, MD  lamoTRIgine (LAMICTAL) 200 MG tablet Take 200 mg by mouth 2 (two) times daily.   Yes Historical Provider, MD  latanoprost (XALATAN) 0.005 % ophthalmic solution Place 1 drop into both eyes at bedtime.   Yes Historical Provider, MD  levETIRAcetam (KEPPRA) 500 MG tablet Take 500 mg by mouth 2 (two) times daily.   Yes Historical Provider, MD  LORazepam (ATIVAN) 0.5 MG tablet Take 0.5 mg by mouth every 12 (twelve) hours as needed for anxiety (agitation).   Yes Historical Provider, MD  memantine (NAMENDA) 5 MG tablet Take 5  mg by mouth 2 (two) times daily.   Yes Historical Provider, MD  metoprolol succinate (TOPROL-XL) 100 MG 24 hr tablet Take 100 mg by mouth daily. Take with or immediately following a meal.   Yes Historical Provider, MD  metoprolol succinate (TOPROL-XL) 50 MG 24 hr tablet Take 50 mg by mouth daily. Take with or immediately following a meal.   Yes Historical Provider, MD  Multiple Vitamin (MULTIVITAMIN WITH MINERALS) TABS Take 1 tablet by mouth daily.   Yes Historical Provider, MD  Neomy-Bacit-Polymyx-Pramoxine 1 % OINT Apply 1 application topically daily.    Yes Historical Provider, MD  Nutritional Supplements (NUTRITIONAL SHAKE PO) Take 1 Can by mouth 2 (two) times daily. Health Shake   Yes Historical Provider, MD  traMADol (ULTRAM) 50 MG tablet Take 1 tablet (50 mg total) by mouth every 6 (six) hours as needed. 08/30/16   Rolan BuccoMelanie Kalisha Keadle, MD    Family History Family History  Problem Relation Age of Onset  . Heart disease      No family history    Social History Social History  Substance Use Topics  . Smoking status: Never Smoker  . Smokeless tobacco: Never Used  . Alcohol use No     Allergies   Codeine   Review of Systems Review of Systems  Constitutional: Negative for activity change, appetite change and fever.  HENT: Negative for dental problem, nosebleeds and trouble swallowing.   Eyes: Negative for pain and visual disturbance.  Respiratory: Negative.  Negative for shortness of breath.   Cardiovascular: Negative.  Negative for chest pain.  Gastrointestinal: Negative for abdominal pain, nausea and vomiting.  Genitourinary: Negative for dysuria and hematuria.  Musculoskeletal: Positive for arthralgias and neck pain. Negative for back pain and joint swelling.  Skin: Negative for wound.  Neurological: Positive for headaches. Negative for weakness and numbness.  Psychiatric/Behavioral: Negative for confusion.     Physical Exam Updated Vital Signs BP 109/75 (BP Location:  Left Arm)   Pulse 66   Temp 98.7 F (37.1 C) (Oral)   Resp 22   Ht 5\' 7"  (1.702 m)   Wt 170 lb (77.1 kg)   SpO2 96%   BMI 26.63 kg/m   Physical Exam  Constitutional: He is oriented to person, place, and time. He appears well-developed and well-nourished.  HENT:  Head: Normocephalic and atraumatic.  Nose: Nose normal.  No hemotympanum  Eyes: Conjunctivae are normal. Pupils are equal, round, and reactive to light.  Neck:  +pain to mid cervical spine, no pain to the thoracic, or LS spine.  No step-offs or deformities noted  Cardiovascular: Normal rate and regular rhythm.   No murmur heard. No evidence of external trauma to the chest or abdomen  Pulmonary/Chest: Effort normal and breath sounds normal. No respiratory distress. He  has no wheezes. He exhibits no tenderness.  Abdominal: Soft. Bowel sounds are normal. He exhibits no distension. There is no tenderness.  Musculoskeletal: Normal range of motion.  Positive pain on range of motion of the right hip. There is no pain to the knee or ankle. He has some chronic swelling to his right leg. There is no warmth or erythema. Pedal pulses are not palpated bilaterally but he has no evidence of ischemia.  Neurological: He is alert and oriented to person, place, and time.  Skin: Skin is warm and dry. Capillary refill takes less than 2 seconds.  Psychiatric: He has a normal mood and affect.  Vitals reviewed.    ED Treatments / Results  Labs (all labs ordered are listed, but only abnormal results are displayed) Labs Reviewed - No data to display  EKG  EKG Interpretation  Date/Time:  Monday August 29 2016 20:35:23 EST Ventricular Rate:  66 PR Interval:    QRS Duration: 151 QT Interval:  456 QTC Calculation: 478 R Axis:   -67 Text Interpretation:  Atrial flutter with predominant 4:1 AV block Left bundle branch block Confirmed by Latessa Tillis  MD, Deamonte Sayegh ([id number]) on 08/29/2016 9:48:36 PM       Radiology Ct Head Wo Contrast  Result  Date: 08/29/2016 CLINICAL DATA:  Status post fall, with head injury and neck pain. Initial encounter. EXAM: CT HEAD WITHOUT CONTRAST CT CERVICAL SPINE WITHOUT CONTRAST TECHNIQUE: Multidetector CT imaging of the head and cervical spine was performed following the standard protocol without intravenous contrast. Multiplanar CT image reconstructions of the cervical spine were also generated. COMPARISON:  CT of the head performed 02/28/2013, MRI of the brain performed 03/02/2013, and CT of the cervical spine performed 11/16/2006 FINDINGS: CT HEAD FINDINGS Brain: No evidence of acute infarction, hemorrhage, hydrocephalus, extra-axial collection or mass lesion/mass effect. Prominence of the ventricles and sulci reflects moderate cortical volume loss. Severe cerebellar atrophy is noted. There is significant chronic encephalomalacia at the right temporal and posterior parietal lobes, reflecting remote infarct, with ex vacuo dilatation of the temporal horn of the right lateral ventricle. Mild periventricular white matter change likely reflects small vessel ischemic microangiopathy. The brainstem and fourth ventricle are within normal limits. The basal ganglia are unremarkable in appearance. No mass effect or midline shift is seen. Vascular: No hyperdense vessel or unexpected calcification. Skull: There is no evidence of fracture; visualized osseous structures are unremarkable in appearance. Sinuses/Orbits: The orbits are within normal limits. The paranasal sinuses and mastoid air cells are well-aerated. Other: Soft tissue swelling is noted overlying the frontal calvarium. CT CERVICAL SPINE FINDINGS Alignment: Normal. Skull base and vertebrae: No acute fracture. No primary bone lesion or focal pathologic process. Soft tissues and spinal canal: No prevertebral fluid or swelling. No visible canal hematoma. Disc levels: Scattered anterior osteophytes are seen along the lower cervical and upper thoracic spine. Intervertebral disc  spaces are grossly preserved. Upper chest: Calcification is seen at the carotid bifurcations bilaterally. There is scattered heterogeneity within the thyroid gland, without a dominant mass. Other: No additional soft tissue abnormalities are seen. IMPRESSION: 1. No evidence of traumatic intracranial injury or fracture. 2. No evidence of fracture or subluxation along the cervical spine. 3. Moderate cortical volume loss, with superior cerebellar atrophy. Chronic encephalomalacia at the right temporal and posterior parietal lobes, reflecting remote infarct. Mild small vessel ischemic microangiopathy. 4. Mild degenerative change along the lower cervical and upper thoracic spine. 5. Calcification at the carotid bifurcations bilaterally. Carotid ultrasound would be helpful  for further evaluation, when and as deemed clinically appropriate. Electronically Signed   By: Roanna Raider M.D.   On: 08/29/2016 21:59   Ct Cervical Spine Wo Contrast  Result Date: 08/29/2016 CLINICAL DATA:  Status post fall, with head injury and neck pain. Initial encounter. EXAM: CT HEAD WITHOUT CONTRAST CT CERVICAL SPINE WITHOUT CONTRAST TECHNIQUE: Multidetector CT imaging of the head and cervical spine was performed following the standard protocol without intravenous contrast. Multiplanar CT image reconstructions of the cervical spine were also generated. COMPARISON:  CT of the head performed 02/28/2013, MRI of the brain performed 03/02/2013, and CT of the cervical spine performed 11/16/2006 FINDINGS: CT HEAD FINDINGS Brain: No evidence of acute infarction, hemorrhage, hydrocephalus, extra-axial collection or mass lesion/mass effect. Prominence of the ventricles and sulci reflects moderate cortical volume loss. Severe cerebellar atrophy is noted. There is significant chronic encephalomalacia at the right temporal and posterior parietal lobes, reflecting remote infarct, with ex vacuo dilatation of the temporal horn of the right lateral  ventricle. Mild periventricular white matter change likely reflects small vessel ischemic microangiopathy. The brainstem and fourth ventricle are within normal limits. The basal ganglia are unremarkable in appearance. No mass effect or midline shift is seen. Vascular: No hyperdense vessel or unexpected calcification. Skull: There is no evidence of fracture; visualized osseous structures are unremarkable in appearance. Sinuses/Orbits: The orbits are within normal limits. The paranasal sinuses and mastoid air cells are well-aerated. Other: Soft tissue swelling is noted overlying the frontal calvarium. CT CERVICAL SPINE FINDINGS Alignment: Normal. Skull base and vertebrae: No acute fracture. No primary bone lesion or focal pathologic process. Soft tissues and spinal canal: No prevertebral fluid or swelling. No visible canal hematoma. Disc levels: Scattered anterior osteophytes are seen along the lower cervical and upper thoracic spine. Intervertebral disc spaces are grossly preserved. Upper chest: Calcification is seen at the carotid bifurcations bilaterally. There is scattered heterogeneity within the thyroid gland, without a dominant mass. Other: No additional soft tissue abnormalities are seen. IMPRESSION: 1. No evidence of traumatic intracranial injury or fracture. 2. No evidence of fracture or subluxation along the cervical spine. 3. Moderate cortical volume loss, with superior cerebellar atrophy. Chronic encephalomalacia at the right temporal and posterior parietal lobes, reflecting remote infarct. Mild small vessel ischemic microangiopathy. 4. Mild degenerative change along the lower cervical and upper thoracic spine. 5. Calcification at the carotid bifurcations bilaterally. Carotid ultrasound would be helpful for further evaluation, when and as deemed clinically appropriate. Electronically Signed   By: Roanna Raider M.D.   On: 08/29/2016 21:59   Ct Hip Right Wo Contrast  Result Date: 08/29/2016 CLINICAL  DATA:  Acute onset of right hip pain after fall. Initial encounter. EXAM: CT OF THE RIGHT HIP WITHOUT CONTRAST TECHNIQUE: Multidetector CT imaging of the right hip was performed according to the standard protocol. Multiplanar CT image reconstructions were also generated. COMPARISON:  Right hip radiographs performed earlier today at 8:45 p.m. FINDINGS: Bones/Joint/Cartilage There is a minimally displaced oblique fracture extending through the medial aspect of the right acetabulum, passing across the iliopectineal line, with an associated fracture at the lateral aspect of the right superior pubic ramus. There is also a minimally displaced fracture at the medial right inferior pubic ramus. The patient's right hip screw is unremarkable in appearance, without evidence of loosening or fracture. The right femoral head remains seated at the acetabulum. Trace right hip joint fluid is noted. The cartilage is not well assessed on CT. Ligaments Suboptimally assessed by CT. Muscles and  Tendons The visualized musculature is grossly unremarkable in appearance. The tendons are difficult fully assess. Soft tissues Diffuse vascular calcifications are seen. The bladder is mildly distended and grossly unremarkable. The prostate remains normal in size. Visualized bowel loops are grossly unremarkable in appearance. IMPRESSION: 1. Minimally displaced oblique fracture extending through the medial aspect of the right acetabulum, passing across the iliopectineal line, with an associated fracture at the lateral aspect of the right superior pubic ramus. Minimally displaced fracture at the medial right inferior pubic ramus. 2. Right femoral head and neck are intact, with underlying hardware. 3. Diffuse vascular calcifications seen. Electronically Signed   By: Roanna Raider M.D.   On: 08/29/2016 23:45   Dg Hip Unilat  With Pelvis 2-3 Views Right  Result Date: 08/29/2016 CLINICAL DATA:  Right hip pain after fall. EXAM: DG HIP (WITH OR  WITHOUT PELVIS) 2-3V RIGHT COMPARISON:  CT abdomen/ pelvis 11/16/2006 FINDINGS: Lateral plate and screw with compression screw about the right proximal femur. The hardware is intact. No acute fracture is seen. No periprosthetic lucency. The pubic rami are intact allowing for patient rotation. Irregular ossification about both iliac crests is chronic and similar to that seen on prior CT. Left femur is well seated. IMPRESSION: Postsurgical change of the right hip. No hardware complication. No acute fracture. Electronically Signed   By: Rubye Oaks M.D.   On: 08/29/2016 21:12    Procedures Procedures (including critical care time)  Medications Ordered in ED Medications  traMADol (ULTRAM) tablet 50 mg (50 mg Oral Given 08/29/16 2124)     Initial Impression / Assessment and Plan / ED Course  I have reviewed the triage vital signs and the nursing notes.  Pertinent labs & imaging results that were available during my care of the patient were reviewed by me and considered in my medical decision making (see chart for details).  Clinical Course     Patient has no evidence of intracranial hemorrhage. There is no evidence of acute spinal injury. He does have evidence of a medial acetabular fracture and associated superior him. Her right pubic rami fractures. I spoke with Dr. Linna Caprice with orthopedics who advises the patient can be discharged and weight-bear as tolerated with a walker. Patient's pain is controlled in the ED. I spoke with the care provider at [facility] facility where the patient resides. I did advise her that patient will need assistance with ambulation but can weight-bear with a walker. I will give him a prescription for tramadol.  I advised the care provider that patient will need follow-up with Dr. Linna Caprice in 2 weeks.  Final Clinical Impressions(s) / ED Diagnoses   Final diagnoses:  Fall, initial encounter  Closed nondisplaced fracture of right acetabulum, unspecified  portion of acetabulum, initial encounter (HCC)  Closed fracture of multiple pubic rami, right, initial encounter (HCC)    New Prescriptions New Prescriptions   TRAMADOL (ULTRAM) 50 MG TABLET    Take 1 tablet (50 mg total) by mouth every 6 (six) hours as needed.     Rolan Bucco, MD 08/30/16 [id number]

## 2016-08-29 NOTE — ED Notes (Signed)
Patient transported to CT 

## 2016-08-29 NOTE — ED Notes (Signed)
Bed: [id number] Expected date:  Expected time:  Means of arrival:  Comments: 70 yo M/ Fall- hip pain

## 2016-08-29 NOTE — ED Triage Notes (Signed)
Per EMS pt fell while at dinner with family. Pt was transported back to [name] [address], by family and pt was reporting pain to right hip pain worse with movement or palpation. Denies any LOC with fall.

## 2016-08-30 MED ORDER — TRAMADOL HCL 50 MG PO TABS: 50.0000 mg | ORAL_TABLET | Freq: Four times a day (QID) | ORAL | 0 refills | Status: DC | PRN

## 2016-08-30 NOTE — ED Notes (Addendum)
[facility] Discharge instructions given, accepting facility verbalizes understanding. Transport called.

## 2016-08-31 ENCOUNTER — Emergency Department (HOSPITAL_COMMUNITY)
Admission: EM | Admit: 2016-08-31 | Discharge: 2016-08-31 | Disposition: A | Discharge status: Home / Self Care | Payer: Medicare Other | Attending: Emergency Medicine | Admitting: Emergency Medicine

## 2016-08-31 ENCOUNTER — Encounter (HOSPITAL_COMMUNITY): Payer: Self-pay | Admitting: Emergency Medicine

## 2016-08-31 DIAGNOSIS — Z7901 Long term (current) use of anticoagulants: Secondary | ICD-10-CM | POA: Diagnosis not present

## 2016-08-31 DIAGNOSIS — I2510 Atherosclerotic heart disease of native coronary artery without angina pectoris: Secondary | ICD-10-CM | POA: Insufficient documentation

## 2016-08-31 DIAGNOSIS — I10 Essential (primary) hypertension: Secondary | ICD-10-CM | POA: Diagnosis not present

## 2016-08-31 DIAGNOSIS — S32501A Unspecified fracture of right pubis, initial encounter for closed fracture: Secondary | ICD-10-CM | POA: Diagnosis not present

## 2016-08-31 DIAGNOSIS — Z79899 Other long term (current) drug therapy: Secondary | ICD-10-CM | POA: Insufficient documentation

## 2016-08-31 DIAGNOSIS — Z8673 Personal history of transient ischemic attack (TIA), and cerebral infarction without residual deficits: Secondary | ICD-10-CM | POA: Diagnosis not present

## 2016-08-31 DIAGNOSIS — Y929 Unspecified place or not applicable: Secondary | ICD-10-CM | POA: Diagnosis not present

## 2016-08-31 DIAGNOSIS — Z7982 Long term (current) use of aspirin: Secondary | ICD-10-CM | POA: Insufficient documentation

## 2016-08-31 DIAGNOSIS — Z955 Presence of coronary angioplasty implant and graft: Secondary | ICD-10-CM | POA: Diagnosis not present

## 2016-08-31 DIAGNOSIS — Y999 Unspecified external cause status: Secondary | ICD-10-CM | POA: Insufficient documentation

## 2016-08-31 DIAGNOSIS — W1830XA Fall on same level, unspecified, initial encounter: Secondary | ICD-10-CM | POA: Diagnosis not present

## 2016-08-31 DIAGNOSIS — S32491A Other specified fracture of right acetabulum, initial encounter for closed fracture: Secondary | ICD-10-CM | POA: Diagnosis present

## 2016-08-31 DIAGNOSIS — S32401A Unspecified fracture of right acetabulum, initial encounter for closed fracture: Secondary | ICD-10-CM

## 2016-08-31 DIAGNOSIS — Y939 Activity, unspecified: Secondary | ICD-10-CM | POA: Insufficient documentation

## 2016-08-31 DIAGNOSIS — S32511A Fracture of superior rim of right pubis, initial encounter for closed fracture: Secondary | ICD-10-CM

## 2016-08-31 LAB — BASIC METABOLIC PANEL
ANION GAP: 10 (ref 5–15)
BUN: 29 mg/dL — ABNORMAL HIGH (ref 6–20)
CHLORIDE: 99 mmol/L — AB (ref 101–111)
CO2: 30 mmol/L (ref 22–32)
Calcium: 9.4 mg/dL (ref 8.9–10.3)
Creatinine, Ser: 1.68 mg/dL — ABNORMAL HIGH (ref 0.61–1.24)
GFR calc Af Amer: 45 mL/min — ABNORMAL LOW (ref >60)
GFR calc non Af Amer: 39 mL/min — ABNORMAL LOW (ref >60)
Glucose, Bld: 95 mg/dL (ref 65–99)
POTASSIUM: 3.8 mmol/L (ref 3.5–5.1)
SODIUM: 139 mmol/L (ref 135–145)

## 2016-08-31 LAB — CBC
HCT: 37.9 % — ABNORMAL LOW (ref 39.0–52.0)
HEMOGLOBIN: 12.6 g/dL — AB (ref 13.0–17.0)
MCH: 27.5 pg (ref 26.0–34.0)
MCHC: 33.2 g/dL (ref 30.0–36.0)
MCV: 82.8 fL (ref 78.0–100.0)
Platelets: 159 10*3/uL (ref 150–400)
RBC: 4.58 MIL/uL (ref 4.22–5.81)
RDW: 15 % (ref 11.5–15.5)
WBC: 10.3 10*3/uL (ref 4.0–10.5)

## 2016-08-31 MED ORDER — HYDROCODONE-ACETAMINOPHEN 5-325 MG PO TABS
2.0000 | ORAL_TABLET | Freq: Once | ORAL | Status: AC
  Administered 2016-08-31: 2 via ORAL
  Filled 2016-08-31: qty 2

## 2016-08-31 NOTE — Discharge Instructions (Addendum)
It was our pleasure to provide your ER care today - we hope that you feel better.  We discussed your case with our social worker - they indicate that you may work with your current facility, and your primary care doctor's office, to work towards placement in a new facility (if you desire a higher level of care).  Alternatively, you could discuss with your facility and primary care doctor temporarily arranging more home health care agency assistance while you heal from your recent injury.    They may also be able to assist with wheelchair, and/or other means of assisted mobility.   Take your pain medication as need.  Return to ER if worse, new symptoms, or other medical emergency.     Use walker, and assistance when

## 2016-08-31 NOTE — Progress Notes (Signed)
[facility] [place] They are unable to take patient via ems and will only take patient via walk-in. Patients brother is unable to transport patient and stated [facility] will [facility]. [facility] and [facility] are ready for [facility] to return. CSW updated RN and scheduled transport to [facility] at 6:15pm.  Stacy GardnerErin Skylin Kennerson, [facility] Clinical Social Worker [phone]

## 2016-08-31 NOTE — ED Provider Notes (Addendum)
[facility]-EMERGENCY DEPT Provider Note   CSN: [id number] Arrival date & time: 08/31/16  1414     History   Chief Complaint Chief Complaint  Patient presents with  . Hip Pain    HPI [name] is a 70 y.o. male.  Patient from [facility], was in ED yesterday and dx with non-displaced right acetabular fx and pubic rami fx - ortho consult done then indicated to return to facility and that patient could weight bear as tolerated.  [facility] sends back to ED today as given pain, it is difficult for patient to attend to his ADLs.  Patient c/o right hip pain, constant, non radiating, mod-severe, worse w movement/wt bearing. Denies other pain or injury.    The history is provided by the patient.  Hip Pain  Pertinent negatives include no chest pain, no abdominal pain, no headaches and no shortness of breath.    Past Medical History:  Diagnosis Date  . A-fib (HCC)   . BPH (benign prostatic hyperplasia)   . Coronary artery disease   . Dementia with behavioral disturbance 11/01/2015  . Glaucoma   . H/O hiatal hernia   . Hypertension   . LBBB (left bundle branch block)   . Rib fracture 09/18/2012  . Seizures (HCC)    "h/o gran mal & petit" (08/07/2012)  . Stroke [facility]) 1980's   "mini w/seizure at the same time" (08/07/2012)  . Suicidal ideation   . Syncope and collapse 08/07/2012   "w/loss of consciousness; found down; don't know how long he was out" (08/07/2012)    Patient Active Problem List   Diagnosis Date Noted  . Weight loss, non-intentional 03/03/2016  . Acute upper respiratory infection 03/03/2016  . Essential hypertension 03/03/2016  . Hyperlipidemia 03/03/2016  . Abnormal echocardiogram 03/03/2016  . Ventral hernia without obstruction or gangrene 01/26/2016  . Hypertensive heart disease 12/31/2015  . Atrial fibrillation and flutter (HCC) 12/31/2015  . Left ventricular dysfunction 12/31/2015  . CAD (coronary artery disease) 12/31/2015  . Anemia, iron deficiency 12/31/2015  . GERD  without esophagitis 12/31/2015  . Dyslipidemia 12/31/2015  . H/O: glaucoma 11/04/2015  . H/O coronary artery bypass surgery 11/04/2015  . Major depressive disorder 11/03/2015  . Dementia with behavioral disturbance 11/01/2015  . Depression, major, single episode, severe (HCC) 11/01/2015  . Seizure disorder (HCC) 08/09/2012  . BPH (benign prostatic hyperplasia)     Past Surgical History:  Procedure Laterality Date  . CORONARY ARTERY BYPASS GRAFT  ~ 2010   CABG X1  . TIBIA FRACTURE SURGERY  1960's   RLE       Home Medications    Prior to Admission medications   Medication Sig Start Date End Date Taking? Authorizing Provider  apixaban (ELIQUIS) 5 MG TABS tablet Take 5 mg by mouth 2 (two) times daily.    Historical Provider, MD  aspirin 325 MG tablet Take 325 mg by mouth daily.    Historical Provider, MD  brimonidine (ALPHAGAN) 0.2 % ophthalmic solution Place into both eyes 2 (two) times daily. Reported on 01/05/2016    Historical Provider, MD  escitalopram (LEXAPRO) 10 MG tablet Take 10 mg by mouth daily.  11/17/15   Historical Provider, MD  ferrous sulfate 325 (65 FE) MG tablet Take 325 mg by mouth 3 (three) times daily with meals.    Historical Provider, MD  finasteride (PROSCAR) 5 MG tablet Take 5 mg by mouth daily.    Historical Provider, MD  furosemide (LASIX) 40 MG tablet Take 40 mg by  mouth 2 (two) times daily.    Historical Provider, MD  lamoTRIgine (LAMICTAL) 200 MG tablet Take 200 mg by mouth 2 (two) times daily.    Historical Provider, MD  latanoprost (XALATAN) 0.005 % ophthalmic solution Place 1 drop into both eyes at bedtime.    Historical Provider, MD  levETIRAcetam (KEPPRA) 500 MG tablet Take 500 mg by mouth 2 (two) times daily.    Historical Provider, MD  LORazepam (ATIVAN) 0.5 MG tablet Take 0.5 mg by mouth every 12 (twelve) hours as needed for anxiety (agitation).    Historical Provider, MD  memantine (NAMENDA) 5 MG tablet Take 5 mg by mouth 2 (two) times daily.     Historical Provider, MD  metoprolol succinate (TOPROL-XL) 100 MG 24 hr tablet Take 100 mg by mouth daily. Take with or immediately following a meal.    Historical Provider, MD  metoprolol succinate (TOPROL-XL) 50 MG 24 hr tablet Take 50 mg by mouth daily. Take with or immediately following a meal.    Historical Provider, MD  Multiple Vitamin (MULTIVITAMIN WITH MINERALS) TABS Take 1 tablet by mouth daily.    Historical Provider, MD  Neomy-Bacit-Polymyx-Pramoxine 1 % OINT Apply 1 application topically daily.     Historical Provider, MD  Nutritional Supplements (NUTRITIONAL SHAKE PO) Take 1 Can by mouth 2 (two) times daily. Health Shake    Historical Provider, MD  traMADol (ULTRAM) 50 MG tablet Take 1 tablet (50 mg total) by mouth every 6 (six) hours as needed. 08/30/16   Rolan BuccoMelanie Belfi, MD    Family History Family History  Problem Relation Age of Onset  . Heart disease      No family history    Social History Social History  Substance Use Topics  . Smoking status: Never Smoker  . Smokeless tobacco: Never Used  . Alcohol use No     Allergies   Codeine   Review of Systems Review of Systems  Constitutional: Negative for fever.  HENT: Negative for sore throat.   Eyes: Negative for redness.  Respiratory: Negative for shortness of breath.   Cardiovascular: Negative for chest pain.  Gastrointestinal: Negative for abdominal pain.  Genitourinary: Negative for flank pain.  Musculoskeletal: Negative for back pain and neck pain.  Skin: Negative for rash.  Neurological: Negative for headaches.  Hematological: Does not bruise/bleed easily.  Psychiatric/Behavioral: Negative for confusion.     Physical Exam Updated Vital Signs There were no vitals taken for this visit.  Physical Exam  Constitutional: He appears well-developed and well-nourished. No distress.  HENT:  Head: Atraumatic.  Mouth/Throat: Oropharynx is clear and moist.  Eyes: Conjunctivae are normal.  Neck: Neck supple.  No tracheal deviation present.  Cardiovascular: Normal rate, normal heart sounds and intact distal pulses.   Pulmonary/Chest: Effort normal and breath sounds normal. No accessory muscle usage. No respiratory distress.  Abdominal: Soft. He exhibits no distension. There is no tenderness.  Musculoskeletal: He exhibits no edema.  Pain w rom right hip. Distal pulses palp. CTLS spine, non tender, aligned, no step off.   Neurological: He is alert.  Moves bil ext purposefully, w good strength. sens grossly intact.   Skin: Skin is warm and dry. He is not diaphoretic.  Psychiatric: He has a normal mood and affect.  Nursing note and vitals reviewed.    ED Treatments / Results  Labs (all labs ordered are listed, but only abnormal results are displayed) Results for orders placed or performed during the hospital encounter of 08/31/16  CBC  Result Value Ref Range   WBC 10.3 4.0 - 10.5 K/uL   RBC 4.58 4.22 - 5.81 MIL/uL   Hemoglobin 12.6 (L) 13.0 - 17.0 g/dL   HCT 16.1 (L) 09.6 - 04.5 %   MCV 82.8 78.0 - 100.0 fL   MCH 27.5 26.0 - 34.0 pg   MCHC 33.2 30.0 - 36.0 g/dL   RDW 40.9 81.1 - 91.4 %   Platelets 159 150 - 400 K/uL  Basic metabolic panel  Result Value Ref Range   Sodium 139 135 - 145 mmol/L   Potassium 3.8 3.5 - 5.1 mmol/L   Chloride 99 (L) 101 - 111 mmol/L   CO2 30 22 - 32 mmol/L   Glucose, Bld 95 65 - 99 mg/dL   BUN 29 (H) 6 - 20 mg/dL   Creatinine, Ser 7.82 (H) 0.61 - 1.24 mg/dL   Calcium 9.4 8.9 - 95.6 mg/dL   GFR calc non Af Amer 39 (L) >60 mL/min   GFR calc Af Amer 45 (L) >60 mL/min   Anion gap 10 5 - 15   Ct Head Wo Contrast  Result Date: 08/29/2016 CLINICAL DATA:  Status post fall, with head injury and neck pain. Initial encounter. EXAM: CT HEAD WITHOUT CONTRAST CT CERVICAL SPINE WITHOUT CONTRAST TECHNIQUE: Multidetector CT imaging of the head and cervical spine was performed following the standard protocol without intravenous contrast. Multiplanar CT image  reconstructions of the cervical spine were also generated. COMPARISON:  CT of the head performed 02/28/2013, MRI of the brain performed 03/02/2013, and CT of the cervical spine performed 11/16/2006 FINDINGS: CT HEAD FINDINGS Brain: No evidence of acute infarction, hemorrhage, hydrocephalus, extra-axial collection or mass lesion/mass effect. Prominence of the ventricles and sulci reflects moderate cortical volume loss. Severe cerebellar atrophy is noted. There is significant chronic encephalomalacia at the right temporal and posterior parietal lobes, reflecting remote infarct, with ex vacuo dilatation of the temporal horn of the right lateral ventricle. Mild periventricular white matter change likely reflects small vessel ischemic microangiopathy. The brainstem and fourth ventricle are within normal limits. The basal ganglia are unremarkable in appearance. No mass effect or midline shift is seen. Vascular: No hyperdense vessel or unexpected calcification. Skull: There is no evidence of fracture; visualized osseous structures are unremarkable in appearance. Sinuses/Orbits: The orbits are within normal limits. The paranasal sinuses and mastoid air cells are well-aerated. Other: Soft tissue swelling is noted overlying the frontal calvarium. CT CERVICAL SPINE FINDINGS Alignment: Normal. Skull base and vertebrae: No acute fracture. No primary bone lesion or focal pathologic process. Soft tissues and spinal canal: No prevertebral fluid or swelling. No visible canal hematoma. Disc levels: Scattered anterior osteophytes are seen along the lower cervical and upper thoracic spine. Intervertebral disc spaces are grossly preserved. Upper chest: Calcification is seen at the carotid bifurcations bilaterally. There is scattered heterogeneity within the thyroid gland, without a dominant mass. Other: No additional soft tissue abnormalities are seen. IMPRESSION: 1. No evidence of traumatic intracranial injury or fracture. 2. No  evidence of fracture or subluxation along the cervical spine. 3. Moderate cortical volume loss, with superior cerebellar atrophy. Chronic encephalomalacia at the right temporal and posterior parietal lobes, reflecting remote infarct. Mild small vessel ischemic microangiopathy. 4. Mild degenerative change along the lower cervical and upper thoracic spine. 5. Calcification at the carotid bifurcations bilaterally. Carotid ultrasound would be helpful for further evaluation, when and as deemed clinically appropriate. Electronically Signed   By: Roanna Raider M.D.   On: 08/29/2016 21:59  Ct Cervical Spine Wo Contrast  Result Date: 08/29/2016 CLINICAL DATA:  Status post fall, with head injury and neck pain. Initial encounter. EXAM: CT HEAD WITHOUT CONTRAST CT CERVICAL SPINE WITHOUT CONTRAST TECHNIQUE: Multidetector CT imaging of the head and cervical spine was performed following the standard protocol without intravenous contrast. Multiplanar CT image reconstructions of the cervical spine were also generated. COMPARISON:  CT of the head performed 02/28/2013, MRI of the brain performed 03/02/2013, and CT of the cervical spine performed 11/16/2006 FINDINGS: CT HEAD FINDINGS Brain: No evidence of acute infarction, hemorrhage, hydrocephalus, extra-axial collection or mass lesion/mass effect. Prominence of the ventricles and sulci reflects moderate cortical volume loss. Severe cerebellar atrophy is noted. There is significant chronic encephalomalacia at the right temporal and posterior parietal lobes, reflecting remote infarct, with ex vacuo dilatation of the temporal horn of the right lateral ventricle. Mild periventricular white matter change likely reflects small vessel ischemic microangiopathy. The brainstem and fourth ventricle are within normal limits. The basal ganglia are unremarkable in appearance. No mass effect or midline shift is seen. Vascular: No hyperdense vessel or unexpected calcification. Skull: There  is no evidence of fracture; visualized osseous structures are unremarkable in appearance. Sinuses/Orbits: The orbits are within normal limits. The paranasal sinuses and mastoid air cells are well-aerated. Other: Soft tissue swelling is noted overlying the frontal calvarium. CT CERVICAL SPINE FINDINGS Alignment: Normal. Skull base and vertebrae: No acute fracture. No primary bone lesion or focal pathologic process. Soft tissues and spinal canal: No prevertebral fluid or swelling. No visible canal hematoma. Disc levels: Scattered anterior osteophytes are seen along the lower cervical and upper thoracic spine. Intervertebral disc spaces are grossly preserved. Upper chest: Calcification is seen at the carotid bifurcations bilaterally. There is scattered heterogeneity within the thyroid gland, without a dominant mass. Other: No additional soft tissue abnormalities are seen. IMPRESSION: 1. No evidence of traumatic intracranial injury or fracture. 2. No evidence of fracture or subluxation along the cervical spine. 3. Moderate cortical volume loss, with superior cerebellar atrophy. Chronic encephalomalacia at the right temporal and posterior parietal lobes, reflecting remote infarct. Mild small vessel ischemic microangiopathy. 4. Mild degenerative change along the lower cervical and upper thoracic spine. 5. Calcification at the carotid bifurcations bilaterally. Carotid ultrasound would be helpful for further evaluation, when and as deemed clinically appropriate. Electronically Signed   By: Roanna Raider M.D.   On: 08/29/2016 21:59   Ct Hip Right Wo Contrast  Result Date: 08/29/2016 CLINICAL DATA:  Acute onset of right hip pain after fall. Initial encounter. EXAM: CT OF THE RIGHT HIP WITHOUT CONTRAST TECHNIQUE: Multidetector CT imaging of the right hip was performed according to the standard protocol. Multiplanar CT image reconstructions were also generated. COMPARISON:  Right hip radiographs performed earlier today at  8:45 p.m. FINDINGS: Bones/Joint/Cartilage There is a minimally displaced oblique fracture extending through the medial aspect of the right acetabulum, passing across the iliopectineal line, with an associated fracture at the lateral aspect of the right superior pubic ramus. There is also a minimally displaced fracture at the medial right inferior pubic ramus. The patient's right hip screw is unremarkable in appearance, without evidence of loosening or fracture. The right femoral head remains seated at the acetabulum. Trace right hip joint fluid is noted. The cartilage is not well assessed on CT. Ligaments Suboptimally assessed by CT. Muscles and Tendons The visualized musculature is grossly unremarkable in appearance. The tendons are difficult fully assess. Soft tissues Diffuse vascular calcifications are seen. The bladder is  mildly distended and grossly unremarkable. The prostate remains normal in size. Visualized bowel loops are grossly unremarkable in appearance. IMPRESSION: 1. Minimally displaced oblique fracture extending through the medial aspect of the right acetabulum, passing across the iliopectineal line, with an associated fracture at the lateral aspect of the right superior pubic ramus. Minimally displaced fracture at the medial right inferior pubic ramus. 2. Right femoral head and neck are intact, with underlying hardware. 3. Diffuse vascular calcifications seen. Electronically Signed   By: Roanna Raider M.D.   On: 08/29/2016 23:45   Dg Hip Unilat  With Pelvis 2-3 Views Right  Result Date: 08/29/2016 CLINICAL DATA:  Right hip pain after fall. EXAM: DG HIP (WITH OR WITHOUT PELVIS) 2-3V RIGHT COMPARISON:  CT abdomen/ pelvis 11/16/2006 FINDINGS: Lateral plate and screw with compression screw about the right proximal femur. The hardware is intact. No acute fracture is seen. No periprosthetic lucency. The pubic rami are intact allowing for patient rotation. Irregular ossification about both iliac  crests is chronic and similar to that seen on prior CT. Left femur is well seated. IMPRESSION: Postsurgical change of the right hip. No hardware complication. No acute fracture. Electronically Signed   By: Rubye Oaks M.D.   On: 08/29/2016 21:12    EKG  EKG Interpretation None       Radiology Ct Head Wo Contrast  Result Date: 08/29/2016 CLINICAL DATA:  Status post fall, with head injury and neck pain. Initial encounter. EXAM: CT HEAD WITHOUT CONTRAST CT CERVICAL SPINE WITHOUT CONTRAST TECHNIQUE: Multidetector CT imaging of the head and cervical spine was performed following the standard protocol without intravenous contrast. Multiplanar CT image reconstructions of the cervical spine were also generated. COMPARISON:  CT of the head performed 02/28/2013, MRI of the brain performed 03/02/2013, and CT of the cervical spine performed 11/16/2006 FINDINGS: CT HEAD FINDINGS Brain: No evidence of acute infarction, hemorrhage, hydrocephalus, extra-axial collection or mass lesion/mass effect. Prominence of the ventricles and sulci reflects moderate cortical volume loss. Severe cerebellar atrophy is noted. There is significant chronic encephalomalacia at the right temporal and posterior parietal lobes, reflecting remote infarct, with ex vacuo dilatation of the temporal horn of the right lateral ventricle. Mild periventricular white matter change likely reflects small vessel ischemic microangiopathy. The brainstem and fourth ventricle are within normal limits. The basal ganglia are unremarkable in appearance. No mass effect or midline shift is seen. Vascular: No hyperdense vessel or unexpected calcification. Skull: There is no evidence of fracture; visualized osseous structures are unremarkable in appearance. Sinuses/Orbits: The orbits are within normal limits. The paranasal sinuses and mastoid air cells are well-aerated. Other: Soft tissue swelling is noted overlying the frontal calvarium. CT CERVICAL SPINE  FINDINGS Alignment: Normal. Skull base and vertebrae: No acute fracture. No primary bone lesion or focal pathologic process. Soft tissues and spinal canal: No prevertebral fluid or swelling. No visible canal hematoma. Disc levels: Scattered anterior osteophytes are seen along the lower cervical and upper thoracic spine. Intervertebral disc spaces are grossly preserved. Upper chest: Calcification is seen at the carotid bifurcations bilaterally. There is scattered heterogeneity within the thyroid gland, without a dominant mass. Other: No additional soft tissue abnormalities are seen. IMPRESSION: 1. No evidence of traumatic intracranial injury or fracture. 2. No evidence of fracture or subluxation along the cervical spine. 3. Moderate cortical volume loss, with superior cerebellar atrophy. Chronic encephalomalacia at the right temporal and posterior parietal lobes, reflecting remote infarct. Mild small vessel ischemic microangiopathy. 4. Mild degenerative change along the lower  cervical and upper thoracic spine. 5. Calcification at the carotid bifurcations bilaterally. Carotid ultrasound would be helpful for further evaluation, when and as deemed clinically appropriate. Electronically Signed   By: Roanna RaiderJeffery  Chang M.D.   On: 08/29/2016 21:59   Ct Cervical Spine Wo Contrast  Result Date: 08/29/2016 CLINICAL DATA:  Status post fall, with head injury and neck pain. Initial encounter. EXAM: CT HEAD WITHOUT CONTRAST CT CERVICAL SPINE WITHOUT CONTRAST TECHNIQUE: Multidetector CT imaging of the head and cervical spine was performed following the standard protocol without intravenous contrast. Multiplanar CT image reconstructions of the cervical spine were also generated. COMPARISON:  CT of the head performed 02/28/2013, MRI of the brain performed 03/02/2013, and CT of the cervical spine performed 11/16/2006 FINDINGS: CT HEAD FINDINGS Brain: No evidence of acute infarction, hemorrhage, hydrocephalus, extra-axial collection  or mass lesion/mass effect. Prominence of the ventricles and sulci reflects moderate cortical volume loss. Severe cerebellar atrophy is noted. There is significant chronic encephalomalacia at the right temporal and posterior parietal lobes, reflecting remote infarct, with ex vacuo dilatation of the temporal horn of the right lateral ventricle. Mild periventricular white matter change likely reflects small vessel ischemic microangiopathy. The brainstem and fourth ventricle are within normal limits. The basal ganglia are unremarkable in appearance. No mass effect or midline shift is seen. Vascular: No hyperdense vessel or unexpected calcification. Skull: There is no evidence of fracture; visualized osseous structures are unremarkable in appearance. Sinuses/Orbits: The orbits are within normal limits. The paranasal sinuses and mastoid air cells are well-aerated. Other: Soft tissue swelling is noted overlying the frontal calvarium. CT CERVICAL SPINE FINDINGS Alignment: Normal. Skull base and vertebrae: No acute fracture. No primary bone lesion or focal pathologic process. Soft tissues and spinal canal: No prevertebral fluid or swelling. No visible canal hematoma. Disc levels: Scattered anterior osteophytes are seen along the lower cervical and upper thoracic spine. Intervertebral disc spaces are grossly preserved. Upper chest: Calcification is seen at the carotid bifurcations bilaterally. There is scattered heterogeneity within the thyroid gland, without a dominant mass. Other: No additional soft tissue abnormalities are seen. IMPRESSION: 1. No evidence of traumatic intracranial injury or fracture. 2. No evidence of fracture or subluxation along the cervical spine. 3. Moderate cortical volume loss, with superior cerebellar atrophy. Chronic encephalomalacia at the right temporal and posterior parietal lobes, reflecting remote infarct. Mild small vessel ischemic microangiopathy. 4. Mild degenerative change along the lower  cervical and upper thoracic spine. 5. Calcification at the carotid bifurcations bilaterally. Carotid ultrasound would be helpful for further evaluation, when and as deemed clinically appropriate. Electronically Signed   By: Roanna RaiderJeffery  Chang M.D.   On: 08/29/2016 21:59   Ct Hip Right Wo Contrast  Result Date: 08/29/2016 CLINICAL DATA:  Acute onset of right hip pain after fall. Initial encounter. EXAM: CT OF THE RIGHT HIP WITHOUT CONTRAST TECHNIQUE: Multidetector CT imaging of the right hip was performed according to the standard protocol. Multiplanar CT image reconstructions were also generated. COMPARISON:  Right hip radiographs performed earlier today at 8:45 p.m. FINDINGS: Bones/Joint/Cartilage There is a minimally displaced oblique fracture extending through the medial aspect of the right acetabulum, passing across the iliopectineal line, with an associated fracture at the lateral aspect of the right superior pubic ramus. There is also a minimally displaced fracture at the medial right inferior pubic ramus. The patient's right hip screw is unremarkable in appearance, without evidence of loosening or fracture. The right femoral head remains seated at the acetabulum. Trace right hip joint fluid  is noted. The cartilage is not well assessed on CT. Ligaments Suboptimally assessed by CT. Muscles and Tendons The visualized musculature is grossly unremarkable in appearance. The tendons are difficult fully assess. Soft tissues Diffuse vascular calcifications are seen. The bladder is mildly distended and grossly unremarkable. The prostate remains normal in size. Visualized bowel loops are grossly unremarkable in appearance. IMPRESSION: 1. Minimally displaced oblique fracture extending through the medial aspect of the right acetabulum, passing across the iliopectineal line, with an associated fracture at the lateral aspect of the right superior pubic ramus. Minimally displaced fracture at the medial right inferior pubic  ramus. 2. Right femoral head and neck are intact, with underlying hardware. 3. Diffuse vascular calcifications seen. Electronically Signed   By: Roanna Raider M.D.   On: 08/29/2016 23:45   Dg Hip Unilat  With Pelvis 2-3 Views Right  Result Date: 08/29/2016 CLINICAL DATA:  Right hip pain after fall. EXAM: DG HIP (WITH OR WITHOUT PELVIS) 2-3V RIGHT COMPARISON:  CT abdomen/ pelvis 11/16/2006 FINDINGS: Lateral plate and screw with compression screw about the right proximal femur. The hardware is intact. No acute fracture is seen. No periprosthetic lucency. The pubic rami are intact allowing for patient rotation. Irregular ossification about both iliac crests is chronic and similar to that seen on prior CT. Left femur is well seated. IMPRESSION: Postsurgical change of the right hip. No hardware complication. No acute fracture. Electronically Signed   By: Rubye Oaks M.D.   On: 08/29/2016 21:12    Procedures Procedures (including critical care time)  Medications Ordered in ED Medications - No data to display   Initial Impression / Assessment and Plan / ED Course  I have reviewed the triage vital signs and the nursing notes.  Pertinent labs & imaging results that were available during my care of the patient were reviewed by me and considered in my medical decision making (see chart for details).  Clinical Course     Reviewed nursing notes and prior charts for additional history.   Hydrocodone po.   Social work consulted.   Social work has assessed and spoken with family - they indicate that patients current ECF, pcp, and family will need to work together should patient require placement to new facility with higher level of care.   Patient appears comfortable and in no acute distress.   Vitals normal.  Patient currently appears stable for d/c back to ecf.     Final Clinical Impressions(s) / ED Diagnoses   Final diagnoses:  None    New Prescriptions New Prescriptions   No  medications on file     Cathren Laine, MD 08/31/16 1446    Cathren Laine, MD 08/31/16 1700

## 2016-08-31 NOTE — Progress Notes (Signed)
CSW spoke with patients son regarding discharge plans. Patients son states that he would like patient to either be admitted or transferred to the [facility] in [place]. Patients son states " [facility] they said they will do a Travel Consult. We need that". CSW requested number patients son called [phone]. This number was for the [facility] choice program and they were unable to give CSW information. Patients son requested CSW to contact PCP to notify them of patients needs. CSW encouraged patients son to contact PCP. CSW will continue to update.   Stacy GardnerErin Jahzion Brogden, LCSWA Clinical Social Worker [phone]

## 2016-08-31 NOTE — Progress Notes (Signed)
CSW contacted [facility] in [place]. Their ED is open 24/7 if patient would like to go to a [facility] emergency room.   Stacy GardnerErin Peretz Thieme, LCSWA Clinical Social Worker [phone]

## 2016-08-31 NOTE — ED Triage Notes (Signed)
Per PTAR-states was seen yesterday and diagnosed with left broken hip-was [facility] sending him here because he can't get up and perform ADLs-patient complaining of left hip pain

## 2016-08-31 NOTE — ED Notes (Signed)
Pt in hallway hollering, pt states his phone his ringing but pt does not have a phone with him.

## 2016-08-31 NOTE — ED Notes (Signed)
[facility] Expected date:  Expected time:  Means of arrival:  Comments: EMS-hip pain/FX

## 2016-08-31 NOTE — Progress Notes (Signed)
ED CM consulted by EDP, Steinl about a [facility] facility pt  CM referred EDP to covering SW and ED SW at 3 pm

## 2016-11-16 ENCOUNTER — Inpatient Hospital Stay (HOSPITAL_COMMUNITY)
Admission: EM | Admit: 2016-11-16 | Discharge: 2016-11-19 | DRG: 092 | Disposition: A | Discharge status: Home Health | Payer: Medicare Other | Attending: Family Medicine | Admitting: Family Medicine

## 2016-11-16 ENCOUNTER — Emergency Department (HOSPITAL_COMMUNITY): Payer: Medicare Other

## 2016-11-16 ENCOUNTER — Encounter (HOSPITAL_COMMUNITY): Payer: Self-pay

## 2016-11-16 DIAGNOSIS — I2510 Atherosclerotic heart disease of native coronary artery without angina pectoris: Secondary | ICD-10-CM | POA: Diagnosis present

## 2016-11-16 DIAGNOSIS — Z8673 Personal history of transient ischemic attack (TIA), and cerebral infarction without residual deficits: Secondary | ICD-10-CM

## 2016-11-16 DIAGNOSIS — Z7901 Long term (current) use of anticoagulants: Secondary | ICD-10-CM

## 2016-11-16 DIAGNOSIS — D649 Anemia, unspecified: Secondary | ICD-10-CM | POA: Diagnosis present

## 2016-11-16 DIAGNOSIS — J449 Chronic obstructive pulmonary disease, unspecified: Secondary | ICD-10-CM | POA: Diagnosis present

## 2016-11-16 DIAGNOSIS — G40909 Epilepsy, unspecified, not intractable, without status epilepticus: Secondary | ICD-10-CM | POA: Diagnosis present

## 2016-11-16 DIAGNOSIS — F0391 Unspecified dementia with behavioral disturbance: Secondary | ICD-10-CM | POA: Diagnosis present

## 2016-11-16 DIAGNOSIS — G928 Other toxic encephalopathy: Secondary | ICD-10-CM | POA: Diagnosis present

## 2016-11-16 DIAGNOSIS — E876 Hypokalemia: Secondary | ICD-10-CM | POA: Diagnosis present

## 2016-11-16 DIAGNOSIS — I4891 Unspecified atrial fibrillation: Secondary | ICD-10-CM | POA: Diagnosis present

## 2016-11-16 DIAGNOSIS — I447 Left bundle-branch block, unspecified: Secondary | ICD-10-CM | POA: Diagnosis present

## 2016-11-16 DIAGNOSIS — G92 Toxic encephalopathy: Principal | ICD-10-CM

## 2016-11-16 DIAGNOSIS — N400 Enlarged prostate without lower urinary tract symptoms: Secondary | ICD-10-CM | POA: Diagnosis present

## 2016-11-16 DIAGNOSIS — Z885 Allergy status to narcotic agent status: Secondary | ICD-10-CM

## 2016-11-16 DIAGNOSIS — Z8659 Personal history of other mental and behavioral disorders: Secondary | ICD-10-CM

## 2016-11-16 DIAGNOSIS — H409 Unspecified glaucoma: Secondary | ICD-10-CM | POA: Diagnosis present

## 2016-11-16 DIAGNOSIS — Z951 Presence of aortocoronary bypass graft: Secondary | ICD-10-CM

## 2016-11-16 DIAGNOSIS — R296 Repeated falls: Secondary | ICD-10-CM | POA: Diagnosis present

## 2016-11-16 DIAGNOSIS — Z66 Do not resuscitate: Secondary | ICD-10-CM | POA: Diagnosis present

## 2016-11-16 DIAGNOSIS — Z79899 Other long term (current) drug therapy: Secondary | ICD-10-CM

## 2016-11-16 DIAGNOSIS — M810 Age-related osteoporosis without current pathological fracture: Secondary | ICD-10-CM | POA: Diagnosis present

## 2016-11-16 DIAGNOSIS — G9340 Encephalopathy, unspecified: Secondary | ICD-10-CM | POA: Diagnosis not present

## 2016-11-16 DIAGNOSIS — Z7982 Long term (current) use of aspirin: Secondary | ICD-10-CM

## 2016-11-16 LAB — COMPREHENSIVE METABOLIC PANEL
ALT: 12 U/L — ABNORMAL LOW (ref 17–63)
ANION GAP: 10 (ref 5–15)
AST: 25 U/L (ref 15–41)
Albumin: 3.9 g/dL (ref 3.5–5.0)
Alkaline Phosphatase: 87 U/L (ref 38–126)
BILIRUBIN TOTAL: 1.6 mg/dL — AB (ref 0.3–1.2)
BUN: 12 mg/dL (ref 6–20)
CO2: 25 mmol/L (ref 22–32)
Calcium: 8.9 mg/dL (ref 8.9–10.3)
Chloride: 106 mmol/L (ref 101–111)
Creatinine, Ser: 1.18 mg/dL (ref 0.61–1.24)
GFR, EST NON AFRICAN AMERICAN: 59 mL/min — AB (ref >60)
Glucose, Bld: 87 mg/dL (ref 65–99)
POTASSIUM: 3 mmol/L — AB (ref 3.5–5.1)
Sodium: 141 mmol/L (ref 135–145)
TOTAL PROTEIN: 8.2 g/dL — AB (ref 6.5–8.1)

## 2016-11-16 LAB — CBC
HCT: 27.1 % — ABNORMAL LOW (ref 39.0–52.0)
Hemoglobin: 8.6 g/dL — ABNORMAL LOW (ref 13.0–17.0)
MCH: 26.3 pg (ref 26.0–34.0)
MCHC: 31.7 g/dL (ref 30.0–36.0)
MCV: 82.9 fL (ref 78.0–100.0)
PLATELETS: 266 10*3/uL (ref 150–400)
RBC: 3.27 MIL/uL — AB (ref 4.22–5.81)
RDW: 17.8 % — AB (ref 11.5–15.5)
WBC: 6.1 10*3/uL (ref 4.0–10.5)

## 2016-11-16 LAB — URINALYSIS, ROUTINE W REFLEX MICROSCOPIC
BILIRUBIN URINE: NEGATIVE
Glucose, UA: NEGATIVE mg/dL
HGB URINE DIPSTICK: NEGATIVE
Ketones, ur: 20 mg/dL — AB
LEUKOCYTES UA: NEGATIVE
NITRITE: NEGATIVE
PH: 5 (ref 5.0–8.0)
Protein, ur: 100 mg/dL — AB
SPECIFIC GRAVITY, URINE: 1.017 (ref 1.005–1.030)

## 2016-11-16 LAB — CBC WITH DIFFERENTIAL/PLATELET
BASOS ABS: 0 10*3/uL (ref 0.0–0.1)
BASOS PCT: 0 %
EOS PCT: 3 %
Eosinophils Absolute: 0.2 10*3/uL (ref 0.0–0.7)
HCT: 30.7 % — ABNORMAL LOW (ref 39.0–52.0)
Hemoglobin: 9.5 g/dL — ABNORMAL LOW (ref 13.0–17.0)
LYMPHS PCT: 14 %
Lymphs Abs: 0.9 10*3/uL (ref 0.7–4.0)
MCH: 25.4 pg — ABNORMAL LOW (ref 26.0–34.0)
MCHC: 30.9 g/dL (ref 30.0–36.0)
MCV: 82.1 fL (ref 78.0–100.0)
MONO ABS: 0.6 10*3/uL (ref 0.1–1.0)
Monocytes Relative: 9 %
NEUTROS ABS: 4.9 10*3/uL (ref 1.7–7.7)
Neutrophils Relative %: 74 %
PLATELETS: 266 10*3/uL (ref 150–400)
RBC: 3.74 MIL/uL — AB (ref 4.22–5.81)
RDW: 17.8 % — AB (ref 11.5–15.5)
WBC: 6.7 10*3/uL (ref 4.0–10.5)

## 2016-11-16 LAB — IRON AND TIBC
Iron: 37 ug/dL — ABNORMAL LOW (ref 45–182)
Saturation Ratios: 13 % — ABNORMAL LOW (ref 17.9–39.5)
TIBC: 287 ug/dL (ref 250–450)
UIBC: 250 ug/dL

## 2016-11-16 LAB — VITAMIN B12: Vitamin B-12: 1293 pg/mL — ABNORMAL HIGH (ref 180–914)

## 2016-11-16 LAB — FOLATE: Folate: 26.7 ng/mL (ref >5.9)

## 2016-11-16 LAB — POC OCCULT BLOOD, ED: Fecal Occult Bld: NEGATIVE

## 2016-11-16 LAB — RETICULOCYTES
RBC.: 3.75 MIL/uL — ABNORMAL LOW (ref 4.22–5.81)
Retic Count, Absolute: 101.3 10*3/uL (ref 19.0–186.0)
Retic Ct Pct: 2.7 % (ref 0.4–3.1)

## 2016-11-16 LAB — STREP PNEUMONIAE URINARY ANTIGEN: STREP PNEUMO URINARY ANTIGEN: NEGATIVE

## 2016-11-16 LAB — FERRITIN: Ferritin: 186 ng/mL (ref 24–336)

## 2016-11-16 MED ORDER — FERROUS SULFATE 325 (65 FE) MG PO TABS
325.0000 mg | ORAL_TABLET | Freq: Three times a day (TID) | ORAL | Status: DC
  Administered 2016-11-17 – 2016-11-19 (×6): 325 mg via ORAL
  Filled 2016-11-16 (×6): qty 1

## 2016-11-16 MED ORDER — ESCITALOPRAM OXALATE 10 MG PO TABS
5.0000 mg | ORAL_TABLET | Freq: Every day | ORAL | Status: DC
  Administered 2016-11-17 – 2016-11-19 (×3): 5 mg via ORAL
  Filled 2016-11-16 (×3): qty 1

## 2016-11-16 MED ORDER — DORZOLAMIDE HCL-TIMOLOL MAL 2-0.5 % OP SOLN
1.0000 [drp] | Freq: Two times a day (BID) | OPHTHALMIC | Status: DC
  Administered 2016-11-17 – 2016-11-19 (×5): 1 [drp] via OPHTHALMIC
  Filled 2016-11-16 (×2): qty 10

## 2016-11-16 MED ORDER — ASPIRIN 81 MG PO CHEW
81.0000 mg | CHEWABLE_TABLET | Freq: Every day | ORAL | Status: DC
  Administered 2016-11-17 – 2016-11-19 (×3): 81 mg via ORAL
  Filled 2016-11-16 (×3): qty 1

## 2016-11-16 MED ORDER — VANCOMYCIN HCL IN DEXTROSE 1-5 GM/200ML-% IV SOLN
1000.0000 mg | Freq: Once | INTRAVENOUS | Status: AC
  Administered 2016-11-16: 1000 mg via INTRAVENOUS
  Filled 2016-11-16: qty 200

## 2016-11-16 MED ORDER — IOPAMIDOL (ISOVUE-300) INJECTION 61%
INTRAVENOUS | Status: AC
  Administered 2016-11-16: 75 mL
  Filled 2016-11-16: qty 75

## 2016-11-16 MED ORDER — FINASTERIDE 5 MG PO TABS
5.0000 mg | ORAL_TABLET | Freq: Every day | ORAL | Status: DC
  Administered 2016-11-17 – 2016-11-19 (×3): 5 mg via ORAL
  Filled 2016-11-16 (×3): qty 1

## 2016-11-16 MED ORDER — LEVETIRACETAM 500 MG PO TABS
500.0000 mg | ORAL_TABLET | Freq: Two times a day (BID) | ORAL | Status: DC
  Administered 2016-11-17 – 2016-11-19 (×5): 500 mg via ORAL
  Filled 2016-11-16 (×5): qty 1

## 2016-11-16 MED ORDER — DEXTROSE 5 % IV SOLN
1.0000 g | Freq: Three times a day (TID) | INTRAVENOUS | Status: DC
  Administered 2016-11-17: 1 g via INTRAVENOUS
  Filled 2016-11-16 (×2): qty 1

## 2016-11-16 MED ORDER — POLYVINYL ALCOHOL 1.4 % OP SOLN
1.0000 [drp] | Freq: Every day | OPHTHALMIC | Status: DC
  Administered 2016-11-17 – 2016-11-19 (×7): 1 [drp] via OPHTHALMIC
  Filled 2016-11-16 (×2): qty 15

## 2016-11-16 MED ORDER — POTASSIUM CHLORIDE IN NACL 40-0.9 MEQ/L-% IV SOLN
INTRAVENOUS | Status: DC
  Administered 2016-11-16 – 2016-11-19 (×2): 75 mL/h via INTRAVENOUS
  Filled 2016-11-16 (×4): qty 1000

## 2016-11-16 MED ORDER — LAMOTRIGINE 100 MG PO TABS
200.0000 mg | ORAL_TABLET | Freq: Two times a day (BID) | ORAL | Status: DC
  Administered 2016-11-17 – 2016-11-19 (×5): 200 mg via ORAL
  Filled 2016-11-16 (×5): qty 2

## 2016-11-16 MED ORDER — ADULT MULTIVITAMIN W/MINERALS CH
1.0000 | ORAL_TABLET | Freq: Every day | ORAL | Status: DC
  Administered 2016-11-17 – 2016-11-19 (×3): 1 via ORAL
  Filled 2016-11-16 (×3): qty 1

## 2016-11-16 MED ORDER — SENNOSIDES-DOCUSATE SODIUM 8.6-50 MG PO TABS
2.0000 | ORAL_TABLET | Freq: Every day | ORAL | Status: DC
  Administered 2016-11-17 – 2016-11-19 (×3): 2 via ORAL
  Filled 2016-11-16 (×3): qty 2

## 2016-11-16 MED ORDER — LATANOPROST 0.005 % OP SOLN
1.0000 [drp] | Freq: Every day | OPHTHALMIC | Status: DC
  Administered 2016-11-17: 1 [drp] via OPHTHALMIC
  Filled 2016-11-16: qty 2.5

## 2016-11-16 MED ORDER — APIXABAN 5 MG PO TABS
5.0000 mg | ORAL_TABLET | Freq: Two times a day (BID) | ORAL | Status: DC
  Administered 2016-11-17 – 2016-11-19 (×5): 5 mg via ORAL
  Filled 2016-11-16 (×5): qty 1

## 2016-11-16 MED ORDER — POTASSIUM CHLORIDE CRYS ER 20 MEQ PO TBCR
20.0000 meq | EXTENDED_RELEASE_TABLET | Freq: Once | ORAL | Status: AC
  Administered 2016-11-16: 20 meq via ORAL
  Filled 2016-11-16: qty 1

## 2016-11-16 MED ORDER — HALOPERIDOL LACTATE 5 MG/ML IJ SOLN
5.0000 mg | Freq: Once | INTRAMUSCULAR | Status: AC
  Administered 2016-11-16: 5 mg via INTRAMUSCULAR

## 2016-11-16 MED ORDER — DEXTROSE 5 % IV SOLN
2.0000 g | Freq: Once | INTRAVENOUS | Status: AC
  Administered 2016-11-16: 2 g via INTRAVENOUS
  Filled 2016-11-16: qty 2

## 2016-11-16 MED ORDER — VANCOMYCIN HCL IN DEXTROSE 750-5 MG/150ML-% IV SOLN
750.0000 mg | Freq: Two times a day (BID) | INTRAVENOUS | Status: DC
  Administered 2016-11-17: 750 mg via INTRAVENOUS
  Filled 2016-11-16: qty 150

## 2016-11-16 MED ORDER — PANTOPRAZOLE SODIUM 40 MG PO TBEC
40.0000 mg | DELAYED_RELEASE_TABLET | Freq: Every day | ORAL | Status: DC
  Administered 2016-11-17 – 2016-11-19 (×3): 40 mg via ORAL
  Filled 2016-11-16 (×3): qty 1

## 2016-11-16 NOTE — ED Provider Notes (Signed)
[facility]-EMERGENCY DEPT Provider Note   CSN: [id number] Arrival date & time: 11/16/16  1122     History   Chief Complaint Chief Complaint  Patient presents with  . Altered Mental Status    HPI [name] is a 71 y.o. male.  HPI    71 year old male presents today with altered mental status.  Patient has a significant past medical history of dementia and is unable to provide significant details.  [facility] was called who reports that over the last several weeks patient has had numerous falls.  They report patient has had decreased appetite and has not been acting himself.  He denies any acute signs of bleeding, or any infectious etiology.   Patient reports that he feels unsteady when trying to ambulate.  Denies any infectious etiology.    Past Medical History:  Diagnosis Date  . A-fib (HCC)   . BPH (benign prostatic hyperplasia)   . Coronary artery disease   . Dementia with behavioral disturbance 11/01/2015  . Glaucoma   . H/O hiatal hernia   . Hypertension   . LBBB (left bundle branch block)   . Rib fracture 09/18/2012  . Seizures (HCC)    "h/o gran mal & petit" (08/07/2012)  . Stroke [facility]) 1980's   "mini w/seizure at the same time" (08/07/2012)  . Suicidal ideation   . Syncope and collapse 08/07/2012   "w/loss of consciousness; found down; don't know how long he was out" (08/07/2012)    Patient Active Problem List   Diagnosis Date Noted  . Weight loss, non-intentional 03/03/2016  . Acute upper respiratory infection 03/03/2016  . Essential hypertension 03/03/2016  . Hyperlipidemia 03/03/2016  . Abnormal echocardiogram 03/03/2016  . Ventral hernia without obstruction or gangrene 01/26/2016  . Hypertensive heart disease 12/31/2015  . Atrial fibrillation and flutter (HCC) 12/31/2015  . Left ventricular dysfunction 12/31/2015  . CAD (coronary artery disease) 12/31/2015  . Anemia, iron deficiency 12/31/2015  . GERD without esophagitis 12/31/2015  .  Dyslipidemia 12/31/2015  . H/O: glaucoma 11/04/2015  . H/O coronary artery bypass surgery 11/04/2015  . Major depressive disorder 11/03/2015  . Dementia with behavioral disturbance 11/01/2015  . Depression, major, single episode, severe (HCC) 11/01/2015  . Seizure disorder (HCC) 08/09/2012  . BPH (benign prostatic hyperplasia)     Past Surgical History:  Procedure Laterality Date  . CORONARY ARTERY BYPASS GRAFT  ~ 2010   CABG X1  . TIBIA FRACTURE SURGERY  1960's   RLE       Home Medications    Prior to Admission medications   Medication Sig Start Date End Date Taking? Authorizing Provider  albuterol (PROVENTIL HFA;VENTOLIN HFA) 108 (90 Base) MCG/ACT inhaler Inhale 2 puffs into the lungs every 6 (six) hours as needed for wheezing or shortness of breath.   Yes Historical Provider, MD  apixaban (ELIQUIS) 5 MG TABS tablet Take 5 mg by mouth 2 (two) times daily.   Yes Historical Provider, MD  aspirin 81 MG tablet Take 81 mg by mouth daily with breakfast.   Yes Historical Provider, MD  brimonidine (ALPHAGAN) 0.2 % ophthalmic solution Place 1 drop into both eyes 2 (two) times daily. Reported on 01/05/2016   Yes Historical Provider, MD  calcium carbonate (TUMS - DOSED IN MG ELEMENTAL CALCIUM) 500 MG chewable tablet Chew 1 tablet by mouth at bedtime.   Yes Historical Provider, MD  carboxymethylcellulose 1 % ophthalmic solution Place 1 drop into both eyes 5 (five) times daily.   Yes Historical  Provider, MD  dorzolamide-timolol (COSOPT) 22.3-6.8 MG/ML ophthalmic solution Place 1 drop into both eyes 2 (two) times daily.   Yes Historical Provider, MD  escitalopram (LEXAPRO) 10 MG tablet Take 10 mg by mouth daily with breakfast.  11/17/15  Yes Historical Provider, MD  ferrous sulfate 325 (65 FE) MG tablet Take 325 mg by mouth 3 (three) times daily with meals.   Yes Historical Provider, MD  finasteride (PROSCAR) 5 MG tablet Take 5 mg by mouth daily with breakfast.    Yes Historical Provider, MD    lamoTRIgine (LAMICTAL) 200 MG tablet Take 200 mg by mouth 2 (two) times daily.   Yes Historical Provider, MD  latanoprost (XALATAN) 0.005 % ophthalmic solution Place 1 drop into both eyes at bedtime.   Yes Historical Provider, MD  levETIRAcetam (KEPPRA) 500 MG tablet Take 500 mg by mouth 2 (two) times daily.   Yes Historical Provider, MD  Multiple Vitamin (MULTIVITAMIN WITH MINERALS) TABS Take 1 tablet by mouth daily with breakfast.    Yes Historical Provider, MD  omeprazole (PRILOSEC) 20 MG capsule Take 20 mg by mouth daily with breakfast.   Yes Historical Provider, MD  pravastatin (PRAVACHOL) 40 MG tablet Take 40 mg by mouth at bedtime.   Yes Historical Provider, MD  senna-docusate (SENOKOT-S) 8.6-50 MG tablet Take 2 tablets by mouth daily with breakfast.   Yes Historical Provider, MD  traMADol (ULTRAM) 50 MG tablet Take 1 tablet (50 mg total) by mouth every 6 (six) hours as needed. Patient not taking: Reported on 11/16/2016 08/30/16   Rolan Bucco, MD    Family History Family History  Problem Relation Age of Onset  . Heart disease      No family history    Social History Social History  Substance Use Topics  . Smoking status: Never Smoker  . Smokeless tobacco: Never Used  . Alcohol use No     Allergies   Codeine   Review of Systems Review of Systems  All other systems reviewed and are negative.    Physical Exam Updated Vital Signs BP 187/92   Pulse 68   Temp 98.5 F (36.9 C) (Oral)   Resp 18   SpO2 100%   Physical Exam  Constitutional: He is oriented to person, place, and time. He appears well-developed and well-nourished.  HENT:  Head: Normocephalic and atraumatic.  Eyes: Conjunctivae are normal. Pupils are equal, round, and reactive to light. Right eye exhibits no discharge. Left eye exhibits no discharge. No scleral icterus.  Neck: Normal range of motion. No JVD present. No tracheal deviation present.  Cardiovascular: Normal rate, regular rhythm, normal  heart sounds and intact distal pulses.   No murmur heard. Pulmonary/Chest: Effort normal and breath sounds normal. No stridor. No respiratory distress. He has no wheezes. He has no rales. He exhibits no tenderness.  Musculoskeletal: He exhibits no edema.  Neurological: He is alert and oriented to person, place, and time. Coordination normal.  Psychiatric: He has a normal mood and affect. His behavior is normal. Judgment and thought content normal.  Nursing note and vitals reviewed.    ED Treatments / Results  Labs (all labs ordered are listed, but only abnormal results are displayed) Labs Reviewed  COMPREHENSIVE METABOLIC PANEL - Abnormal; Notable for the following:       Result Value   Potassium 3.0 (*)    Total Protein 8.2 (*)    ALT 12 (*)    Total Bilirubin 1.6 (*)    GFR calc non  Af Amer 59 (*)    All other components within normal limits  CBC - Abnormal; Notable for the following:    RBC 3.27 (*)    Hemoglobin 8.6 (*)    HCT 27.1 (*)    RDW 17.8 (*)    All other components within normal limits  URINALYSIS, ROUTINE W REFLEX MICROSCOPIC - Abnormal; Notable for the following:    Ketones, ur 20 (*)    Protein, ur 100 (*)    Bacteria, UA RARE (*)    Squamous Epithelial / LPF 0-5 (*)    All other components within normal limits  CULTURE, BLOOD (ROUTINE X 2)  CULTURE, BLOOD (ROUTINE X 2)  VITAMIN B12  FOLATE  IRON AND TIBC  FERRITIN  RETICULOCYTES  POC OCCULT BLOOD, ED    EKG  EKG Interpretation None       Radiology Dg Chest 2 View  Result Date: 11/16/2016 CLINICAL DATA:  Altered mental status, increase falls EXAM: CHEST  2 VIEW COMPARISON:  Portable chest x-ray of 10/31/2015 FINDINGS: The lungs remain hyperaerated. There is a vague haziness in the left upper lobe and developing pneumonia cannot be excluded. Followup chest x-ray is recommended. There is some rotation of the patient turned slightly to the right which may accentuate the superior mediastinum on the  left, but a left superior mediastinal soft tissue lesion cannot be excluded with some indentation upon the upper thoracic tracheal air shadow. CT of the chest may be helpful to assess further. This appears more prominent than noted on the prior chest x-ray. Cardiomegaly is stable as is a moderate size hiatal hernia. No acute bony abnormality is seen. 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 IMPRESSION: 1. Vague haziness in the left upper lobe. Cannot exclude developing pneumonia. Consider followup chest x-ray. 2. More soft tissue fullness to the left peritracheal region at the level of the left clavicular head may be due to rotation but a soft tissue lesion cannot be excluded. Consider CT of the chest with IV contrast. 3. Stable cardiomegaly and moderate size hiatal hernia. Electronically Signed   By: Dwyane DeePaul  Barry M.D.   On: 11/16/2016 14:36    Procedures Procedures (including critical care time)  Medications Ordered in ED Medications  ceFEPIme (MAXIPIME) 2 g in dextrose 5 % 50 mL IVPB (not administered)  potassium chloride SA (K-DUR,KLOR-CON) CR tablet 20 mEq (20 mEq Oral Given 11/16/16 1434)  iopamidol (ISOVUE-300) 61 % injection (75 mLs  Contrast Given 11/16/16 1609)     Initial Impression / Assessment and Plan / ED Course  I have reviewed the triage vital signs and the nursing notes.  Pertinent labs & imaging results that were available during my care of the patient were reviewed by me and considered in my medical decision making (see chart for details).     Final Clinical Impressions(s) / ED Diagnoses   Final diagnoses:  Encephalopathy  Anemia, unspecified type   Labs: Vitamin B12, folate, iron and TIBC, ferritin, reticulocytes, blood culture, urinalysis, CBC, CMP  Imaging: DG Chest 2 View, CT chest with contrast  Consults: [facility]  Therapeutics: Vancomycin and Zosyn, potassium  Discharge Meds:   Assessment/Plan: 71 year old male presents today with altered mental  status.  Patient has had frequent falls, changes in mental status.  His workup here is significant for 4 g drop in his hemoglobin over the last 3 months.  Patient also noted to have questionable pneumonia on chest x-ray.  Patient also had abnormalities about the clavicle that  required further evaluation with CT scan.  Due to patient's change in baseline mental status, significant hemoglobin drop, and questionable infection patient was started on antibiotics, blood cultures, anemia panel ordered, and hospitalist service consulted for observation pending workup.  Patient's power of attorney is his sister-in-law Levy Pupa [phone]  I spoke with her via phone, she reports that she recently became power of attorney after her husband passed away.  No CODE STATUS was on file, I discussed in detail her requests.  She would like him to be DNR status.  She does not feel that significant intervention including intubation, vasopressors, chest compressions, or any invasive means to be necessary.  She would like [place] to continue with medical management as needed.       New Prescriptions New Prescriptions   No medications on file     Eyvonne Mechanic, PA-C 11/16/16 1803    Eyvonne Mechanic, PA-C 11/16/16 1804    Maia Plan, MD 11/16/16 2008

## 2016-11-16 NOTE — ED Triage Notes (Signed)
Nurse from [facility] states pt has not been self for past 2 days. Nurse states pt has had increased falls for the past couple of weeks. Nurse is concerned pt has UTI.

## 2016-11-16 NOTE — Progress Notes (Signed)
Consult request has been received. CSW following up at present time. CSW attempted assessment with pt, but pt not oriented, CSW called pt's relatives for collateral information, but was unable to make contact and left VM.    Elijah PeaJonathan F. Iver Waller, Elijah MajorsLCSWA, LCAS Clinical Social Worker Ph: [phone]

## 2016-11-16 NOTE — ED Notes (Signed)
Bed: [facility]04 Expected date:  Expected time:  Means of arrival:  Comments: 71 yo m ams

## 2016-11-16 NOTE — Progress Notes (Signed)
Pharmacy Antibiotic Note  [name] is a 71 y.o. male admitted from senior living facility on 11/16/2016 with AMS and possible PNA on CXR.  Pharmacy has been consulted for vancomycin dosing.  Plan:  Vancomycin 1000 mg IV now, then 750 mg IV q12 hr; goal trough 15-20 mcg/mL  Measure vancomycin trough levels at steady state as indicated  Cefepime per MD, dosing appropriate for PNA  SCr q48 hr     Temp (24hrs), Avg:98.5 F (36.9 C), Min:98.5 F (36.9 C), Max:98.5 F (36.9 C)   Recent Labs Lab 11/16/16 1216  WBC 6.1  CREATININE 1.18    CrCl cannot be calculated (Unknown ideal weight.).    Allergies  Allergen Reactions  . Codeine Rash    Antimicrobials this admission: Vancomycin 3/14 >>  Cefepime 3/14 >>   Dose adjustments this admission: ---  Microbiology results: 3/14 BCx: sent   Thank you for allowing pharmacy to be a part of this patient's care.  Bernadene Personrew Hiroki Wint, PharmD, BCPS Pager: [phone] 11/16/2016, 4:23 PM

## 2016-11-16 NOTE — Progress Notes (Signed)
Patient became very agitated during shift change.  He yelled that he was going back to [facility] and that staff could not stop him.  Became even more agitated and physically aggressive when redirected.  Yelling profanities, hitting, biting, kicking and trying to punch staff.  On-call MD called.  Gave verbal order for haldol and non-violent restraints.  Report given to on-coming nurse.

## 2016-11-16 NOTE — H&P (Signed)
[facility] History and Physical  [name] UJW:[id number] DOB: 1946/02/07 DOA: 11/16/2016  Referring physician: ED-Mr Yetta Flock PCP: [facility]  Specialists: none  Chief Complaint: multiple  HPI:  57 ? CAD  + CABG [facility] Known Grd 2 DD Admitted for Syncopy 2014-found to have LBBB abd brady with 3 sec pauses Sz disorder BPH Htn Prior TIA Known SI after severe depression Multiple falls coming to Ed x 2 in dec 2017  Patient not particular sure why he is here in the emergency room but is able to give orientation as to where he is in terms of hospital, place and can tell me that this is March but gets the year wrong and says this 2017. There was an apparent change in mental status at senior living facility but no where he lives over the past couple of weeks and he is not really been eating or drinking as much as he used to. He is also had frequent falls as noted above  [address] on [facility] [phone]-spoke with NT-patient has been very confused and had 3 falls with 18 hours.  He has been having some diarrhea as well and usually doesn't soil himself.  He also has been laying on the floor taking his clothes-he normally has some swelling in both LE's.  He was also combative for the past couple of weeks --has been eating and drinking no weight--he has been confused over the past week. No new meds changed.  Unclear why on Apixaban--? Afib.  No bleeding    workup showed hemoglobin of 8 down from 12 in 2017 Potassium 3.0 BL/creatinine better than usual baseline 29/1. 6--12/1.1 currently Chest x-ray showed the case in this left upper lobe soft tissue fullness left paratracheal region level of the clavicular head and CT scan was ordered He has been afebrile and other than falls recently do not note any other findings  Review of Systems: The patient denies anorexia, fever, weight loss,, vision loss, decreased hearing, hoarseness, chest pain, syncope, dyspnea  on exertion, peripheral edema is however + with the R leg more swollen [chronic rods in it from [place] war]   Past Medical History:  Diagnosis Date  . A-fib (HCC)   . BPH (benign prostatic hyperplasia)   . Coronary artery disease   . Dementia with behavioral disturbance 11/01/2015  . Glaucoma   . H/O hiatal hernia   . Hypertension   . LBBB (left bundle branch block)   . Rib fracture 09/18/2012  . Seizures (HCC)    "h/o gran mal & petit" (08/07/2012)  . Stroke [facility]) 1980's   "mini w/seizure at the same time" (08/07/2012)  . Suicidal ideation   . Syncope and collapse 08/07/2012   "w/loss of consciousness; found down; don't know how long he was out" (08/07/2012)   Past Surgical History:  Procedure Laterality Date  . BRAIN SURGERY    . CORONARY ARTERY BYPASS GRAFT  ~ 2010   CABG X1  . TIBIA FRACTURE SURGERY  1960's   RLE   Social History:  Social History   Social History Narrative  . No narrative on file    Allergies  Allergen Reactions  . Codeine Rash    Family History  Problem Relation Age of Onset  . Heart disease      No family history   Prior to Admission medications   Medication Sig Start Date End Date Taking? Authorizing Provider  albuterol (PROVENTIL HFA;VENTOLIN HFA) 108 (90 Base) MCG/ACT  inhaler Inhale 2 puffs into the lungs every 6 (six) hours as needed for wheezing or shortness of breath.   Yes Historical Provider, MD  apixaban (ELIQUIS) 5 MG TABS tablet Take 5 mg by mouth 2 (two) times daily.   Yes Historical Provider, MD  aspirin 81 MG tablet Take 81 mg by mouth daily with breakfast.   Yes Historical Provider, MD  brimonidine (ALPHAGAN) 0.2 % ophthalmic solution Place 1 drop into both eyes 2 (two) times daily. Reported on 01/05/2016   Yes Historical Provider, MD  calcium carbonate (TUMS - DOSED IN MG ELEMENTAL CALCIUM) 500 MG chewable tablet Chew 1 tablet by mouth at bedtime.   Yes Historical Provider, MD  carboxymethylcellulose 1 % ophthalmic solution Place  1 drop into both eyes 5 (five) times daily.   Yes Historical Provider, MD  dorzolamide-timolol (COSOPT) 22.3-6.8 MG/ML ophthalmic solution Place 1 drop into both eyes 2 (two) times daily.   Yes Historical Provider, MD  escitalopram (LEXAPRO) 10 MG tablet Take 10 mg by mouth daily with breakfast.  11/17/15  Yes Historical Provider, MD  ferrous sulfate 325 (65 FE) MG tablet Take 325 mg by mouth 3 (three) times daily with meals.   Yes Historical Provider, MD  finasteride (PROSCAR) 5 MG tablet Take 5 mg by mouth daily with breakfast.    Yes Historical Provider, MD  lamoTRIgine (LAMICTAL) 200 MG tablet Take 200 mg by mouth 2 (two) times daily.   Yes Historical Provider, MD  latanoprost (XALATAN) 0.005 % ophthalmic solution Place 1 drop into both eyes at bedtime.   Yes Historical Provider, MD  levETIRAcetam (KEPPRA) 500 MG tablet Take 500 mg by mouth 2 (two) times daily.   Yes Historical Provider, MD  Multiple Vitamin (MULTIVITAMIN WITH MINERALS) TABS Take 1 tablet by mouth daily with breakfast.    Yes Historical Provider, MD  omeprazole (PRILOSEC) 20 MG capsule Take 20 mg by mouth daily with breakfast.   Yes Historical Provider, MD  pravastatin (PRAVACHOL) 40 MG tablet Take 40 mg by mouth at bedtime.   Yes Historical Provider, MD  senna-docusate (SENOKOT-S) 8.6-50 MG tablet Take 2 tablets by mouth daily with breakfast.   Yes Historical Provider, MD  traMADol (ULTRAM) 50 MG tablet Take 1 tablet (50 mg total) by mouth every 6 (six) hours as needed. Patient not taking: Reported on 11/16/2016 08/30/16   Rolan Bucco, MD   Physical Exam: Vitals:   11/16/16 1442 11/16/16 1500 11/16/16 1530 11/16/16 1600  BP:  192/91 (!) 204/98 (!) 201/94  Pulse:  72 77 73  Resp: 18     Temp:      TempSrc:      SpO2:  100% 96% 93%    On exam alert pleasant some rigidity to fingers smile symmetric extraocular movements intact first happened closed mild JVD S1-S2 no murmur rub or gallop chest is clinically clear no added  sound abdomen is soft with the small hernia around the xiphisternum no abdominal tenderness he has grade 2 lower extremity edema which is asymmetric and larger on the right side he has a wound over the right shin as well7  Labs on Admission:  Basic Metabolic Panel:  Recent Labs Lab 11/16/16 1216  NA 141  K 3.0*  CL 106  CO2 25  GLUCOSE 87  BUN 12  CREATININE 1.18  CALCIUM 8.9   Liver Function Tests:  Recent Labs Lab 11/16/16 1216  AST 25  ALT 12*  ALKPHOS 87  BILITOT 1.6*  PROT 8.2*  ALBUMIN 3.9   No results for input(s): LIPASE, AMYLASE in the last 168 hours. No results for input(s): AMMONIA in the last 168 hours. CBC:  Recent Labs Lab 11/16/16 1216  WBC 6.1  HGB 8.6*  HCT 27.1*  MCV 82.9  PLT 266   Cardiac Enzymes: No results for input(s): CKTOTAL, CKMB, CKMBINDEX, TROPONINI in the last 168 hours.  BNP (last 3 results) No results for input(s): BNP in the last 8760 hours.  ProBNP (last 3 results) No results for input(s): PROBNP in the last 8760 hours.  CBG: No results for input(s): GLUCAP in the last 168 hours.  Radiological Exams on Admission: Dg Chest 2 View  Result Date: 11/16/2016 CLINICAL DATA:  Altered mental status, increase falls EXAM: CHEST  2 VIEW COMPARISON:  Portable chest x-ray of 10/31/2015 FINDINGS: The lungs remain hyperaerated. There is a vague haziness in the left upper lobe and developing pneumonia cannot be excluded. Followup chest x-ray is recommended. There is some rotation of the patient turned slightly to the right which may accentuate the superior mediastinum on the left, but a left superior mediastinal soft tissue lesion cannot be excluded with some indentation upon the upper thoracic tracheal air shadow. CT of the chest may be helpful to assess further. This appears more prominent than noted on the prior chest x-ray. Cardiomegaly is stable as is a moderate size hiatal hernia. No acute bony abnormality is seen. Median sternotomy  sutures are noted from prior CABG. IMPRESSION: 1. Vague haziness in the left upper lobe. Cannot exclude developing pneumonia. Consider followup chest x-ray. 2. More soft tissue fullness to the left peritracheal region at the level of the left clavicular head may be due to rotation but a soft tissue lesion cannot be excluded. Consider CT of the chest with IV contrast. 3. Stable cardiomegaly and moderate size hiatal hernia. Electronically Signed   By: Dwyane DeePaul  Barry M.D.   On: 11/16/2016 14:36   Ct Chest W Contrast  Result Date: 11/16/2016 CLINICAL DATA:  Abnormal chest x-ray.  Rule out pneumonia. EXAM: CT CHEST WITH CONTRAST TECHNIQUE: Multidetector CT imaging of the chest was performed during intravenous contrast administration. CONTRAST:  100 ISOVUE-300 IOPAMIDOL (ISOVUE-300) INJECTION 61% COMPARISON:  11/16/2016.  Chest CT 11/16/2006 FINDINGS: Cardiovascular: Cardiomegaly. Aorta is normal caliber. Mildly prominent main pulmonary artery, 3.5 cm. Prior CABG. Mediastinum/Nodes: Borderline sized mediastinal lymph nodes. AP window lymph node has a short axis diameter of 13 mm. Right paratracheal lymph node has a short axis diameter of 10 mm. Right paratracheal node more inferiorly has a short axis diameter of 14 mm. No hilar or axillary adenopathy. Large hiatal hernia. Lungs/Pleura: There is vascular congestion. Trace left pleural effusion. No confluent opacities to suggest pneumonia. In particular, no evidence for pneumonia in the left upper lobe. No suspicious pulmonary nodules. Upper Abdomen: Imaging into the upper abdomen shows no acute findings. Mild diffuse fatty infiltration of the liver. Musculoskeletal: Chest wall soft tissues are unremarkable. No acute bony abnormality. IMPRESSION: Cardiomegaly with vascular congestion. Mildly enlarged pulmonary artery suggests pulmonary arterial hypertension. Prior CABG. Trace left pleural effusion. No evidence of pneumonia. Borderline sized mediastinal lymph nodes, likely  reactive. Fatty infiltration of the liver. Large hiatal hernia. Electronically Signed   By: Charlett NoseKevin  Dover M.D.   On: 11/16/2016 16:30    EKG: Independently reviewed. reviewed  Assessment/Plan Active Problems:   Toxic metabolic encephalopathy    Toxic metabolic encephalopathy-unclear etiology-patient is on multiple medications which can cause confusion. Additionally he might have a pneumonia  and we are getting a CT of the chest and cefepime and vancomycin have been started Polypharmacy-holding Ultram 50, Pravachol 40, cutting back Lexapro from 10-5 mg every morning and we will monitor history of CAD status post CABG and [facility] unclear time frame-continue aspirin. Anemia unclear etiology-iron studies have been ordered in the emergency room we will follow. Hemoccult was negative. He is on 3 times a day and sulfate which is probably intolerable and will cause dark stool. He may benefit from IV iron. History of seizures-is on Lamictal 200 3 times a day we will inquire with nursing facility if he is had any recent seizures. He has been on Dilantin in the past. Questionable COPD/asthma is on albuterol inhalers which can be continued if he needs it we will hold for now Osteoporosis hold aspart carbonate 500 daily at bedtime for now BPH continue finasteride 5 mg every morning Mild hypokalemia give IV fluid with 75 cc of saline and recheck labs in a.m. ? Afib, [place] score >3-cont Apixaban Frequent falls -for his debility we will have ET evaluate     Code Status: DNAR  Family Communication: none bedsdie --long discussion with [facility] staff Disposition Plan: obs  Time spent: 26  Mahala Menghini [facility] Pager [phone]  If 7PM-7AM, please contact night-coverage [email or web address] [email or web address] 11/16/2016, 4:54 PM

## 2016-11-16 NOTE — Progress Notes (Signed)
Consult request has been received. CSW following up at present time.  Adrain Butrick F. Harsha Yusko, LCSWA, LCAS Clinical Social Worker Ph: [phone]  

## 2016-11-16 NOTE — ED Triage Notes (Signed)
Pt arrives via EMS [facility].  Pt has a hx of dementia and kidney failure.  Staff at facility stated that pt has had increased confusion since December, but especially in the last two weeks. Pt has also had increased falls x 2 weeks.  Staff says he is "fixated on falls" and "blames them on [place] in [place]," but that they are concerned about his kidney function. Pt has been combative and uncooperative with EMS.  Pt presents with some small abrasions on arms and wrists.

## 2016-11-17 DIAGNOSIS — M810 Age-related osteoporosis without current pathological fracture: Secondary | ICD-10-CM | POA: Diagnosis present

## 2016-11-17 DIAGNOSIS — E876 Hypokalemia: Secondary | ICD-10-CM | POA: Diagnosis present

## 2016-11-17 DIAGNOSIS — D649 Anemia, unspecified: Secondary | ICD-10-CM | POA: Diagnosis present

## 2016-11-17 DIAGNOSIS — G92 Toxic encephalopathy: Secondary | ICD-10-CM | POA: Diagnosis present

## 2016-11-17 DIAGNOSIS — J449 Chronic obstructive pulmonary disease, unspecified: Secondary | ICD-10-CM | POA: Diagnosis present

## 2016-11-17 DIAGNOSIS — G40909 Epilepsy, unspecified, not intractable, without status epilepticus: Secondary | ICD-10-CM | POA: Diagnosis present

## 2016-11-17 DIAGNOSIS — Z7982 Long term (current) use of aspirin: Secondary | ICD-10-CM | POA: Diagnosis not present

## 2016-11-17 DIAGNOSIS — I2510 Atherosclerotic heart disease of native coronary artery without angina pectoris: Secondary | ICD-10-CM | POA: Diagnosis present

## 2016-11-17 DIAGNOSIS — Z885 Allergy status to narcotic agent status: Secondary | ICD-10-CM | POA: Diagnosis not present

## 2016-11-17 DIAGNOSIS — Z66 Do not resuscitate: Secondary | ICD-10-CM | POA: Diagnosis present

## 2016-11-17 DIAGNOSIS — Z7901 Long term (current) use of anticoagulants: Secondary | ICD-10-CM | POA: Diagnosis not present

## 2016-11-17 DIAGNOSIS — H409 Unspecified glaucoma: Secondary | ICD-10-CM | POA: Diagnosis present

## 2016-11-17 DIAGNOSIS — F0391 Unspecified dementia with behavioral disturbance: Secondary | ICD-10-CM | POA: Diagnosis present

## 2016-11-17 DIAGNOSIS — Z8673 Personal history of transient ischemic attack (TIA), and cerebral infarction without residual deficits: Secondary | ICD-10-CM | POA: Diagnosis not present

## 2016-11-17 DIAGNOSIS — R296 Repeated falls: Secondary | ICD-10-CM | POA: Diagnosis present

## 2016-11-17 DIAGNOSIS — I4891 Unspecified atrial fibrillation: Secondary | ICD-10-CM | POA: Diagnosis present

## 2016-11-17 DIAGNOSIS — N400 Enlarged prostate without lower urinary tract symptoms: Secondary | ICD-10-CM | POA: Diagnosis present

## 2016-11-17 DIAGNOSIS — I447 Left bundle-branch block, unspecified: Secondary | ICD-10-CM | POA: Diagnosis present

## 2016-11-17 DIAGNOSIS — G9340 Encephalopathy, unspecified: Secondary | ICD-10-CM | POA: Diagnosis not present

## 2016-11-17 DIAGNOSIS — Z8659 Personal history of other mental and behavioral disorders: Secondary | ICD-10-CM | POA: Diagnosis not present

## 2016-11-17 DIAGNOSIS — Z79899 Other long term (current) drug therapy: Secondary | ICD-10-CM | POA: Diagnosis not present

## 2016-11-17 DIAGNOSIS — Z951 Presence of aortocoronary bypass graft: Secondary | ICD-10-CM | POA: Diagnosis not present

## 2016-11-17 LAB — MRSA PCR SCREENING: MRSA by PCR: NEGATIVE

## 2016-11-17 LAB — COMPREHENSIVE METABOLIC PANEL
ALBUMIN: 3.4 g/dL — AB (ref 3.5–5.0)
ALK PHOS: 77 U/L (ref 38–126)
ALT: 10 U/L — ABNORMAL LOW (ref 17–63)
ANION GAP: 8 (ref 5–15)
AST: 25 U/L (ref 15–41)
BUN: 10 mg/dL (ref 6–20)
CALCIUM: 8.6 mg/dL — AB (ref 8.9–10.3)
CO2: 25 mmol/L (ref 22–32)
Chloride: 109 mmol/L (ref 101–111)
Creatinine, Ser: 0.99 mg/dL (ref 0.61–1.24)
GFR calc non Af Amer: >60 mL/min (ref >60)
Glucose, Bld: 94 mg/dL (ref 65–99)
POTASSIUM: 2.9 mmol/L — AB (ref 3.5–5.1)
Sodium: 142 mmol/L (ref 135–145)
TOTAL PROTEIN: 7.2 g/dL (ref 6.5–8.1)
Total Bilirubin: 1.6 mg/dL — ABNORMAL HIGH (ref 0.3–1.2)

## 2016-11-17 LAB — CREATININE, SERUM: Creatinine, Ser: 1 mg/dL (ref 0.61–1.24)

## 2016-11-17 LAB — HIV ANTIBODY (ROUTINE TESTING W REFLEX): HIV Screen 4th Generation wRfx: NONREACTIVE

## 2016-11-17 MED ORDER — SODIUM CHLORIDE 0.9 % IV SOLN
30.0000 meq | Freq: Two times a day (BID) | INTRAVENOUS | Status: DC
  Administered 2016-11-17 – 2016-11-18 (×4): 30 meq via INTRAVENOUS
  Filled 2016-11-17 (×5): qty 15

## 2016-11-17 MED ORDER — SODIUM CHLORIDE 0.9 % IV SOLN: 30.0000 meq | Freq: Two times a day (BID) | INTRAVENOUS | Status: DC

## 2016-11-17 MED ORDER — MORPHINE SULFATE (PF) 4 MG/ML IV SOLN
4.0000 mg | Freq: Once | INTRAVENOUS | Status: AC
  Administered 2016-11-17: 4 mg via INTRAVENOUS
  Filled 2016-11-17: qty 1

## 2016-11-17 NOTE — Evaluation (Signed)
Physical Therapy Evaluation Patient Details Name: [name] MRN: [id number] DOB: 08-11-1946 Today's Date: 11/17/2016   History of Present Illness  Pt is a 71 year old male admitted with Toxic metabolic encephalopathy with PMHx significant for syncope, stroke, seizures, dementia, R tibia fracture surgery - 1960s  Clinical Impression  Pt admitted with above diagnosis. Pt currently with functional limitations due to the deficits listed below (see PT Problem List).  Pt will benefit from skilled PT to increase their independence and safety with mobility to allow discharge to the venue listed below.  Pt assisted with ambulating in hallway however only able to tolerate short distance and requires at least min assist for mobility at this time.  If facility cannot provide current level of care, pt may need ST-SNF upon d/c.     Follow Up Recommendations SNF;Supervision/Assistance - 24 hour (unless facility can provide current level of care then HHPT)    Equipment Recommendations  None recommended by PT    Recommendations for Other Services       Precautions / Restrictions Precautions Precautions: Fall      Mobility  Bed Mobility Overal bed mobility: Needs Assistance Bed Mobility: Supine to Sit;Sit to Supine     Supine to sit: Supervision Sit to supine: Supervision   General bed mobility comments: increased time  Transfers Overall transfer level: Needs assistance Equipment used: Rolling walker (2 wheeled) Transfers: Sit to/from Stand Sit to Stand: Min assist         General transfer comment: assist to rise and steady, verbal cues for hand placement  Ambulation/Gait Ambulation/Gait assistance: Min assist Ambulation Distance (Feet): 16 Feet Assistive device: Rolling walker (2 wheeled) Gait Pattern/deviations: Step-through pattern;Decreased stride length     General Gait Details: assist to steady, fatigued quickly  Stairs            Wheelchair Mobility     Modified Rankin (Stroke Patients Only)       Balance Overall balance assessment: History of Falls                                           Pertinent Vitals/Pain Pain Assessment: No/denies pain    Home Living Family/patient expects to be discharged to:: Assisted living               Home Equipment: Walker - 2 wheels      Prior Function Level of Independence: Independent with assistive device(s)         Comments: pt reports being ambulatory with RW, sometimes uses w/c     Hand Dominance        Extremity/Trunk Assessment        Lower Extremity Assessment Lower Extremity Assessment: Generalized weakness;RLE deficits/detail RLE Deficits / Details: lower leg deformity - s/p tibial surgery       Communication   Communication: Expressive difficulties (difficult to understand at times)  Cognition Arousal/Alertness: Awake/alert Behavior During Therapy: WFL for tasks assessed/performed Overall Cognitive Status: History of cognitive impairments - at baseline                 General Comments: pleasant and cooperative this morning    General Comments      Exercises     Assessment/Plan    PT Assessment Patient needs continued PT services  PT Problem List Decreased strength;Decreased activity tolerance;Decreased balance;Decreased mobility;Decreased safety awareness  PT Treatment Interventions DME instruction;Gait training;Functional mobility training;Therapeutic exercise;Therapeutic activities;Patient/family education;Balance training    PT Goals (Current goals can be found in the Care Plan section)  Acute Rehab PT Goals PT Goal Formulation: With patient Time For Goal Achievement: 12/01/16 Potential to Achieve Goals: Good    Frequency Min 3X/week   Barriers to discharge        Co-evaluation               End of Session Equipment Utilized During Treatment: Gait belt Activity Tolerance: Patient limited by  fatigue Patient left: in bed;with call bell/phone within reach;with family/visitor present;with bed alarm set Nurse Communication: Mobility status PT Visit Diagnosis: Difficulty in walking, not elsewhere classified (R26.2)    Functional Assessment Tool Used: AM-PAC 6 Clicks Basic Mobility;Clinical judgement Functional Limitation: Mobility: Walking and moving around Mobility: Walking and Moving Around Current Status (Z6109): At least 20 percent but less than 40 percent impaired, limited or restricted Mobility: Walking and Moving Around Goal Status [phone]): At least 1 percent but less than 20 percent impaired, limited or restricted    Time: 0956-1011 PT Time Calculation (min) (ACUTE ONLY): 15 min   Charges:   PT Evaluation $PT Eval Low Complexity: 1 Procedure     PT G Codes:   PT G-Codes **NOT FOR INPATIENT CLASS** Functional Assessment Tool Used: AM-PAC 6 Clicks Basic Mobility;Clinical judgement Functional Limitation: Mobility: Walking and moving around Mobility: Walking and Moving Around Current Status (U9811): At least 20 percent but less than 40 percent impaired, limited or restricted Mobility: Walking and Moving Around Goal Status [phone]): At least 1 percent but less than 20 percent impaired, limited or restricted     Birda Didonato,KATHrine E 11/17/2016, 11:40 AM Zenovia Jarred, PT, DPT 11/17/2016 Pager: [phone]

## 2016-11-17 NOTE — Plan of Care (Signed)
Problem: Safety: Goal: Ability to remain free from injury will improve Outcome: Not Met (add Reason) Patient has poor judgment and is impulsive secondary to history of dementia and acute metabolic encephalopathy. Patient requiring restraints due to agitation and risk of injuring himself or others. Will continue to assess and monitor for change in status.

## 2016-11-17 NOTE — Progress Notes (Signed)
PROGRESS NOTE    [name]  ZOX:[id number] DOB: 07/05/1946 DOA: 11/16/2016 [facility]: [facility]  Outpatient Specialists:     Brief Narrative:  60 ? CAD  + CABG [place] [facility] Known Grd 2 DD Admitted for Syncopy 2014-found to have LBBB abd brady with 3 sec pauses Sz disorder BPH Htn Prior TIA Known SI after severe depression Multiple falls coming to Ed x 2 in dec 2017  Admitted with metabolic encephalopathy and anemia 3.15.18 and concerns for PNA on cxr which were not found on CT chest   Assessment & Plan:   Active Problems:   Toxic metabolic encephalopathy   I Toxic metabolic encephalopathy-unclear etiology-patient is on multiple medications which can cause confusion.  Pneumonia not seen on CT of the chest -d/c cefepime and vancomycin-monitor if any change in status II Polypharmacy-holding Ultram 50, Pravachol 40, cutting back Lexapro from 10-5 mg every morning and we will monitor history of CAD status post CABG and [place] [facility] unclear time frame-continue aspirin. III Anemia unclear etiology-iron studies have been ordered in the emergency room we will follow. Hemoccult was negative. He is on 3 times a day and sulfate which is probably intolerable and will cause dark stool. He may benefit from IV iron. IV History of seizures-is on Lamictal 200 3 times a day.He has been on Dilantin in the past. V Questionable COPD/asthma is on albuterol inhalers which can be continued if he needs it we will hold for now VI Osteoporosis hold aspart carbonate 500 daily at bedtime for now VII BPH continue finasteride 5 mg every morning VIII Mild hypokalemia give IV fluid with 75 cc of saline -Added runs KCL IV 30 bid 3/15 IX ? Afib, [place] score >3-cont Apixaban X Frequent falls -for his debility we will have ET evaluate   D/w gradndaughter  Consultants:   none  Procedures:   none  Antimicrobials:   vanc and cefepime d/c 3/15    Subjective:  Agitated overnight No  imoproved but sleepy No further outbursts Not orienting at bedside but sleepy  Objective: Vitals:   11/16/16 1851 11/16/16 1900 11/16/16 2300 11/17/16 0445  BP: (!) 177/72  (!) 164/71 (!) 167/93  Pulse: 75  70 72  Resp: 18  18   Temp: 98.9 F (37.2 C)  97.7 F (36.5 C) 98.5 F (36.9 C)  TempSrc: Axillary  Axillary Axillary  SpO2: 97%  97% 98%  Weight:  69 kg (152 lb 1.9 oz)      Intake/Output Summary (Last 24 hours) at 11/17/16 0805 Last data filed at 11/17/16 0609  Gross per 24 hour  Intake           738.75 ml  Output                0 ml  Net           738.75 ml   Filed Weights   11/16/16 1900  Weight: 69 kg (152 lb 1.9 oz)    Examination:  On exam sleepy   mild JVD S1-S2 no murmur rub or gallop   chest is clinically clear no added sound  abdomen is soft with the small hernia around the xiphisternum no abdominal tenderness  he has grade 1 lower extremity edema which is asymmetric   Data Reviewed: I have personally reviewed following labs and imaging studies  CBC:  Recent Labs Lab 11/16/16 1216 11/16/16 2334  WBC 6.1 6.7  NEUTROABS  --  4.9  HGB 8.6* 9.5*  HCT 27.1* 30.7*  MCV 82.9 82.1  PLT 266 266   Basic Metabolic Panel:  Recent Labs Lab 11/16/16 1216 11/16/16 2334 11/17/16 0556  NA 141  --  142  K 3.0*  --  2.9*  CL 106  --  109  CO2 25  --  25  GLUCOSE 87  --  94  BUN 12  --  10  CREATININE 1.18 1.00 0.99  CALCIUM 8.9  --  8.6*   GFR: Estimated Creatinine Clearance: 62.1 mL/min (by C-G formula based on SCr of 0.99 mg/dL). Liver Function Tests:  Recent Labs Lab 11/16/16 1216 11/17/16 0556  AST 25 25  ALT 12* 10*  ALKPHOS 87 77  BILITOT 1.6* 1.6*  PROT 8.2* 7.2  ALBUMIN 3.9 3.4*   No results for input(s): LIPASE, AMYLASE in the last 168 hours. No results for input(s): AMMONIA in the last 168 hours. Coagulation Profile: No results for input(s): INR, PROTIME in the last 168 hours. Cardiac Enzymes: No results for input(s):  CKTOTAL, CKMB, CKMBINDEX, TROPONINI in the last 168 hours. BNP (last 3 results) No results for input(s): PROBNP in the last 8760 hours. HbA1C: No results for input(s): HGBA1C in the last 72 hours. CBG: No results for input(s): GLUCAP in the last 168 hours. Lipid Profile: No results for input(s): CHOL, HDL, LDLCALC, TRIG, CHOLHDL, LDLDIRECT in the last 72 hours. Thyroid Function Tests: No results for input(s): TSH, T4TOTAL, FREET4, T3FREE, THYROIDAB in the last 72 hours. Anemia Panel:  Recent Labs  11/16/16 1624  VITAMINB12 1,293*  FOLATE 26.7  FERRITIN 186  TIBC 287  IRON 37*  RETICCTPCT 2.7   Urine analysis:    Component Value Date/Time   COLORURINE YELLOW 11/16/2016 1440   APPEARANCEUR CLEAR 11/16/2016 1440   LABSPEC 1.017 11/16/2016 1440   PHURINE 5.0 11/16/2016 1440   GLUCOSEU NEGATIVE 11/16/2016 1440   HGBUR NEGATIVE 11/16/2016 1440   BILIRUBINUR NEGATIVE 11/16/2016 1440   KETONESUR 20 (A) 11/16/2016 1440   PROTEINUR 100 (A) 11/16/2016 1440   UROBILINOGEN 0.2 02/28/2013 1845   NITRITE NEGATIVE 11/16/2016 1440   LEUKOCYTESUR NEGATIVE 11/16/2016 1440   Sepsis Labs: @LABRCNTIP (procalcitonin:4,lacticidven:4)  ) Recent Results (from the past 240 hour(s))  Culture, blood (routine x 2) Call MD if unable to obtain prior to antibiotics being given     Status: None (Preliminary result)   Collection Time: 11/16/16  8:20 PM  Result Value Ref Range Status   Specimen Description BLOOD RIGHT HAND  Final   Special Requests IN PEDIATRIC BOTTLE 5CC  Final   Culture PENDING  Incomplete   Report Status PENDING  Incomplete  MRSA PCR Screening     Status: None   Collection Time: 11/17/16 12:40 AM  Result Value Ref Range Status   MRSA by PCR NEGATIVE NEGATIVE Final    Comment:        The GeneXpert MRSA Assay (FDA approved for NASAL specimens only), is one component of a comprehensive MRSA colonization surveillance program. It is not intended to diagnose MRSA infection nor  to guide or monitor treatment for MRSA infections.          Radiology Studies: Dg Chest 2 View  Result Date: 11/16/2016 CLINICAL DATA:  Altered mental status, increase falls EXAM: CHEST  2 VIEW COMPARISON:  Portable chest x-ray of 10/31/2015 FINDINGS: The lungs remain hyperaerated. There is a vague haziness in the left upper lobe and developing pneumonia cannot be excluded. Followup chest x-ray is recommended. There is some rotation of the  patient turned slightly to the right which may accentuate the superior mediastinum on the left, but a left superior mediastinal soft tissue lesion cannot be excluded with some indentation upon the upper thoracic tracheal air shadow. CT of the chest may be helpful to assess further. This appears more prominent than noted on the prior chest x-ray. Cardiomegaly is stable as is a moderate size hiatal hernia. No acute bony abnormality is seen. 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 IMPRESSION: 1. Vague haziness in the left upper lobe. Cannot exclude developing pneumonia. Consider followup chest x-ray. 2. More soft tissue fullness to the left peritracheal region at the level of the left clavicular head may be due to rotation but a soft tissue lesion cannot be excluded. Consider CT of the chest with IV contrast. 3. Stable cardiomegaly and moderate size hiatal hernia. Electronically Signed   By: Dwyane Dee M.D.   On: 11/16/2016 14:36   Ct Chest W Contrast  Result Date: 11/16/2016 CLINICAL DATA:  Abnormal chest x-ray.  Rule out pneumonia. EXAM: CT CHEST WITH CONTRAST TECHNIQUE: Multidetector CT imaging of the chest was performed during intravenous contrast administration. CONTRAST:  100 ISOVUE-300 IOPAMIDOL (ISOVUE-300) INJECTION 61% COMPARISON:  11/16/2016.  Chest CT 11/16/2006 FINDINGS: Cardiovascular: Cardiomegaly. Aorta is normal caliber. Mildly prominent main pulmonary artery, 3.5 cm. Prior CABG. Mediastinum/Nodes: Borderline sized mediastinal lymph  nodes. AP window lymph node has a short axis diameter of 13 mm. Right paratracheal lymph node has a short axis diameter of 10 mm. Right paratracheal node more inferiorly has a short axis diameter of 14 mm. No hilar or axillary adenopathy. Large hiatal hernia. Lungs/Pleura: There is vascular congestion. Trace left pleural effusion. No confluent opacities to suggest pneumonia. In particular, no evidence for pneumonia in the left upper lobe. No suspicious pulmonary nodules. Upper Abdomen: Imaging into the upper abdomen shows no acute findings. Mild diffuse fatty infiltration of the liver. Musculoskeletal: Chest wall soft tissues are unremarkable. No acute bony abnormality. IMPRESSION: Cardiomegaly with vascular congestion. Mildly enlarged pulmonary artery suggests pulmonary arterial hypertension. Prior CABG. Trace left pleural effusion. No evidence of pneumonia. Borderline sized mediastinal lymph nodes, likely reactive. Fatty infiltration of the liver. Large hiatal hernia. Electronically Signed   By: Charlett Nose M.D.   On: 11/16/2016 16:30        Scheduled Meds: . apixaban  5 mg Oral BID  . aspirin  81 mg Oral Q breakfast  . ceFEPime (MAXIPIME) IV  1 g Intravenous Q8H  . dorzolamide-timolol  1 drop Both Eyes BID  . escitalopram  5 mg Oral Q breakfast  . ferrous sulfate  325 mg Oral TID WC  . finasteride  5 mg Oral Q breakfast  . lamoTRIgine  200 mg Oral BID  . latanoprost  1 drop Both Eyes QHS  . levETIRAcetam  500 mg Oral BID  . multivitamin with minerals  1 tablet Oral Q breakfast  . pantoprazole  40 mg Oral Daily  . polyvinyl alcohol  1 drop Both Eyes 5 X Daily  . senna-docusate  2 tablet Oral Q breakfast  . vancomycin  750 mg Intravenous Q12H   Continuous Infusions: . 0.9 % NaCl with KCl 40 mEq / L 75 mL/hr (11/16/16 2258)     LOS: 0 days    Time spent: 44    Pleas Koch, MD [facility] ([facility]   If 7PM-7AM, please contact night-coverage [email or web address] Password  [email or web address] 11/17/2016, 8:05 AM

## 2016-11-18 ENCOUNTER — Encounter (HOSPITAL_COMMUNITY): Payer: Self-pay | Admitting: Student

## 2016-11-18 DIAGNOSIS — G9340 Encephalopathy, unspecified: Secondary | ICD-10-CM

## 2016-11-18 LAB — BASIC METABOLIC PANEL
ANION GAP: 9 (ref 5–15)
BUN: 9 mg/dL (ref 6–20)
CO2: 22 mmol/L (ref 22–32)
Calcium: 8.7 mg/dL — ABNORMAL LOW (ref 8.9–10.3)
Chloride: 112 mmol/L — ABNORMAL HIGH (ref 101–111)
Creatinine, Ser: 1.13 mg/dL (ref 0.61–1.24)
GLUCOSE: 88 mg/dL (ref 65–99)
POTASSIUM: 3.8 mmol/L (ref 3.5–5.1)
Sodium: 143 mmol/L (ref 135–145)

## 2016-11-18 LAB — CREATININE, SERUM
Creatinine, Ser: 1.1 mg/dL (ref 0.61–1.24)
GFR calc non Af Amer: >60 mL/min (ref >60)

## 2016-11-18 MED ORDER — ESCITALOPRAM OXALATE 5 MG PO TABS: 5.0000 mg | ORAL_TABLET | Freq: Every day | ORAL | 0 refills | Status: DC

## 2016-11-18 NOTE — Progress Notes (Signed)
Have tried to call patient's family multiple times this shift r/t patient's discharge.  No response.  Administrative Coordinator aware.  Will continue trying to contact family.

## 2016-11-18 NOTE — Discharge Summary (Signed)
Physician Discharge Summary  [name] [id number] DOB: Jul 24, 1946 DOA: 11/16/2016  [facility]  Admit date: 11/16/2016 Discharge date: 11/18/2016  Time spent: 40 minutes  Recommendations for Outpatient Follow-up:  1. See MAR changes-cut back lexapro, d/c tramadol on d/c 2. Needs OP supervision and labs for hemoglobin 3. [facility] for home care as NOK wishes he have this instead of SNF placement 4. Needs dementia screening and GOC as OP  Discharge Diagnoses:  Active Problems:   Toxic metabolic encephalopathy   Discharge Condition:  improved  Diet recommendation: hh  Filed Weights   11/16/16 1900 11/17/16 1545  Weight: 69 kg (152 lb 1.9 oz) 69 kg (152 lb 1.9 oz)    History of present illness:  [age] ? CAD + [facility] [place] [facility] Known Grd [facility] DD Admitted for Syncopy 2014-found to have LBBB abd brady with 3 sec pauses Sz disorder BPH Htn Prior TIA Known SI after severe depression Multiple falls coming to Ed x 2 in dec 2017  Admitted with metabolic encephalopathy and anemia 3.15.18 and concerns for PNA on cxr which were not found on CT chest   Hospital Course:  I           Toxic metabolic encephalopathy-unclear etiology-patient is on multiple medications which can cause confusion.  Pneumonia not seen on CT of the chest -d/c cefepime and vancomycin-no acute fever nor chills II          Polypharmacy-holding Ultram 50, Pravachol 40, cutting back Lexapro from 10-5 mg every morning and we will monitor history of CAD status post CABG and [place] [facility] unclear time frame-continue aspirin. III         Anemia unclear etiology-iron studies have been ordered in the emergency room we will follow. Hemoccult was negative. He is on 3 times a day and sulfate which is probably intolerable and will cause dark stool. He may benefit from IV iron in the future and should have labs in 1 week at pcp offcie IV         History of seizures-is on Lamictal 200 3 times a  day.He has been on Dilantin in the past. V          Questionable COPD/asthma is on albuterol inhalers which can be continued if he needs it we will hold for now VI         Osteoporosis hold aspart carbonate 500 daily at bedtime for now-resumed on d/c VII        BPH continue finasteride 5 mg every morning VIII       Mild hypokalemia give IV fluid with 75 cc of saline -Added runs KCL IV 30 bid 3/15--resolved on d/c IX         ? Afib, [place] score >3-was on Apixaban--risks and benefits of significant bleeding discussed with family-consensus-would benefit from mainly asa and AC too high risk in terms of fall ris X          Frequent falls -for his debility PT did evaluate--familly taking home-max support at home requested    Discharge Exam: Vitals:   11/17/16 1959 11/18/16 0520  BP: (!) 166/77 (!) 163/69  Pulse: 66 66  Resp: 18 17  Temp: 98.4 F (36.9 C) 98.7 F (37.1 C)    General: alert more oriented in nad Cardiovascular:  s1s 2 no m/r/g Respiratory: clear no added sound  Discharge Instructions   Discharge Instructions    Diet - low sodium heart healthy  Complete by:  As directed    Discharge instructions    Complete by:  As directed    See medicine changes We will order home health No more apixaban-only aspirin   Increase activity slowly    Complete by:  As directed      Current Discharge Medication List    CONTINUE these medications which have CHANGED   Details  escitalopram (LEXAPRO) 5 MG tablet Take 1 tablet (5 mg total) by mouth daily with breakfast. Qty: 30 tablet, Refills: 0      CONTINUE these medications which have NOT CHANGED   Details  albuterol (PROVENTIL HFA;VENTOLIN HFA) 108 (90 Base) MCG/ACT inhaler Inhale 2 puffs into the lungs every 6 (six) hours as needed for wheezing or shortness of breath.    aspirin 81 MG tablet Take 81 mg by mouth daily with breakfast.    brimonidine (ALPHAGAN) 0.2 % ophthalmic solution Place 1 drop into both eyes 2 (two)  times daily. Reported on 01/05/2016    calcium carbonate (TUMS - DOSED IN MG ELEMENTAL CALCIUM) 500 MG chewable tablet Chew 1 tablet by mouth at bedtime.    carboxymethylcellulose 1 % ophthalmic solution Place 1 drop into both eyes 5 (five) times daily.    dorzolamide-timolol (COSOPT) 22.3-6.8 MG/ML ophthalmic solution Place 1 drop into both eyes 2 (two) times daily.    ferrous sulfate 325 (65 FE) MG tablet Take 325 mg by mouth 3 (three) times daily with meals.    finasteride (PROSCAR) 5 MG tablet Take 5 mg by mouth daily with breakfast.     lamoTRIgine (LAMICTAL) 200 MG tablet Take 200 mg by mouth 2 (two) times daily.    latanoprost (XALATAN) 0.005 % ophthalmic solution Place 1 drop into both eyes at bedtime.    omeprazole (PRILOSEC) 20 MG capsule Take 20 mg by mouth daily with breakfast.    senna-docusate (SENOKOT-S) 8.6-50 MG tablet Take 2 tablets by mouth daily with breakfast.      STOP taking these medications     apixaban (ELIQUIS) 5 MG TABS tablet      levETIRAcetam (KEPPRA) 500 MG tablet      Multiple Vitamin (MULTIVITAMIN WITH MINERALS) TABS      pravastatin (PRAVACHOL) 40 MG tablet      traMADol (ULTRAM) 50 MG tablet        Allergies  Allergen Reactions  . Codeine Rash      The results of significant diagnostics from this hospitalization (including imaging, microbiology, ancillary and laboratory) are listed below for reference.    Significant Diagnostic Studies: Dg Chest 2 View  Result Date: 11/16/2016 CLINICAL DATA:  Altered mental status, increase falls EXAM: CHEST  2 VIEW COMPARISON:  Portable chest x-ray of 10/31/2015 FINDINGS: The lungs remain hyperaerated. There is a vague haziness in the left upper lobe and developing pneumonia cannot be excluded. Followup chest x-ray is recommended. There is some rotation of the patient turned slightly to the right which may accentuate the superior mediastinum on the left, but a left superior mediastinal soft tissue  lesion cannot be excluded with some indentation upon the upper thoracic tracheal air shadow. CT of the chest may be helpful to assess further. This appears more prominent than noted on the prior chest x-ray. Cardiomegaly is stable as is a moderate size hiatal hernia. No acute bony abnormality is seen. 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 IMPRESSION: 1. Vague haziness in the left upper lobe. Cannot exclude developing pneumonia. Consider followup chest x-ray. 2. More  soft tissue fullness to the left peritracheal region at the level of the left clavicular head may be due to rotation but a soft tissue lesion cannot be excluded. Consider CT of the chest with IV contrast. 3. Stable cardiomegaly and moderate size hiatal hernia. Electronically Signed   By: Dwyane Dee M.D.   On: 11/16/2016 14:36   Ct Chest W Contrast  Result Date: 11/16/2016 CLINICAL DATA:  Abnormal chest x-ray.  Rule out pneumonia. EXAM: CT CHEST WITH CONTRAST TECHNIQUE: Multidetector CT imaging of the chest was performed during intravenous contrast administration. CONTRAST:  100 ISOVUE-300 IOPAMIDOL (ISOVUE-300) INJECTION 61% COMPARISON:  11/16/2016.  Chest CT 11/16/2006 FINDINGS: Cardiovascular: Cardiomegaly. Aorta is normal caliber. Mildly prominent main pulmonary artery, 3.5 cm. Prior CABG. Mediastinum/Nodes: Borderline sized mediastinal lymph nodes. AP window lymph node has a short axis diameter of 13 mm. Right paratracheal lymph node has a short axis diameter of 10 mm. Right paratracheal node more inferiorly has a short axis diameter of 14 mm. No hilar or axillary adenopathy. Large hiatal hernia. Lungs/Pleura: There is vascular congestion. Trace left pleural effusion. No confluent opacities to suggest pneumonia. In particular, no evidence for pneumonia in the left upper lobe. No suspicious pulmonary nodules. Upper Abdomen: Imaging into the upper abdomen shows no acute findings. Mild diffuse fatty infiltration of the liver.  Musculoskeletal: Chest wall soft tissues are unremarkable. No acute bony abnormality. IMPRESSION: Cardiomegaly with vascular congestion. Mildly enlarged pulmonary artery suggests pulmonary arterial hypertension. Prior CABG. Trace left pleural effusion. No evidence of pneumonia. Borderline sized mediastinal lymph nodes, likely reactive. Fatty infiltration of the liver. Large hiatal hernia. Electronically Signed   By: Charlett Nose M.D.   On: 11/16/2016 16:30    Microbiology: Recent Results (from the past 240 hour(s))  Blood culture (routine x 2)     Status: None (Preliminary result)   Collection Time: 11/16/16  3:56 PM  Result Value Ref Range Status   Specimen Description BLOOD BLOOD LEFT FOREARM  Final   Special Requests BOTTLES DRAWN AEROBIC AND ANAEROBIC 5 CC EA  Final   Culture   Final    NO GROWTH < 24 HOURS Performed at [facility], [address], [place], [place] [address]    Report Status PENDING  Incomplete  Blood culture (routine x 2)     Status: None (Preliminary result)   Collection Time: 11/16/16  4:24 PM  Result Value Ref Range Status   Specimen Description BLOOD RIGHT ANTECUBITAL  Final   Special Requests BOTTLES DRAWN AEROBIC AND ANAEROBIC 5CC  Final   Culture   Final    NO GROWTH < 24 HOURS Performed at [facility], [address] [place], [place] [address]    Report Status PENDING  Incomplete  Culture, blood (routine x 2) Call MD if unable to obtain prior to antibiotics being given     Status: None (Preliminary result)   Collection Time: 11/16/16  8:20 PM  Result Value Ref Range Status   Specimen Description BLOOD RIGHT ARM  Final   Special Requests BOTTLES DRAWN AEROBIC ONLY 5CC  Final   Culture   Final    NO GROWTH < 24 HOURS Performed at [facility], [address] [place], [place] [address]    Report Status PENDING  Incomplete  Culture, blood (routine x 2) Call MD if unable to obtain prior to antibiotics being given     Status: None  (Preliminary result)   Collection Time: 11/16/16  8:20  PM  Result Value Ref Range Status   Specimen Description BLOOD RIGHT HAND  Final   Special Requests IN PEDIATRIC BOTTLE 5CC  Final   Culture   Final    NO GROWTH < 24 HOURS Performed at [facility], [address], [place], [place] [address]    Report Status PENDING  Incomplete  MRSA PCR Screening     Status: None   Collection Time: 11/17/16 12:40 AM  Result Value Ref Range Status   MRSA by PCR NEGATIVE NEGATIVE Final    Comment:        The GeneXpert MRSA Assay (FDA approved for NASAL specimens only), is one component of a comprehensive MRSA colonization surveillance program. It is not intended to diagnose MRSA infection nor to guide or monitor treatment for MRSA infections.      Labs: Basic Metabolic Panel:  Recent Labs Lab 11/16/16 1216 11/16/16 2334 11/17/16 0556 11/18/16 0533  NA 141  --  142  --   K 3.0*  --  2.9*  --   CL 106  --  109  --   CO2 25  --  25  --   GLUCOSE 87  --  94  --   BUN 12  --  10  --   CREATININE 1.18 1.00 0.99 1.10  CALCIUM 8.9  --  8.6*  --    Liver Function Tests:  Recent Labs Lab 11/16/16 1216 11/17/16 0556  AST 25 25  ALT 12* 10*  ALKPHOS 87 77  BILITOT 1.6* 1.6*  PROT 8.2* 7.2  ALBUMIN 3.9 3.4*   No results for input(s): LIPASE, AMYLASE in the last 168 hours. No results for input(s): AMMONIA in the last 168 hours. CBC:  Recent Labs Lab 11/16/16 1216 11/16/16 2334  WBC 6.1 6.7  NEUTROABS  --  4.9  HGB 8.6* 9.5*  HCT 27.1* 30.7*  MCV 82.9 82.1  PLT 266 266   Cardiac Enzymes: No results for input(s): CKTOTAL, CKMB, CKMBINDEX, TROPONINI in the last 168 hours. BNP: BNP (last 3 results) No results for input(s): BNP in the last 8760 hours.  ProBNP (last 3 results) No results for input(s): PROBNP in the last 8760 hours.  CBG: No results for input(s): GLUCAP in the last 168 hours.     SignedRhetta Mura MD   [facility] 11/18/2016, 8:36 AM

## 2016-11-18 NOTE — Progress Notes (Signed)
Date: November 18, 2016 Discharge orders checked for needs. [facility] made aware of referral for home RN,PT,OT and AIDE Marcelle SmilingRhonda Christepher Melchior, RN, BSN, [place]   [phone]

## 2016-11-19 NOTE — [place] FL2 (Signed)
[place] MEDICAID FL2 LEVEL OF CARE SCREENING TOOL     IDENTIFICATION  Patient Name: [name] Birthdate: 11-Apr-1946 Sex: male Admission Date (Current Location): 11/16/2016  [facility] [place]   Facility and Address:  [facility],  [address], [place] [id number]      Provider Number: [id number]  Attending Physician Name and Address:  Rhetta MuraJai-Gurmukh Samtani, MD  Relative Name and Phone Number:       Current Level of Care: Hospital Recommended Level of Care: Assisted Living Facility Prior Approval Number:    Date Approved/Denied:   PASRR Number:    Discharge Plan: Domiciliary (Rest home) ([facility])    Current Diagnoses: Patient Active Problem List   Diagnosis Date Noted  . Toxic metabolic encephalopathy 11/16/2016  . Weight loss, non-intentional 03/03/2016  . Acute upper respiratory infection 03/03/2016  . Essential hypertension 03/03/2016  . Hyperlipidemia 03/03/2016  . Abnormal echocardiogram 03/03/2016  . Ventral hernia without obstruction or gangrene 01/26/2016  . Hypertensive heart disease 12/31/2015  . Atrial fibrillation and flutter (HCC) 12/31/2015  . Left ventricular dysfunction 12/31/2015  . CAD (coronary artery disease) 12/31/2015  . Anemia, iron deficiency 12/31/2015  . GERD without esophagitis 12/31/2015  . Dyslipidemia 12/31/2015  . H/O: glaucoma 11/04/2015  . H/O coronary artery bypass surgery 11/04/2015  . Major depressive disorder 11/03/2015  . Dementia with behavioral disturbance 11/01/2015  . Depression, major, single episode, severe (HCC) 11/01/2015  . Seizure disorder (HCC) 08/09/2012  . BPH (benign prostatic hyperplasia)     Orientation RESPIRATION BLADDER Height & Weight     Self, Situation, Place (Orientation "in and out")  Normal Incontinent Weight: 152 lb 1.9 oz (69 kg) Height:  5\' 10"  (177.8 cm)  BEHAVIORAL SYMPTOMS/MOOD NEUROLOGICAL BOWEL NUTRITION STATUS  Other  (Comment) (Impulsive, poor attention span)   Incontinent Diet (Heart Healthy)  AMBULATORY STATUS COMMUNICATION OF NEEDS Skin   Extensive Assist Verbally Other (Comment) (Abrasions to Head, arm, R heel, Ecchymosis)                       Personal Care Assistance Level of Assistance  Bathing, Dressing Bathing Assistance: Limited assistance   Dressing Assistance: Limited assistance     Functional Limitations Info             SPECIAL CARE FACTORS FREQUENCY  PT (By licensed PT), OT (By licensed OT)     PT Frequency: Eval and treat OT Frequency: Eval and treat            Contractures Contractures Info: Not present    Additional Factors Info  Code Status, Allergies (DNR) Code Status Info: DNR Allergies Info: Codeine           Current Medications (11/19/2016):  This is the current hospital active medication list Current Facility-Administered Medications  Medication Dose Route Frequency Provider Last Rate Last Dose  . 0.9 % NaCl with KCl 40 mEq / L  infusion   Intravenous Continuous Rhetta MuraJai-Gurmukh Samtani, MD 75 mL/hr at 11/19/16 0145 75 mL/hr at 11/19/16 0145  . apixaban (ELIQUIS) tablet 5 mg  5 mg Oral BID Rhetta MuraJai-Gurmukh Samtani, MD   5 mg at 11/19/16 1011  . aspirin chewable tablet 81 mg  81 mg Oral Q breakfast Rhetta MuraJai-Gurmukh Samtani, MD   81 mg at 11/19/16 0840  . dorzolamide-timolol (COSOPT) 22.3-6.8 MG/ML ophthalmic solution 1 drop  1 drop Both Eyes BID Rhetta MuraJai-Gurmukh Samtani, MD   1 drop at 11/19/16  1011  . escitalopram (LEXAPRO) tablet 5 mg  5 mg Oral Q breakfast Rhetta Mura, MD   5 mg at 11/19/16 0840  . ferrous sulfate tablet 325 mg  325 mg Oral TID WC Rhetta Mura, MD   325 mg at 11/19/16 0840  . finasteride (PROSCAR) tablet 5 mg  5 mg Oral Q breakfast Rhetta Mura, MD   5 mg at 11/19/16 0840  . lamoTRIgine (LAMICTAL) tablet 200 mg  200 mg Oral BID Rhetta Mura, MD   200 mg at 11/19/16 1011  . latanoprost (XALATAN) 0.005 % ophthalmic solution 1  drop  1 drop Both Eyes QHS Rhetta Mura, MD   1 drop at 11/17/16 2302  . levETIRAcetam (KEPPRA) tablet 500 mg  500 mg Oral BID Rhetta Mura, MD   500 mg at 11/19/16 1011  . multivitamin with minerals tablet 1 tablet  1 tablet Oral Q breakfast Rhetta Mura, MD   1 tablet at 11/19/16 0840  . pantoprazole (PROTONIX) EC tablet 40 mg  40 mg Oral Daily Rhetta Mura, MD   40 mg at 11/19/16 1011  . polyvinyl alcohol (LIQUIFILM TEARS) 1.4 % ophthalmic solution 1 drop  1 drop Both Eyes 5 X Daily Rhetta Mura, MD   1 drop at 11/19/16 1011  . senna-docusate (Senokot-S) tablet 2 tablet  2 tablet Oral Q breakfast Rhetta Mura, MD   2 tablet at 11/19/16 1610     Discharge Medications: Please see discharge summary for a list of discharge medications. Current Discharge Medication List        CONTINUE these medications which have CHANGED   Details  escitalopram (LEXAPRO) 5 MG tablet Take 1 tablet (5 mg total) by mouth daily with breakfast. Qty: 30 tablet, Refills: 0          CONTINUE these medications which have NOT CHANGED   Details  albuterol (PROVENTIL HFA;VENTOLIN HFA) 108 (90 Base) MCG/ACT inhaler Inhale 2 puffs into the lungs every 6 (six) hours as needed for wheezing or shortness of breath.    aspirin 81 MG tablet Take 81 mg by mouth daily with breakfast.    brimonidine (ALPHAGAN) 0.2 % ophthalmic solution Place 1 drop into both eyes 2 (two) times daily. Reported on 01/05/2016    calcium carbonate (TUMS - DOSED IN MG ELEMENTAL CALCIUM) 500 MG chewable tablet Chew 1 tablet by mouth at bedtime.    carboxymethylcellulose 1 % ophthalmic solution Place 1 drop into both eyes 5 (five) times daily.    dorzolamide-timolol (COSOPT) 22.3-6.8 MG/ML ophthalmic solution Place 1 drop into both eyes 2 (two) times daily.    ferrous sulfate 325 (65 FE) MG tablet Take 325 mg by mouth 3 (three) times daily with meals.    finasteride (PROSCAR) 5 MG tablet Take  5 mg by mouth daily with breakfast.     lamoTRIgine (LAMICTAL) 200 MG tablet Take 200 mg by mouth 2 (two) times daily.    latanoprost (XALATAN) 0.005 % ophthalmic solution Place 1 drop into both eyes at bedtime.    omeprazole (PRILOSEC) 20 MG capsule Take 20 mg by mouth daily with breakfast.    senna-docusate (SENOKOT-S) 8.6-50 MG tablet Take 2 tablets by mouth daily with breakfast.         STOP taking these medications     apixaban (ELIQUIS) 5 MG TABS tablet      levETIRAcetam (KEPPRA) 500 MG tablet      Multiple Vitamin (MULTIVITAMIN WITH MINERALS) TABS      pravastatin (PRAVACHOL) 40 MG  tablet      traMADol (ULTRAM) 50 MG tablet            Allergies  Allergen Reactions  . Codeine Rash     Relevant Imaging Results:  Relevant Lab Results:   Additional Information SSN:  [id number]     Home Health needed for RN, PT and OT   Facility to arrange.  Plans eventual return home per chart  Darylene Price, LCSW

## 2016-11-19 NOTE — Discharge Summary (Signed)
Physician Discharge Summary  [name] [id number] DOB: 03-Jan-1946 DOA: 11/16/2016  [facility]  Admit date: 11/16/2016 Discharge date: 11/19/2016  Time spent: 40 minutes  Recommendations for Outpatient Follow-up:  1. See MAR changes-cut back lexapro, d/c tramadol on d/c 2. Needs OP supervision and labs for hemoglobin 3. [facility] as NOK wishes he have this instead of SNF placement 4. Needs dementia screening and GOC as OP  Discharge Diagnoses:  Active Problems:   Toxic metabolic encephalopathy   Discharge Condition:  improved  Diet recommendation: hh  Filed Weights   11/16/16 1900 11/17/16 1545  Weight: 69 kg (152 lb 1.9 oz) 69 kg (152 lb 1.9 oz)    History of present illness:  [age] ? CAD + CABG [place] [facility] Known Grd 2 DD Admitted for Syncopy 2014-found to have LBBB abd brady with 3 sec pauses Sz disorder BPH Htn Prior TIA Known SI after severe depression Multiple falls coming to Ed x 2 in dec 2017  Admitted with metabolic encephalopathy and anemia 3.15.18 and concerns for PNA on cxr which were not found on CT chest   Hospital Course:  I           Toxic metabolic encephalopathy-unclear etiology-patient is on multiple medications which can cause confusion.  Pneumonia not seen on CT of the chest -d/c cefepime and vancomycin-no acute fever nor chills II          Polypharmacy-holding Ultram 50, Pravachol 40, cutting back Lexapro from 10-5 mg every morning and we will monitor history of CAD status post CABG and [place] [facility] unclear time frame-continue aspirin. III         Anemia unclear etiology-iron studies have been ordered in the emergency room we will follow. Hemoccult was negative. He is on 3 times a day and sulfate which is probably intolerable and will cause dark stool. He may benefit from IV iron in the future and should have labs in 1 week at pcp offcie IV         History of seizures-is on Lamictal 200 3 times a  day.He has been on Dilantin in the past. V          Questionable COPD/asthma is on albuterol inhalers which can be continued if he needs it we will hold for now VI         Osteoporosis hold aspart carbonate 500 daily at bedtime for now-resumed on d/c VII        BPH continue finasteride 5 mg every morning VIII       Mild hypokalemia give IV fluid with 75 cc of saline -Added runs KCL IV 30 bid 3/15--resolved on d/c IX         ? Afib, [place] score >3-was on Apixaban--risks and benefits of significant bleeding discussed with family-consensus-would benefit from mainly asa and AC too high risk in terms of fall ris X          Frequent falls -for his debility PT did evaluate--familly taking home-max support at home requested    Discharge Exam: Vitals:   11/18/16 2321 11/19/16 0554  BP: (!) 150/71 140/72  Pulse: 82 63  Resp: 18 18  Temp: 97.7 F (36.5 C) 97.3 F (36.3 C)    General: alert more oriented in nad Cardiovascular:  s1s 2 no m/r/g Respiratory: clear no added sound  Discharge Instructions   Discharge Instructions    Diet - low sodium heart healthy  Complete by:  As directed    Discharge instructions    Complete by:  As directed    See medicine changes We will order home health No more apixaban-only aspirin   Increase activity slowly    Complete by:  As directed      Current Discharge Medication List    CONTINUE these medications which have CHANGED   Details  escitalopram (LEXAPRO) 5 MG tablet Take 1 tablet (5 mg total) by mouth daily with breakfast. Qty: 30 tablet, Refills: 0      CONTINUE these medications which have NOT CHANGED   Details  albuterol (PROVENTIL HFA;VENTOLIN HFA) 108 (90 Base) MCG/ACT inhaler Inhale 2 puffs into the lungs every 6 (six) hours as needed for wheezing or shortness of breath.    aspirin 81 MG tablet Take 81 mg by mouth daily with breakfast.    brimonidine (ALPHAGAN) 0.2 % ophthalmic solution Place 1 drop into both eyes 2 (two) times  daily. Reported on 01/05/2016    calcium carbonate (TUMS - DOSED IN MG ELEMENTAL CALCIUM) 500 MG chewable tablet Chew 1 tablet by mouth at bedtime.    carboxymethylcellulose 1 % ophthalmic solution Place 1 drop into both eyes 5 (five) times daily.    dorzolamide-timolol (COSOPT) 22.3-6.8 MG/ML ophthalmic solution Place 1 drop into both eyes 2 (two) times daily.    ferrous sulfate 325 (65 FE) MG tablet Take 325 mg by mouth 3 (three) times daily with meals.    finasteride (PROSCAR) 5 MG tablet Take 5 mg by mouth daily with breakfast.     lamoTRIgine (LAMICTAL) 200 MG tablet Take 200 mg by mouth 2 (two) times daily.    latanoprost (XALATAN) 0.005 % ophthalmic solution Place 1 drop into both eyes at bedtime.    omeprazole (PRILOSEC) 20 MG capsule Take 20 mg by mouth daily with breakfast.    senna-docusate (SENOKOT-S) 8.6-50 MG tablet Take 2 tablets by mouth daily with breakfast.      STOP taking these medications     apixaban (ELIQUIS) 5 MG TABS tablet      levETIRAcetam (KEPPRA) 500 MG tablet      Multiple Vitamin (MULTIVITAMIN WITH MINERALS) TABS      pravastatin (PRAVACHOL) 40 MG tablet      traMADol (ULTRAM) 50 MG tablet        Allergies  Allergen Reactions  . Codeine Rash      The results of significant diagnostics from this hospitalization (including imaging, microbiology, ancillary and laboratory) are listed below for reference.    Significant Diagnostic Studies: Dg Chest 2 View  Result Date: 11/16/2016 CLINICAL DATA:  Altered mental status, increase falls EXAM: CHEST  2 VIEW COMPARISON:  Portable chest x-ray of 10/31/2015 FINDINGS: The lungs remain hyperaerated. There is a vague haziness in the left upper lobe and developing pneumonia cannot be excluded. Followup chest x-ray is recommended. There is some rotation of the patient turned slightly to the right which may accentuate the superior mediastinum on the left, but a left superior mediastinal soft tissue lesion  cannot be excluded with some indentation upon the upper thoracic tracheal air shadow. CT of the chest may be helpful to assess further. This appears more prominent than noted on the prior chest x-ray. Cardiomegaly is stable as is a moderate size hiatal hernia. No acute bony abnormality is seen. 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 IMPRESSION: 1. Vague haziness in the left upper lobe. Cannot exclude developing pneumonia. Consider followup chest x-ray. 2. More  soft tissue fullness to the left peritracheal region at the level of the left clavicular head may be due to rotation but a soft tissue lesion cannot be excluded. Consider CT of the chest with IV contrast. 3. Stable cardiomegaly and moderate size hiatal hernia. Electronically Signed   By: Dwyane Dee M.D.   On: 11/16/2016 14:36   Ct Chest W Contrast  Result Date: 11/16/2016 CLINICAL DATA:  Abnormal chest x-ray.  Rule out pneumonia. EXAM: CT CHEST WITH CONTRAST TECHNIQUE: Multidetector CT imaging of the chest was performed during intravenous contrast administration. CONTRAST:  100 ISOVUE-300 IOPAMIDOL (ISOVUE-300) INJECTION 61% COMPARISON:  11/16/2016.  Chest CT 11/16/2006 FINDINGS: Cardiovascular: Cardiomegaly. Aorta is normal caliber. Mildly prominent main pulmonary artery, 3.5 cm. Prior CABG. Mediastinum/Nodes: Borderline sized mediastinal lymph nodes. AP window lymph node has a short axis diameter of 13 mm. Right paratracheal lymph node has a short axis diameter of 10 mm. Right paratracheal node more inferiorly has a short axis diameter of 14 mm. No hilar or axillary adenopathy. Large hiatal hernia. Lungs/Pleura: There is vascular congestion. Trace left pleural effusion. No confluent opacities to suggest pneumonia. In particular, no evidence for pneumonia in the left upper lobe. No suspicious pulmonary nodules. Upper Abdomen: Imaging into the upper abdomen shows no acute findings. Mild diffuse fatty infiltration of the liver.  Musculoskeletal: Chest wall soft tissues are unremarkable. No acute bony abnormality. IMPRESSION: Cardiomegaly with vascular congestion. Mildly enlarged pulmonary artery suggests pulmonary arterial hypertension. Prior CABG. Trace left pleural effusion. No evidence of pneumonia. Borderline sized mediastinal lymph nodes, likely reactive. Fatty infiltration of the liver. Large hiatal hernia. Electronically Signed   By: Charlett Nose M.D.   On: 11/16/2016 16:30    Microbiology: Recent Results (from the past 240 hour(s))  Blood culture (routine x 2)     Status: None (Preliminary result)   Collection Time: 11/16/16  3:56 PM  Result Value Ref Range Status   Specimen Description BLOOD BLOOD LEFT FOREARM  Final   Special Requests BOTTLES DRAWN AEROBIC AND ANAEROBIC 5 CC EA  Final   Culture   Final    NO GROWTH 2 DAYS Performed at [facility], [address], [place], [place] [address]    Report Status PENDING  Incomplete  Blood culture (routine x 2)     Status: None (Preliminary result)   Collection Time: 11/16/16  4:24 PM  Result Value Ref Range Status   Specimen Description BLOOD RIGHT ANTECUBITAL  Final   Special Requests BOTTLES DRAWN AEROBIC AND ANAEROBIC 5CC  Final   Culture   Final    NO GROWTH 2 DAYS Performed at [facility], [address], [place], [place] [address]    Report Status PENDING  Incomplete  Culture, blood (routine x 2) Call MD if unable to obtain prior to antibiotics being given     Status: None (Preliminary result)   Collection Time: 11/16/16  8:20 PM  Result Value Ref Range Status   Specimen Description BLOOD RIGHT ARM  Final   Special Requests BOTTLES DRAWN AEROBIC ONLY 5CC  Final   Culture   Final    NO GROWTH 2 DAYS Performed at [facility], [address] [place], [place] [address]    Report Status PENDING  Incomplete  Culture, blood (routine x 2) Call MD if unable to obtain prior to antibiotics being given     Status: None (Preliminary  result)   Collection Time: 11/16/16  8:20 PM  Result  Value Ref Range Status   Specimen Description BLOOD RIGHT HAND  Final   Special Requests IN PEDIATRIC BOTTLE 5CC  Final   Culture   Final    NO GROWTH 2 DAYS Performed at [facility], [address], [place], [place] [address]    Report Status PENDING  Incomplete  MRSA PCR Screening     Status: None   Collection Time: 11/17/16 12:40 AM  Result Value Ref Range Status   MRSA by PCR NEGATIVE NEGATIVE Final    Comment:        The GeneXpert MRSA Assay (FDA approved for NASAL specimens only), is one component of a comprehensive MRSA colonization surveillance program. It is not intended to diagnose MRSA infection nor to guide or monitor treatment for MRSA infections.      Labs: Basic Metabolic Panel:  Recent Labs Lab 11/16/16 1216 11/16/16 2334 11/17/16 0556 11/18/16 0533  NA 141  --  142 143  K 3.0*  --  2.9* 3.8  CL 106  --  109 112*  CO2 25  --  25 22  GLUCOSE 87  --  94 88  BUN 12  --  10 9  CREATININE 1.18 1.00 0.99 1.13  1.10  CALCIUM 8.9  --  8.6* 8.7*   Liver Function Tests:  Recent Labs Lab 11/16/16 1216 11/17/16 0556  AST 25 25  ALT 12* 10*  ALKPHOS 87 77  BILITOT 1.6* 1.6*  PROT 8.2* 7.2  ALBUMIN 3.9 3.4*   No results for input(s): LIPASE, AMYLASE in the last 168 hours. No results for input(s): AMMONIA in the last 168 hours. CBC:  Recent Labs Lab 11/16/16 1216 11/16/16 2334  WBC 6.1 6.7  NEUTROABS  --  4.9  HGB 8.6* 9.5*  HCT 27.1* 30.7*  MCV 82.9 82.1  PLT 266 266   Cardiac Enzymes: No results for input(s): CKTOTAL, CKMB, CKMBINDEX, TROPONINI in the last 168 hours. BNP: BNP (last 3 results) No results for input(s): BNP in the last 8760 hours.  ProBNP (last 3 results) No results for input(s): PROBNP in the last 8760 hours.  CBG: No results for input(s): GLUCAP in the last 168 hours.     SignedRhetta Mura MD   [facility] 11/19/2016, 10:29  AM

## 2016-11-19 NOTE — Progress Notes (Signed)
Report called to [place]as[place]eda at [facility] [phone]

## 2016-11-19 NOTE — Clinical Social Work Note (Signed)
CSW notified by nursing of patient's stability for d/c today.  DC plan had been indicated for home- however- [facility] with plan to return to facility.  Family had been unable to be reached yesterday;  SW spoke with patient's sister-in-law Diane who now looks after patient's care as her husband (pt's brother) has passed away.  Diane:  [phone]. Sister-in-law confirmed her wishes for patient to return to [facility]; spoke to [place]ashida at [facility] and DC summary and Fl2 sent to facility for review.  Ok per MD for d/c today.  Family requests d/c via EMS.  Discussed with nursing; will call report to facility.  Patient is aware of d/c and is agreeable. He is alert and oriented to person and place at this time.  Home Health will be arranged through the facility per Rashida; [facility] and sent with pt's packet.  Ok per Rashida for patient to be return to facility today.  No further SW intervention indicated at this time. Lorri Frederickonna T. Jaci LazierCrowder, [place] [phone]

## 2016-11-20 ENCOUNTER — Encounter (HOSPITAL_COMMUNITY): Payer: Self-pay

## 2016-11-20 ENCOUNTER — Emergency Department (HOSPITAL_COMMUNITY): Payer: Medicare Other

## 2016-11-20 ENCOUNTER — Inpatient Hospital Stay (HOSPITAL_COMMUNITY)
Admission: EM | Admit: 2016-11-20 | Discharge: 2016-11-28 | DRG: 092 | Disposition: A | Discharge status: Skilled Nursing Facility | Payer: Medicare Other | Attending: Internal Medicine | Admitting: Internal Medicine

## 2016-11-20 ENCOUNTER — Emergency Department (HOSPITAL_COMMUNITY)
Admission: EM | Admit: 2016-11-20 | Discharge: 2016-11-20 | Disposition: A | Discharge status: Home / Self Care | Payer: Medicare Other | Source: Home / Self Care | Attending: Emergency Medicine | Admitting: Emergency Medicine

## 2016-11-20 DIAGNOSIS — G92 Toxic encephalopathy: Principal | ICD-10-CM | POA: Diagnosis present

## 2016-11-20 DIAGNOSIS — Z8673 Personal history of transient ischemic attack (TIA), and cerebral infarction without residual deficits: Secondary | ICD-10-CM

## 2016-11-20 DIAGNOSIS — I4892 Unspecified atrial flutter: Secondary | ICD-10-CM | POA: Diagnosis present

## 2016-11-20 DIAGNOSIS — I2510 Atherosclerotic heart disease of native coronary artery without angina pectoris: Secondary | ICD-10-CM | POA: Insufficient documentation

## 2016-11-20 DIAGNOSIS — Y939 Activity, unspecified: Secondary | ICD-10-CM

## 2016-11-20 DIAGNOSIS — M542 Cervicalgia: Secondary | ICD-10-CM

## 2016-11-20 DIAGNOSIS — F0391 Unspecified dementia with behavioral disturbance: Secondary | ICD-10-CM | POA: Diagnosis present

## 2016-11-20 DIAGNOSIS — R600 Localized edema: Secondary | ICD-10-CM | POA: Diagnosis present

## 2016-11-20 DIAGNOSIS — R296 Repeated falls: Secondary | ICD-10-CM | POA: Diagnosis present

## 2016-11-20 DIAGNOSIS — E785 Hyperlipidemia, unspecified: Secondary | ICD-10-CM | POA: Diagnosis present

## 2016-11-20 DIAGNOSIS — N400 Enlarged prostate without lower urinary tract symptoms: Secondary | ICD-10-CM | POA: Diagnosis present

## 2016-11-20 DIAGNOSIS — F0390 Unspecified dementia without behavioral disturbance: Secondary | ICD-10-CM

## 2016-11-20 DIAGNOSIS — F03918 Unspecified dementia, unspecified severity, with other behavioral disturbance: Secondary | ICD-10-CM | POA: Diagnosis present

## 2016-11-20 DIAGNOSIS — Y999 Unspecified external cause status: Secondary | ICD-10-CM

## 2016-11-20 DIAGNOSIS — I10 Essential (primary) hypertension: Secondary | ICD-10-CM | POA: Insufficient documentation

## 2016-11-20 DIAGNOSIS — I480 Paroxysmal atrial fibrillation: Secondary | ICD-10-CM | POA: Diagnosis present

## 2016-11-20 DIAGNOSIS — R338 Other retention of urine: Secondary | ICD-10-CM | POA: Diagnosis present

## 2016-11-20 DIAGNOSIS — Z7982 Long term (current) use of aspirin: Secondary | ICD-10-CM

## 2016-11-20 DIAGNOSIS — Z515 Encounter for palliative care: Secondary | ICD-10-CM | POA: Diagnosis not present

## 2016-11-20 DIAGNOSIS — R410 Disorientation, unspecified: Secondary | ICD-10-CM

## 2016-11-20 DIAGNOSIS — W19XXXA Unspecified fall, initial encounter: Secondary | ICD-10-CM | POA: Insufficient documentation

## 2016-11-20 DIAGNOSIS — N401 Enlarged prostate with lower urinary tract symptoms: Secondary | ICD-10-CM | POA: Diagnosis present

## 2016-11-20 DIAGNOSIS — G9340 Encephalopathy, unspecified: Secondary | ICD-10-CM

## 2016-11-20 DIAGNOSIS — Z66 Do not resuscitate: Secondary | ICD-10-CM | POA: Diagnosis present

## 2016-11-20 DIAGNOSIS — L8990 Pressure ulcer of unspecified site, unspecified stage: Secondary | ICD-10-CM | POA: Diagnosis present

## 2016-11-20 DIAGNOSIS — M545 Low back pain: Secondary | ICD-10-CM

## 2016-11-20 DIAGNOSIS — R001 Bradycardia, unspecified: Secondary | ICD-10-CM | POA: Diagnosis present

## 2016-11-20 DIAGNOSIS — Y92129 Unspecified place in nursing home as the place of occurrence of the external cause: Secondary | ICD-10-CM | POA: Insufficient documentation

## 2016-11-20 DIAGNOSIS — Z885 Allergy status to narcotic agent status: Secondary | ICD-10-CM

## 2016-11-20 DIAGNOSIS — E876 Hypokalemia: Secondary | ICD-10-CM | POA: Diagnosis not present

## 2016-11-20 DIAGNOSIS — M25532 Pain in left wrist: Secondary | ICD-10-CM

## 2016-11-20 DIAGNOSIS — G40909 Epilepsy, unspecified, not intractable, without status epilepticus: Secondary | ICD-10-CM

## 2016-11-20 DIAGNOSIS — I4891 Unspecified atrial fibrillation: Secondary | ICD-10-CM | POA: Diagnosis present

## 2016-11-20 DIAGNOSIS — Z79899 Other long term (current) drug therapy: Secondary | ICD-10-CM

## 2016-11-20 DIAGNOSIS — W19XXXD Unspecified fall, subsequent encounter: Secondary | ICD-10-CM

## 2016-11-20 DIAGNOSIS — D638 Anemia in other chronic diseases classified elsewhere: Secondary | ICD-10-CM | POA: Diagnosis present

## 2016-11-20 DIAGNOSIS — Z781 Physical restraint status: Secondary | ICD-10-CM

## 2016-11-20 LAB — CBC WITH DIFFERENTIAL/PLATELET
BASOS ABS: 0 10*3/uL (ref 0.0–0.1)
Basophils Relative: 0 %
EOS PCT: 6 %
Eosinophils Absolute: 0.3 10*3/uL (ref 0.0–0.7)
HCT: 30.1 % — ABNORMAL LOW (ref 39.0–52.0)
Hemoglobin: 9.3 g/dL — ABNORMAL LOW (ref 13.0–17.0)
Lymphocytes Relative: 11 %
Lymphs Abs: 0.6 10*3/uL — ABNORMAL LOW (ref 0.7–4.0)
MCH: 25.8 pg — AB (ref 26.0–34.0)
MCHC: 30.9 g/dL (ref 30.0–36.0)
MCV: 83.6 fL (ref 78.0–100.0)
MONO ABS: 0.6 10*3/uL (ref 0.1–1.0)
Monocytes Relative: 10 %
Neutro Abs: 4.3 10*3/uL (ref 1.7–7.7)
Neutrophils Relative %: 73 %
Platelets: 242 10*3/uL (ref 150–400)
RBC: 3.6 MIL/uL — AB (ref 4.22–5.81)
RDW: 17.7 % — AB (ref 11.5–15.5)
WBC: 5.9 10*3/uL (ref 4.0–10.5)

## 2016-11-20 LAB — BASIC METABOLIC PANEL
ANION GAP: 13 (ref 5–15)
ANION GAP: 14 (ref 5–15)
BUN: 10 mg/dL (ref 6–20)
BUN: 11 mg/dL (ref 6–20)
CALCIUM: 8.8 mg/dL — AB (ref 8.9–10.3)
CALCIUM: 8.9 mg/dL (ref 8.9–10.3)
CO2: 18 mmol/L — ABNORMAL LOW (ref 22–32)
CO2: 20 mmol/L — ABNORMAL LOW (ref 22–32)
Chloride: 106 mmol/L (ref 101–111)
Chloride: 108 mmol/L (ref 101–111)
Creatinine, Ser: 1.06 mg/dL (ref 0.61–1.24)
Creatinine, Ser: 1.17 mg/dL (ref 0.61–1.24)
GFR calc Af Amer: >60 mL/min (ref >60)
GFR calc non Af Amer: >60 mL/min (ref >60)
GLUCOSE: 97 mg/dL (ref 65–99)
Glucose, Bld: 95 mg/dL (ref 65–99)
Potassium: 4.1 mmol/L (ref 3.5–5.1)
Potassium: 3.7 mmol/L (ref 3.5–5.1)
SODIUM: 141 mmol/L (ref 135–145)
Sodium: 138 mmol/L (ref 135–145)

## 2016-11-20 LAB — PROTIME-INR
INR: 2.2
PROTHROMBIN TIME: 24.8 s — AB (ref 11.4–15.2)

## 2016-11-20 LAB — CBC
HEMATOCRIT: 32.7 % — AB (ref 39.0–52.0)
Hemoglobin: 10.3 g/dL — ABNORMAL LOW (ref 13.0–17.0)
MCH: 26.5 pg (ref 26.0–34.0)
MCHC: 31.5 g/dL (ref 30.0–36.0)
MCV: 84.1 fL (ref 78.0–100.0)
Platelets: 311 10*3/uL (ref 150–400)
RBC: 3.89 MIL/uL — AB (ref 4.22–5.81)
RDW: 18.5 % — ABNORMAL HIGH (ref 11.5–15.5)
WBC: 7.7 10*3/uL (ref 4.0–10.5)

## 2016-11-20 LAB — HEPATIC FUNCTION PANEL
ALT: 19 U/L (ref 17–63)
AST: 44 U/L — ABNORMAL HIGH (ref 15–41)
Albumin: 3.4 g/dL — ABNORMAL LOW (ref 3.5–5.0)
Alkaline Phosphatase: 76 U/L (ref 38–126)
Bilirubin, Direct: 0.4 mg/dL (ref 0.1–0.5)
Indirect Bilirubin: 1.2 mg/dL — ABNORMAL HIGH (ref 0.3–0.9)
Total Bilirubin: 1.6 mg/dL — ABNORMAL HIGH (ref 0.3–1.2)
Total Protein: 7 g/dL (ref 6.5–8.1)

## 2016-11-20 LAB — URINALYSIS, ROUTINE W REFLEX MICROSCOPIC
BILIRUBIN URINE: NEGATIVE
Bacteria, UA: NONE SEEN
Glucose, UA: NEGATIVE mg/dL
Hgb urine dipstick: NEGATIVE
Ketones, ur: 20 mg/dL — AB
Leukocytes, UA: NEGATIVE
Nitrite: NEGATIVE
PH: 5 (ref 5.0–8.0)
Protein, ur: 100 mg/dL — AB
SPECIFIC GRAVITY, URINE: 1.02 (ref 1.005–1.030)
SQUAMOUS EPITHELIAL / LPF: NONE SEEN

## 2016-11-20 LAB — PHOSPHORUS: Phosphorus: 2.8 mg/dL (ref 2.5–4.6)

## 2016-11-20 LAB — T4, FREE: Free T4: 1.22 ng/dL — ABNORMAL HIGH (ref 0.61–1.12)

## 2016-11-20 LAB — GLUCOSE, CAPILLARY: GLUCOSE-CAPILLARY: 64 mg/dL — AB (ref 65–99)

## 2016-11-20 LAB — CK: Total CK: 435 U/L — ABNORMAL HIGH (ref 49–397)

## 2016-11-20 LAB — AMMONIA: Ammonia: 49 umol/L — ABNORMAL HIGH (ref 9–35)

## 2016-11-20 LAB — BRAIN NATRIURETIC PEPTIDE: B Natriuretic Peptide: 601.1 pg/mL — ABNORMAL HIGH (ref 0.0–100.0)

## 2016-11-20 LAB — TSH: TSH: 4.465 u[IU]/mL (ref 0.350–4.500)

## 2016-11-20 LAB — I-STAT CG4 LACTIC ACID, ED: Lactic Acid, Venous: 2.52 mmol/L (ref 0.5–1.9)

## 2016-11-20 LAB — MAGNESIUM: MAGNESIUM: 1.9 mg/dL (ref 1.7–2.4)

## 2016-11-20 MED ORDER — SODIUM CHLORIDE 0.9% FLUSH
3.0000 mL | Freq: Two times a day (BID) | INTRAVENOUS | Status: DC
  Administered 2016-11-20 – 2016-11-28 (×14): 3 mL via INTRAVENOUS

## 2016-11-20 MED ORDER — BRIMONIDINE TARTRATE 0.2 % OP SOLN
1.0000 [drp] | Freq: Two times a day (BID) | OPHTHALMIC | Status: DC
  Administered 2016-11-20 – 2016-11-28 (×14): 1 [drp] via OPHTHALMIC
  Filled 2016-11-20: qty 5

## 2016-11-20 MED ORDER — HALOPERIDOL LACTATE 5 MG/ML IJ SOLN
5.0000 mg | Freq: Once | INTRAMUSCULAR | Status: AC
Start: 2016-11-20 — End: 2016-11-20
  Administered 2016-11-20: 5 mg via INTRAVENOUS
  Filled 2016-11-20: qty 1

## 2016-11-20 MED ORDER — FINASTERIDE 5 MG PO TABS
5.0000 mg | ORAL_TABLET | Freq: Every day | ORAL | Status: DC
  Administered 2016-11-24 – 2016-11-26 (×3): 5 mg via ORAL
  Filled 2016-11-20 (×7): qty 1

## 2016-11-20 MED ORDER — ASPIRIN EC 81 MG PO TBEC
81.0000 mg | DELAYED_RELEASE_TABLET | Freq: Every day | ORAL | Status: DC
  Administered 2016-11-24 – 2016-11-26 (×3): 81 mg via ORAL
  Filled 2016-11-20 (×7): qty 1

## 2016-11-20 MED ORDER — PRAVASTATIN SODIUM 40 MG PO TABS: 40.0000 mg | ORAL_TABLET | Freq: Every day | ORAL | Status: DC

## 2016-11-20 MED ORDER — ZIPRASIDONE MESYLATE 20 MG IM SOLR
INTRAMUSCULAR | Status: AC
  Administered 2016-11-20: 20 mg
  Filled 2016-11-20: qty 20

## 2016-11-20 MED ORDER — BRINZOLAMIDE 1 % OP SUSP
1.0000 [drp] | Freq: Two times a day (BID) | OPHTHALMIC | Status: DC
  Administered 2016-11-20 – 2016-11-28 (×16): 1 [drp] via OPHTHALMIC
  Filled 2016-11-20: qty 10

## 2016-11-20 MED ORDER — STERILE WATER FOR INJECTION IJ SOLN
INTRAMUSCULAR | Status: AC
  Administered 2016-11-20: 2 mL
  Filled 2016-11-20: qty 10

## 2016-11-20 MED ORDER — POLYVINYL ALCOHOL 1.4 % OP SOLN
1.0000 [drp] | Freq: Every day | OPHTHALMIC | Status: DC
  Administered 2016-11-20 – 2016-11-28 (×34): 1 [drp] via OPHTHALMIC
  Filled 2016-11-20: qty 15

## 2016-11-20 MED ORDER — TIMOLOL MALEATE 0.5 % OP SOLN
1.0000 [drp] | Freq: Two times a day (BID) | OPHTHALMIC | Status: DC
  Administered 2016-11-20 – 2016-11-28 (×16): 1 [drp] via OPHTHALMIC
  Filled 2016-11-20: qty 5

## 2016-11-20 MED ORDER — DORZOLAMIDE HCL-TIMOLOL MAL 2-0.5 % OP SOLN
1.0000 [drp] | Freq: Two times a day (BID) | OPHTHALMIC | Status: DC
  Filled 2016-11-20 (×2): qty 10

## 2016-11-20 MED ORDER — IBUPROFEN 400 MG PO TABS
600.0000 mg | ORAL_TABLET | Freq: Once | ORAL | Status: AC
  Administered 2016-11-20: 600 mg via ORAL
  Filled 2016-11-20: qty 1

## 2016-11-20 MED ORDER — LAMOTRIGINE 25 MG PO TABS
200.0000 mg | ORAL_TABLET | Freq: Two times a day (BID) | ORAL | Status: DC
  Administered 2016-11-20 – 2016-11-22 (×2): 200 mg via ORAL
  Filled 2016-11-20 (×4): qty 8

## 2016-11-20 MED ORDER — FENTANYL CITRATE (PF) 100 MCG/2ML IJ SOLN
50.0000 ug | Freq: Once | INTRAMUSCULAR | Status: DC
  Filled 2016-11-20: qty 2

## 2016-11-20 MED ORDER — LATANOPROST 0.005 % OP SOLN
1.0000 [drp] | Freq: Every day | OPHTHALMIC | Status: DC
  Administered 2016-11-20 – 2016-11-27 (×8): 1 [drp] via OPHTHALMIC
  Filled 2016-11-20: qty 2.5

## 2016-11-20 MED ORDER — DORZOLAMIDE HCL 2 % OP SOLN
1.0000 [drp] | Freq: Two times a day (BID) | OPHTHALMIC | Status: DC
  Filled 2016-11-20: qty 10

## 2016-11-20 MED ORDER — ENOXAPARIN SODIUM 40 MG/0.4ML ~~LOC~~ SOLN
40.0000 mg | SUBCUTANEOUS | Status: DC
  Administered 2016-11-21: 40 mg via SUBCUTANEOUS
  Filled 2016-11-20: qty 0.4

## 2016-11-20 MED ORDER — CARBOXYMETHYLCELLULOSE SODIUM 1 % OP SOLN: 1.0000 [drp] | Freq: Every day | OPHTHALMIC | Status: DC

## 2016-11-20 MED ORDER — SENNOSIDES-DOCUSATE SODIUM 8.6-50 MG PO TABS
2.0000 | ORAL_TABLET | Freq: Every day | ORAL | Status: DC
  Filled 2016-11-20 (×6): qty 2

## 2016-11-20 NOTE — H&P (Addendum)
History and Physical    [name]:[id number] DOB: 1945-09-11 DOA: 11/20/2016  PCP: [facility]  Patient coming from: [facility]   Chief Complaint: falls   HPI: [name] is a 71 y.o. male with medical history significant of A Fib, dementia, multiple falls, seizures; he presents after a fall and also encephalopathy. Patient had recently been discharged from the hospital on 3/14. He was admitted for toxic metabolic encephalopathy of unclear etiology. At that time, he was recommended to be discharged to skilled nursing facility, however, family had declined and took him back to assisted living facility with home health services. Early morning on 3/18, patient was found on the floor after an unwitnessed fall. He was sent to the emergency department where workup was largely unremarkable. He was sent back to assisted living home. He then presented back to the emergency department on the afternoon of 3/18 after patient was found crawling on the floor, being combative. He does have history of multiple falls and dementia, but had not been aggressive or combative in the past. He was given Haldol in the EMS, and Geodon in the emergency department.   History is gather from ED staff, notes, and sister-in-law/guardian over the phone. Patient had apparently been falling more recently with worsening mental status over the past month of so. Today, he became combative and aggressive, which he normally is not. At baseline, he has good days and bad days. On bad days, he is very grumpy, per family. On good days, he is able to carry on conversations and ambulates, and eats well. Patient is lethargic and somnolent in the ED after getting haldol and geodon and does not awaken to participate in examination.   ED Course: Labs obtained, CT head and cervical spine negative for acute abnormalities. Patient given geodon in the ED due to agitation and combativeness. TRH was called to place patient in  observation and further work up.   Review of Systems: Unable to gather as patient somnolent on exam   Past Medical History:  Diagnosis Date  . A-fib (HCC)   . BPH (benign prostatic hyperplasia)   . Coronary artery disease   . Dementia with behavioral disturbance 11/01/2015  . Glaucoma   . H/O hiatal hernia   . Hypertension   . LBBB (left bundle branch block)   . Rib fracture 09/18/2012  . Seizures (HCC)    "h/o gran mal & petit" (08/07/2012)  . Stroke [facility]) 1980's   "mini w/seizure at the same time" (08/07/2012)  . Suicidal ideation   . Syncope and collapse 08/07/2012   "w/loss of consciousness; found down; don't know how long he was out" (08/07/2012)    Past Surgical History:  Procedure Laterality Date  . BRAIN SURGERY    . CORONARY ARTERY BYPASS GRAFT  ~ 2010   CABG X1  . TIBIA FRACTURE SURGERY  1960's   RLE     reports that he has never smoked. He has never used smokeless tobacco. He reports that he does not drink alcohol or use drugs.  Allergies  Allergen Reactions  . Codeine Rash    Family History  Problem Relation Age of Onset  . Heart disease      No family history    Prior to Admission medications   Medication Sig Start Date End Date Taking? Authorizing Provider  albuterol (PROVENTIL HFA;VENTOLIN HFA) 108 (90 Base) MCG/ACT inhaler Inhale 2 puffs into the lungs every 6 (six) hours as needed for wheezing  or shortness of breath.    Historical Provider, MD  aspirin 81 MG tablet Take 81 mg by mouth daily with breakfast.    Historical Provider, MD  brimonidine (ALPHAGAN) 0.2 % ophthalmic solution Place 1 drop into both eyes 2 (two) times daily. Reported on 01/05/2016    Historical Provider, MD  calcium carbonate (TUMS - DOSED IN MG ELEMENTAL CALCIUM) 500 MG chewable tablet Chew 1 tablet by mouth at bedtime.    Historical Provider, MD  carboxymethylcellulose 1 % ophthalmic solution Place 1 drop into both eyes 5 (five) times daily.    Historical Provider, MD    dorzolamide-timolol (COSOPT) 22.3-6.8 MG/ML ophthalmic solution Place 1 drop into both eyes 2 (two) times daily.    Historical Provider, MD  escitalopram (LEXAPRO) 5 MG tablet Take 1 tablet (5 mg total) by mouth daily with breakfast. 11/18/16   Rhetta Mura, MD  ferrous sulfate 325 (65 FE) MG tablet Take 325 mg by mouth 3 (three) times daily with meals.    Historical Provider, MD  finasteride (PROSCAR) 5 MG tablet Take 5 mg by mouth daily with breakfast.     Historical Provider, MD  lamoTRIgine (LAMICTAL) 200 MG tablet Take 200 mg by mouth 2 (two) times daily.    Historical Provider, MD  latanoprost (XALATAN) 0.005 % ophthalmic solution Place 1 drop into both eyes at bedtime.    Historical Provider, MD  Multiple Vitamin (MULTIVITAMIN WITH MINERALS) TABS tablet Take 1 tablet by mouth daily.    Historical Provider, MD  omeprazole (PRILOSEC) 20 MG capsule Take 20 mg by mouth daily with breakfast.    Historical Provider, MD  pravastatin (PRAVACHOL) 40 MG tablet Take 40 mg by mouth daily.    Historical Provider, MD  senna-docusate (SENOKOT-S) 8.6-50 MG tablet Take 2 tablets by mouth daily with breakfast.    Historical Provider, MD    Physical Exam: Vitals:   11/20/16 1645 11/20/16 1700 11/20/16 1715 11/20/16 1730  BP: (!) 90/40 (!) 91/37 (!) 102/44 (!) 121/53  Pulse: (!) 54 (!) 53 (!) 50 (!) 41  Resp: 13 13 12 12   SpO2: 98% 98% 99% 100%    Constitutional: somnolent, does not awaken to voice, but does stir with physical stimuli  Eyes: PERRL, lids and conjunctivae normal, pupils pinpoint but reactive  Neck: normal, supple, no masses, no thyromegaly Respiratory: clear to auscultation bilaterally, no wheezing, no crackles. Normal respiratory effort. No accessory muscle use.  Cardiovascular: Bradycardic rate and irregular rhythm, no murmurs / rubs / gallops. No extremity edema.  Abdomen: no tenderness, no masses palpated. No hepatosplenomegaly. Bowel sounds positive.  Musculoskeletal: no  clubbing / cyanosis. No joint deformity upper and lower extremities. +pitting edema bilaterally  Skin: no rashes, lesions, ulcers. No induration. +bruising throughout upper body  Neurologic: unable to test as patient somnolent on exam  Psychiatric: unable to test as patient somnolent on exam   Labs on Admission: I have personally reviewed following labs and imaging studies  CBC:  Recent Labs Lab 11/16/16 1216 11/16/16 2334 11/20/16 0051 11/20/16 1421  WBC 6.1 6.7 7.7 5.9  NEUTROABS  --  4.9  --  4.3  HGB 8.6* 9.5* 10.3* 9.3*  HCT 27.1* 30.7* 32.7* 30.1*  MCV 82.9 82.1 84.1 83.6  PLT 266 266 311 242   Basic Metabolic Panel:  Recent Labs Lab 11/16/16 1216 11/16/16 2334 11/17/16 0556 11/18/16 0533 11/20/16 0051 11/20/16 1421  NA 141  --  142 143 138 141  K 3.0*  --  2.9*  3.8 4.1 3.7  CL 106  --  109 112* 106 108  CO2 25  --  25 22 18* 20*  GLUCOSE 87  --  94 88 97 95  BUN 12  --  10 9 10 11   CREATININE 1.18 1.00 0.99 1.13  1.10 1.06 1.17  CALCIUM 8.9  --  8.6* 8.7* 8.8* 8.9   GFR: Estimated Creatinine Clearance: 54.9 mL/min (by C-G formula based on SCr of 1.17 mg/dL). Liver Function Tests:  Recent Labs Lab 11/16/16 1216 11/17/16 0556  AST 25 25  ALT 12* 10*  ALKPHOS 87 77  BILITOT 1.6* 1.6*  PROT 8.2* 7.2  ALBUMIN 3.9 3.4*   No results for input(s): LIPASE, AMYLASE in the last 168 hours.  Recent Labs Lab 11/20/16 1421  AMMONIA 49*   Coagulation Profile:  Recent Labs Lab 11/20/16 0051  INR 2.20   Cardiac Enzymes:  Recent Labs Lab 11/20/16 1421  CKTOTAL 435*   BNP (last 3 results) No results for input(s): PROBNP in the last 8760 hours. HbA1C: No results for input(s): HGBA1C in the last 72 hours. CBG: No results for input(s): GLUCAP in the last 168 hours. Lipid Profile: No results for input(s): CHOL, HDL, LDLCALC, TRIG, CHOLHDL, LDLDIRECT in the last 72 hours. Thyroid Function Tests: No results for input(s): TSH, T4TOTAL, FREET4,  T3FREE, THYROIDAB in the last 72 hours. Anemia Panel: No results for input(s): VITAMINB12, FOLATE, FERRITIN, TIBC, IRON, RETICCTPCT in the last 72 hours. Urine analysis:    Component Value Date/Time   COLORURINE AMBER (A) 11/20/2016 1613   APPEARANCEUR CLEAR 11/20/2016 1613   LABSPEC 1.020 11/20/2016 1613   PHURINE 5.0 11/20/2016 1613   GLUCOSEU NEGATIVE 11/20/2016 1613   HGBUR NEGATIVE 11/20/2016 1613   BILIRUBINUR NEGATIVE 11/20/2016 1613   KETONESUR 20 (A) 11/20/2016 1613   PROTEINUR 100 (A) 11/20/2016 1613   UROBILINOGEN 0.2 02/28/2013 1845   NITRITE NEGATIVE 11/20/2016 1613   LEUKOCYTESUR NEGATIVE 11/20/2016 1613   Sepsis Labs: !!!!!!!!!!!!!!!!!!!!!!!!!!!!!!!!!!!!!!!!!!!! @LABRCNTIP (procalcitonin:4,lacticidven:4) ) Recent Results (from the past 240 hour(s))  Blood culture (routine x 2)     Status: None (Preliminary result)   Collection Time: 11/16/16  3:56 PM  Result Value Ref Range Status   Specimen Description BLOOD BLOOD LEFT FOREARM  Final   Special Requests BOTTLES DRAWN AEROBIC AND ANAEROBIC 5 CC EA  Final   Culture   Final    NO GROWTH 4 DAYS Performed at [facility], [address] [place], [place] [address]    Report Status PENDING  Incomplete  Blood culture (routine x 2)     Status: None (Preliminary result)   Collection Time: 11/16/16  4:24 PM  Result Value Ref Range Status   Specimen Description BLOOD RIGHT ANTECUBITAL  Final   Special Requests BOTTLES DRAWN AEROBIC AND ANAEROBIC 5CC  Final   Culture   Final    NO GROWTH 4 DAYS Performed at [facility], [address] [place], [place] [address]    Report Status PENDING  Incomplete  Culture, blood (routine x 2) Call MD if unable to obtain prior to antibiotics being given     Status: None (Preliminary result)   Collection Time: 11/16/16  8:20 PM  Result Value Ref Range Status   Specimen Description BLOOD RIGHT ARM  Final   Special Requests BOTTLES DRAWN AEROBIC ONLY 5CC  Final   Culture    Final    NO GROWTH 4 DAYS Performed at [facility], [address], [place], [place] [address]    Report Status PENDING  Incomplete  Culture, blood (routine x 2) Call MD if unable to obtain prior to antibiotics being given     Status: None (Preliminary result)   Collection Time: 11/16/16  8:20 PM  Result Value Ref Range Status   Specimen Description BLOOD RIGHT HAND  Final   Special Requests IN PEDIATRIC BOTTLE 5CC  Final   Culture   Final    NO GROWTH 4 DAYS Performed at [facility], [address], [place], [place] [address]    Report Status PENDING  Incomplete  MRSA PCR Screening     Status: None   Collection Time: 11/17/16 12:40 AM  Result Value Ref Range Status   MRSA by PCR NEGATIVE NEGATIVE Final    Comment:        The GeneXpert MRSA Assay (FDA approved for NASAL specimens only), is one component of a comprehensive MRSA colonization surveillance program. It is not intended to diagnose MRSA infection nor to guide or monitor treatment for MRSA infections.      Radiological Exams on Admission: Dg Thoracic Spine 2 View  Result Date: 11/20/2016 CLINICAL DATA:  Fall EXAM: THORACIC SPINE 2 VIEWS COMPARISON:  Chest CT 11/16/2016 FINDINGS: There is no evidence of thoracic spine fracture. Alignment is normal. Multilevel chronic vertebral body height loss. IMPRESSION: No acute abnormality. Electronically Signed   By: Deatra Robinson M.D.   On: 11/20/2016 02:48   Dg Lumbar Spine 2-3 Views  Result Date: 11/20/2016 CLINICAL DATA:  Back pain after a fall. EXAM: LUMBAR SPINE - 2-3 VIEW COMPARISON:  CT chest abdomen and pelvis 11/16/2006 FINDINGS: Normal alignment of the lumbar spine. Ballooning of interspaces with endplate depression deformities at T12-L1, L1-2, L2-3, L3-4, and L4-5 levels, likely representing changes due to osteoporosis. There is slight anterior cortical irregularity of the superior endplate of L2 could represent acute fracture. No focal bone lesion or bone  destruction. Visualized sacrum appears intact. Vascular calcifications. Postoperative changes in the right hip. IMPRESSION: Multiple endplate compression deformities with ballooning of interspaces likely representing osteoporotic change. Slight anterior cortical irregularity of the superior endplate of L2 could represent acute compression fracture. No displacement or retropulsion of fracture fragments. Electronically Signed   By: Burman Nieves M.D.   On: 11/20/2016 02:51   Dg Wrist Complete Left  Result Date: 11/20/2016 CLINICAL DATA:  Left arm pain after a fall. Back pain. History of dementia. EXAM: LEFT WRIST - COMPLETE 3+ VIEW COMPARISON:  None. FINDINGS: Compression deformity of the trapezium is associated with degenerative change, probably representing chronic process rather than acute fracture. No definite evidence of any acute fracture or dislocation in the left wrist. Soft tissues are unremarkable. IMPRESSION: Compression deformity of the trapezium with associated degenerative changes probably representing old deformity. No definite evidence of acute fracture. Electronically Signed   By: Burman Nieves M.D.   On: 11/20/2016 02:48   Ct Head Wo Contrast  Result Date: 11/20/2016 CLINICAL DATA:  Post fall. Altered mental status and combative since found. Dementia. EXAM: CT HEAD WITHOUT CONTRAST CT CERVICAL SPINE WITHOUT CONTRAST TECHNIQUE: Multidetector CT imaging of the head and cervical spine was performed following the standard protocol without intravenous contrast. Multiplanar CT image reconstructions of the cervical spine were also generated. COMPARISON:  11/20/2016 at 1:13 a.m. and 08/29/2016 FINDINGS: CT HEAD FINDINGS Brain: Ventricles and cisterns are unchanged. CSF spaces are within normal. There is evidence of an old right sided infarct involving the temporal  and posterior parietal region. Associated dilatation of the atria and temporal horn of the right lateral ventricle. Mild chronic  ischemic microvascular disease. Old left cerebellar infarct. No mass, mass effect, shift of midline structures or acute hemorrhage. No evidence of acute infarction. Vascular: Calcified plaque over the cavernous segment of the internal carotid arteries. Skull: Within normal. Sinuses/Orbits: Within normal. Other: None. CT CERVICAL SPINE FINDINGS Alignment: Within normal. The atlantoaxial articulation is within normal. Skull base and vertebrae: Moderate spondylosis throughout the cervical spine with prominent anterior osteophytes. Mild uncovertebral joint spurring. No acute fracture. Soft tissues and spinal canal: Spinal canal is within normal. Prevertebral soft tissues are normal. Disc levels:  Within normal. Upper chest: Within normal. Other: Moderate calcified plaque over the carotid bifurcations. IMPRESSION: No acute intracranial findings. 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 No acute cervical spine injury. Moderate spondylosis throughout the cervical spine. Electronically Signed   By: Elberta Fortis M.D.   On: 11/20/2016 15:47   Ct Head Wo Contrast  Result Date: 11/20/2016 CLINICAL DATA:  Larey Seat tonight twice. No known head injury. Patient is on blood thinners. History of dementia. EXAM: CT HEAD WITHOUT CONTRAST CT CERVICAL SPINE WITHOUT CONTRAST TECHNIQUE: Multidetector CT imaging of the head and cervical spine was performed following the standard protocol without intravenous contrast. Multiplanar CT image reconstructions of the cervical spine were also generated. COMPARISON:  08/29/2016 FINDINGS: CT HEAD FINDINGS Brain: Old area of encephalomalacia involving the right temporal and parietal lobes consistent with old infarct in the distribution of the right middle cerebral artery. There is associated dilatation of the temporal and posterior horns of the right lateral ventricle due to volume loss. Old left inferior cerebellar infarct. Diffuse cerebral  atrophy. Ventricular dilatation consistent with central atrophy. Low-attenuation changes in the deep white matter consistent with small vessel ischemia. No mass effect or midline shift. No abnormal extra-axial fluid collections. Gray-white matter junctions are distinct. Basal cisterns are not effaced. No acute intracranial hemorrhage. Vascular: Vascular calcifications are present. Skull: Calvarium appears intact. Sinuses/Orbits: Paranasal sinuses and mastoid air cells are clear. Other: No significant changes since previous study. CT CERVICAL SPINE FINDINGS Alignment: Normal alignment of the cervical vertebrae and facet joints. C1-2 articulation appears intact. Ligamentous and soft tissue calcification at the C1-2 level consistent with degenerative change. Skull base and vertebrae: No vertebral compression deformities. No focal bone lesion or bone destruction. Soft tissues and spinal canal: No prevertebral fluid or swelling. No visible canal hematoma. Disc levels: Diffuse degenerative change throughout the cervical spine with narrowed interspaces and endplate hypertrophic changes. Prominent bridging osteophytes from C3 through C7. Degenerative changes throughout the facet joints. Upper chest: Negative. Other: None. IMPRESSION: No acute intracranial abnormalities. Diffuse chronic small vessel ischemia and atrophy. Old infarcts in the right temporal parietal and left inferior cerebellar regions. Normal alignment of the cervical spine. Diffuse degenerative change throughout. No acute displaced fractures identified. Electronically Signed   By: Burman Nieves M.D.   On: 11/20/2016 02:38   Ct Cervical Spine Wo Contrast  Result Date: 11/20/2016 CLINICAL DATA:  Post fall. Altered mental status and combative since found. Dementia. EXAM: CT HEAD WITHOUT CONTRAST CT CERVICAL SPINE WITHOUT CONTRAST TECHNIQUE: Multidetector CT imaging of the head and cervical spine was performed following the standard protocol without  intravenous contrast. Multiplanar CT image reconstructions of the cervical spine were also generated. COMPARISON:  11/20/2016 at 1:13 a.m. and 08/29/2016 FINDINGS: CT HEAD FINDINGS Brain: Ventricles and cisterns are unchanged. CSF spaces  are within normal. There is evidence of an old right sided infarct involving the temporal and posterior parietal region. Associated dilatation of the atria and temporal horn of the right lateral ventricle. Mild chronic ischemic microvascular disease. Old left cerebellar infarct. No mass, mass effect, shift of midline structures or acute hemorrhage. No evidence of acute infarction. Vascular: Calcified plaque over the cavernous segment of the internal carotid arteries. Skull: Within normal. Sinuses/Orbits: Within normal. Other: None. CT CERVICAL SPINE FINDINGS Alignment: Within normal. The atlantoaxial articulation is within normal. Skull base and vertebrae: Moderate spondylosis throughout the cervical spine with prominent anterior osteophytes. Mild uncovertebral joint spurring. No acute fracture. Soft tissues and spinal canal: Spinal canal is within normal. Prevertebral soft tissues are normal. Disc levels:  Within normal. Upper chest: Within normal. Other: Moderate calcified plaque over the carotid bifurcations. IMPRESSION: No acute intracranial findings. 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 No acute cervical spine injury. Moderate spondylosis throughout the cervical spine. Electronically Signed   By: Elberta Fortis M.D.   On: 11/20/2016 15:47   Ct Cervical Spine Wo Contrast  Result Date: 11/20/2016 CLINICAL DATA:  Larey Seat tonight twice. No known head injury. Patient is on blood thinners. History of dementia. EXAM: CT HEAD WITHOUT CONTRAST CT CERVICAL SPINE WITHOUT CONTRAST TECHNIQUE: Multidetector CT imaging of the head and cervical spine was performed following the standard protocol without intravenous contrast.  Multiplanar CT image reconstructions of the cervical spine were also generated. COMPARISON:  08/29/2016 FINDINGS: CT HEAD FINDINGS Brain: Old area of encephalomalacia involving the right temporal and parietal lobes consistent with old infarct in the distribution of the right middle cerebral artery. There is associated dilatation of the temporal and posterior horns of the right lateral ventricle due to volume loss. Old left inferior cerebellar infarct. Diffuse cerebral atrophy. Ventricular dilatation consistent with central atrophy. Low-attenuation changes in the deep white matter consistent with small vessel ischemia. No mass effect or midline shift. No abnormal extra-axial fluid collections. Gray-white matter junctions are distinct. Basal cisterns are not effaced. No acute intracranial hemorrhage. Vascular: Vascular calcifications are present. Skull: Calvarium appears intact. Sinuses/Orbits: Paranasal sinuses and mastoid air cells are clear. Other: No significant changes since previous study. CT CERVICAL SPINE FINDINGS Alignment: Normal alignment of the cervical vertebrae and facet joints. C1-2 articulation appears intact. Ligamentous and soft tissue calcification at the C1-2 level consistent with degenerative change. Skull base and vertebrae: No vertebral compression deformities. No focal bone lesion or bone destruction. Soft tissues and spinal canal: No prevertebral fluid or swelling. No visible canal hematoma. Disc levels: Diffuse degenerative change throughout the cervical spine with narrowed interspaces and endplate hypertrophic changes. Prominent bridging osteophytes from C3 through C7. Degenerative changes throughout the facet joints. Upper chest: Negative. Other: None. IMPRESSION: No acute intracranial abnormalities. Diffuse chronic small vessel ischemia and atrophy. Old infarcts in the right temporal parietal and left inferior cerebellar regions. Normal alignment of the cervical spine. Diffuse degenerative  change throughout. No acute displaced fractures identified. Electronically Signed   By: Burman Nieves M.D.   On: 11/20/2016 02:38    EKG: Independently reviewed. Sinus bradycardia, prolonged PR, incomplete LBBB   Assessment/Plan Principal Problem:   Acute encephalopathy Active Problems:   BPH (benign prostatic hyperplasia)   Seizure disorder (HCC)   Dementia with behavioral disturbance   Atrial fibrillation and flutter (HCC)   Dyslipidemia  Acute encephalopathy -With hx of dementia  -Patient was recently admitted and discharged on 3/14 due to toxic  metabolic encephalopathy of unclear etiology, most likely due to polypharmacy. Patient had been on multiple medications at the time which were changed at time of discharge.  -Urinalysis negative for infectious process  -Ammonia level is slightly elevated at 49, LFT pending  -Tele sitter  -Neuro checks  -Will have to continue to monitor progress, unable to do accurate exam as patient somnolent after receiving haldol and geodon in ED. Unclear etiology as of now.   Paroxysmal atrial fibrillation, with bradycardia  -Chadsvasc greater than 3. Had been on Eliquis in the past. At last admission, risks and benefits were discussed. Due to multiple falls, anticoagulation was deemed too high risk. Continue aspirin -After admission was called, patient became bradycardic in the ED. When patient is aroused, HR does go up into the high 50s, then returns to 30-40s. Per sister-in-law, patient was admitted to [facility] in late December 2017 due to A fib and low HR. At the time, pacemaker was discussed but family decided not to pursue it. He eventually became stable and was discharged at that time  -?Could be secondary to drug interaction, as his bradycardia started after haldol and geodon administration  -Obtain serial EKG, monitor QT interval -Check TSH, T4 -Continue tele -Obtain records from [facility]   Bilateral peripheral edema -Check echo,  BNP   Multiple falls -Last admission, patient and family had refused SNF placement and was discharged to ALF with [facility] services  -CT head and cervical spine without acute injury  -Lumbar xray with possible L2 acute compression fracture. Unable to adequately assess for pain at time of admission -PT OT to evaluate   Chronic normocytic anemia -Work up consistent with anemia chronic disease   Seizures -Continue Lamictal  HLD -Pravachol   BPH -Continue finasteride   DVT prophylaxis: lovenox  Code Status: DNR  Family Communication: spoke with sister in law/guardian over the phone at time of admission Disposition Plan: pending stabilization, ALF vs SNF  Consults called: none   Admission status: observation   Noralee Stain, DO [facility] [email or web address] Password [facility] 11/20/2016, 5:37 PM

## 2016-11-20 NOTE — ED Notes (Addendum)
Pt moved to hallway, [facility]

## 2016-11-20 NOTE — ED Notes (Signed)
Patient transported to CT 

## 2016-11-20 NOTE — ED Notes (Addendum)
Pt stood at bedside with moderate assistance from nursing staff. PA notified.

## 2016-11-20 NOTE — ED Provider Notes (Signed)
[facility]-EMERGENCY DEPT Provider Note   CSN: [id number] Arrival date & time: 11/20/16  1317     History   Chief Complaint Chief Complaint  Patient presents with  . Fall    HPI [name] is a 71 y.o. male.  HPI Patient presents to the emergency department with altered mental status and combativeness.  The patient was seen earlier in the emergency department following a fall.  Patient is sent diabetes.  He had another fall and became combative and more altered than his baseline.  Patient is unable to give me any history. Past Medical History:  Diagnosis Date  . A-fib (HCC)   . BPH (benign prostatic hyperplasia)   . Coronary artery disease   . Dementia with behavioral disturbance 11/01/2015  . Glaucoma   . H/O hiatal hernia   . Hypertension   . LBBB (left bundle branch block)   . Rib fracture 09/18/2012  . Seizures (HCC)    "h/o gran mal & petit" (08/07/2012)  . Stroke [facility]) 1980's   "mini w/seizure at the same time" (08/07/2012)  . Suicidal ideation   . Syncope and collapse 08/07/2012   "w/loss of consciousness; found down; don't know how long he was out" (08/07/2012)    Patient Active Problem List   Diagnosis Date Noted  . Acute encephalopathy 11/20/2016  . Toxic metabolic encephalopathy 11/16/2016  . Weight loss, non-intentional 03/03/2016  . Acute upper respiratory infection 03/03/2016  . Essential hypertension 03/03/2016  . Hyperlipidemia 03/03/2016  . Abnormal echocardiogram 03/03/2016  . Ventral hernia without obstruction or gangrene 01/26/2016  . Hypertensive heart disease 12/31/2015  . Atrial fibrillation and flutter (HCC) 12/31/2015  . Left ventricular dysfunction 12/31/2015  . CAD (coronary artery disease) 12/31/2015  . Anemia, iron deficiency 12/31/2015  . GERD without esophagitis 12/31/2015  . Dyslipidemia 12/31/2015  . H/O: glaucoma 11/04/2015  . H/O coronary artery bypass surgery 11/04/2015  . Major depressive disorder 11/03/2015  . Dementia with  behavioral disturbance 11/01/2015  . Depression, major, single episode, severe (HCC) 11/01/2015  . Seizure disorder (HCC) 08/09/2012  . BPH (benign prostatic hyperplasia)     Past Surgical History:  Procedure Laterality Date  . BRAIN SURGERY    . CORONARY ARTERY BYPASS GRAFT  ~ 2010   CABG X1  . TIBIA FRACTURE SURGERY  1960's   RLE       Home Medications    Prior to Admission medications   Medication Sig Start Date End Date Taking? Authorizing Provider  albuterol (PROVENTIL HFA;VENTOLIN HFA) 108 (90 Base) MCG/ACT inhaler Inhale 2 puffs into the lungs every 6 (six) hours as needed for wheezing or shortness of breath.    Historical Provider, MD  aspirin 81 MG tablet Take 81 mg by mouth daily with breakfast.    Historical Provider, MD  brimonidine (ALPHAGAN) 0.2 % ophthalmic solution Place 1 drop into both eyes 2 (two) times daily. Reported on 01/05/2016    Historical Provider, MD  calcium carbonate (TUMS - DOSED IN MG ELEMENTAL CALCIUM) 500 MG chewable tablet Chew 1 tablet by mouth at bedtime.    Historical Provider, MD  carboxymethylcellulose 1 % ophthalmic solution Place 1 drop into both eyes 5 (five) times daily.    Historical Provider, MD  dorzolamide-timolol (COSOPT) 22.3-6.8 MG/ML ophthalmic solution Place 1 drop into both eyes 2 (two) times daily.    Historical Provider, MD  escitalopram (LEXAPRO) 5 MG tablet Take 1 tablet (5 mg total) by mouth daily with breakfast. 11/18/16   Rhetta Mura,  MD  ferrous sulfate 325 (65 FE) MG tablet Take 325 mg by mouth 3 (three) times daily with meals.    Historical Provider, MD  finasteride (PROSCAR) 5 MG tablet Take 5 mg by mouth daily with breakfast.     Historical Provider, MD  lamoTRIgine (LAMICTAL) 200 MG tablet Take 200 mg by mouth 2 (two) times daily.    Historical Provider, MD  latanoprost (XALATAN) 0.005 % ophthalmic solution Place 1 drop into both eyes at bedtime.    Historical Provider, MD  Multiple Vitamin (MULTIVITAMIN WITH  MINERALS) TABS tablet Take 1 tablet by mouth daily.    Historical Provider, MD  omeprazole (PRILOSEC) 20 MG capsule Take 20 mg by mouth daily with breakfast.    Historical Provider, MD  pravastatin (PRAVACHOL) 40 MG tablet Take 40 mg by mouth daily.    Historical Provider, MD  senna-docusate (SENOKOT-S) 8.6-50 MG tablet Take 2 tablets by mouth daily with breakfast.    Historical Provider, MD    Family History Family History  Problem Relation Age of Onset  . Heart disease      No family history    Social History Social History  Substance Use Topics  . Smoking status: Never Smoker  . Smokeless tobacco: Never Used  . Alcohol use No     Allergies   Codeine   Review of Systems Review of Systems Level 5 Caveat applies due to altered mental status  Physical Exam Updated Vital Signs BP (!) 91/54 (BP Location: Right Arm)   Pulse (!) 49   Resp 12   SpO2 96%   Physical Exam  Constitutional: He appears well-developed and well-nourished. No distress.  HENT:  Head: Normocephalic and atraumatic.  Eyes: Pupils are equal, round, and reactive to light.  Neck: Normal range of motion. Neck supple.  Cardiovascular: Normal rate, regular rhythm and normal heart sounds.  Exam reveals no gallop and no friction rub.   No murmur heard. Pulmonary/Chest: Effort normal and breath sounds normal. No respiratory distress. He has no wheezes.    Neurological:  Shows too combative to assess his neurological function and status  Skin: Skin is warm and dry. Capillary refill takes less than 2 seconds. No rash noted. No erythema.  Psychiatric: He has a normal mood and affect. His behavior is normal.  Nursing note and vitals reviewed.    ED Treatments / Results  Labs (all labs ordered are listed, but only abnormal results are displayed) Labs Reviewed  CBC WITH DIFFERENTIAL/PLATELET - Abnormal; Notable for the following:       Result Value   RBC 3.60 (*)    Hemoglobin 9.3 (*)    HCT 30.1 (*)      MCH 25.8 (*)    RDW 17.7 (*)    Lymphs Abs 0.6 (*)    All other components within normal limits  BASIC METABOLIC PANEL - Abnormal; Notable for the following:    CO2 20 (*)    All other components within normal limits  AMMONIA - Abnormal; Notable for the following:    Ammonia 49 (*)    All other components within normal limits  CK - Abnormal; Notable for the following:    Total CK 435 (*)    All other components within normal limits  I-STAT CG4 LACTIC ACID, ED - Abnormal; Notable for the following:    Lactic Acid, Venous 2.52 (*)    All other components within normal limits  URINALYSIS, ROUTINE W REFLEX MICROSCOPIC  HEPATIC FUNCTION PANEL  EKG  EKG Interpretation None       Radiology Dg Thoracic Spine 2 View  Result Date: 11/20/2016 CLINICAL DATA:  Fall EXAM: THORACIC SPINE 2 VIEWS COMPARISON:  Chest CT 11/16/2016 FINDINGS: There is no evidence of thoracic spine fracture. Alignment is normal. Multilevel chronic vertebral body height loss. IMPRESSION: No acute abnormality. Electronically Signed   By: Deatra Robinson M.D.   On: 11/20/2016 02:48   Dg Lumbar Spine 2-3 Views  Result Date: 11/20/2016 CLINICAL DATA:  Back pain after a fall. EXAM: LUMBAR SPINE - 2-3 VIEW COMPARISON:  CT chest abdomen and pelvis 11/16/2006 FINDINGS: Normal alignment of the lumbar spine. Ballooning of interspaces with endplate depression deformities at T12-L1, L1-2, L2-3, L3-4, and L4-5 levels, likely representing changes due to osteoporosis. There is slight anterior cortical irregularity of the superior endplate of L2 could represent acute fracture. No focal bone lesion or bone destruction. Visualized sacrum appears intact. Vascular calcifications. Postoperative changes in the right hip. IMPRESSION: Multiple endplate compression deformities with ballooning of interspaces likely representing osteoporotic change. Slight anterior cortical irregularity of the superior endplate of L2 could represent acute  compression fracture. No displacement or retropulsion of fracture fragments. Electronically Signed   By: Burman Nieves M.D.   On: 11/20/2016 02:51   Dg Wrist Complete Left  Result Date: 11/20/2016 CLINICAL DATA:  Left arm pain after a fall. Back pain. History of dementia. EXAM: LEFT WRIST - COMPLETE 3+ VIEW COMPARISON:  None. FINDINGS: Compression deformity of the trapezium is associated with degenerative change, probably representing chronic process rather than acute fracture. No definite evidence of any acute fracture or dislocation in the left wrist. Soft tissues are unremarkable. IMPRESSION: Compression deformity of the trapezium with associated degenerative changes probably representing old deformity. No definite evidence of acute fracture. Electronically Signed   By: Burman Nieves M.D.   On: 11/20/2016 02:48   Ct Head Wo Contrast  Result Date: 11/20/2016 CLINICAL DATA:  Post fall. Altered mental status and combative since found. Dementia. EXAM: CT HEAD WITHOUT CONTRAST CT CERVICAL SPINE WITHOUT CONTRAST TECHNIQUE: Multidetector CT imaging of the head and cervical spine was performed following the standard protocol without intravenous contrast. Multiplanar CT image reconstructions of the cervical spine were also generated. COMPARISON:  11/20/2016 at 1:13 a.m. and 08/29/2016 FINDINGS: CT HEAD FINDINGS Brain: Ventricles and cisterns are unchanged. CSF spaces are within normal. There is evidence of an old right sided infarct involving the temporal and posterior parietal region. Associated dilatation of the atria and temporal horn of the right lateral ventricle. Mild chronic ischemic microvascular disease. Old left cerebellar infarct. No mass, mass effect, shift of midline structures or acute hemorrhage. No evidence of acute infarction. Vascular: Calcified plaque over the cavernous segment of the internal carotid arteries. Skull: Within normal. Sinuses/Orbits: Within normal. Other: None. CT CERVICAL  SPINE FINDINGS Alignment: Within normal. The atlantoaxial articulation is within normal. Skull base and vertebrae: Moderate spondylosis throughout the cervical spine with prominent anterior osteophytes. Mild uncovertebral joint spurring. No acute fracture. Soft tissues and spinal canal: Spinal canal is within normal. Prevertebral soft tissues are normal. Disc levels:  Within normal. Upper chest: Within normal. Other: Moderate calcified plaque over the carotid bifurcations. IMPRESSION: No acute intracranial findings. 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 No acute cervical spine injury. Moderate spondylosis throughout the cervical spine. Electronically Signed   By: Elberta Fortis M.D.   On: 11/20/2016 15:47   Ct Head Wo Contrast  Result Date:  11/20/2016 CLINICAL DATA:  Larey Seat tonight twice. No known head injury. Patient is on blood thinners. History of dementia. EXAM: CT HEAD WITHOUT CONTRAST CT CERVICAL SPINE WITHOUT CONTRAST TECHNIQUE: Multidetector CT imaging of the head and cervical spine was performed following the standard protocol without intravenous contrast. Multiplanar CT image reconstructions of the cervical spine were also generated. COMPARISON:  08/29/2016 FINDINGS: CT HEAD FINDINGS Brain: Old area of encephalomalacia involving the right temporal and parietal lobes consistent with old infarct in the distribution of the right middle cerebral artery. There is associated dilatation of the temporal and posterior horns of the right lateral ventricle due to volume loss. Old left inferior cerebellar infarct. Diffuse cerebral atrophy. Ventricular dilatation consistent with central atrophy. Low-attenuation changes in the deep white matter consistent with small vessel ischemia. No mass effect or midline shift. No abnormal extra-axial fluid collections. Gray-white matter junctions are distinct. Basal cisterns are not effaced. No acute intracranial  hemorrhage. Vascular: Vascular calcifications are present. Skull: Calvarium appears intact. Sinuses/Orbits: Paranasal sinuses and mastoid air cells are clear. Other: No significant changes since previous study. CT CERVICAL SPINE FINDINGS Alignment: Normal alignment of the cervical vertebrae and facet joints. C1-2 articulation appears intact. Ligamentous and soft tissue calcification at the C1-2 level consistent with degenerative change. Skull base and vertebrae: No vertebral compression deformities. No focal bone lesion or bone destruction. Soft tissues and spinal canal: No prevertebral fluid or swelling. No visible canal hematoma. Disc levels: Diffuse degenerative change throughout the cervical spine with narrowed interspaces and endplate hypertrophic changes. Prominent bridging osteophytes from C3 through C7. Degenerative changes throughout the facet joints. Upper chest: Negative. Other: None. IMPRESSION: No acute intracranial abnormalities. Diffuse chronic small vessel ischemia and atrophy. Old infarcts in the right temporal parietal and left inferior cerebellar regions. Normal alignment of the cervical spine. Diffuse degenerative change throughout. No acute displaced fractures identified. Electronically Signed   By: Burman Nieves M.D.   On: 11/20/2016 02:38   Ct Cervical Spine Wo Contrast  Result Date: 11/20/2016 CLINICAL DATA:  Post fall. Altered mental status and combative since found. Dementia. EXAM: CT HEAD WITHOUT CONTRAST CT CERVICAL SPINE WITHOUT CONTRAST TECHNIQUE: Multidetector CT imaging of the head and cervical spine was performed following the standard protocol without intravenous contrast. Multiplanar CT image reconstructions of the cervical spine were also generated. COMPARISON:  11/20/2016 at 1:13 a.m. and 08/29/2016 FINDINGS: CT HEAD FINDINGS Brain: Ventricles and cisterns are unchanged. CSF spaces are within normal. There is evidence of an old right sided infarct involving the temporal  and posterior parietal region. Associated dilatation of the atria and temporal horn of the right lateral ventricle. Mild chronic ischemic microvascular disease. Old left cerebellar infarct. No mass, mass effect, shift of midline structures or acute hemorrhage. No evidence of acute infarction. Vascular: Calcified plaque over the cavernous segment of the internal carotid arteries. Skull: Within normal. Sinuses/Orbits: Within normal. Other: None. CT CERVICAL SPINE FINDINGS Alignment: Within normal. The atlantoaxial articulation is within normal. Skull base and vertebrae: Moderate spondylosis throughout the cervical spine with prominent anterior osteophytes. Mild uncovertebral joint spurring. No acute fracture. Soft tissues and spinal canal: Spinal canal is within normal. Prevertebral soft tissues are normal. Disc levels:  Within normal. Upper chest: Within normal. Other: Moderate calcified plaque over the carotid bifurcations. IMPRESSION: No acute intracranial findings. 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 No acute cervical spine injury. Moderate spondylosis throughout the cervical spine. Electronically Signed   By: Elberta Fortis M.D.  On: 11/20/2016 15:47   Ct Cervical Spine Wo Contrast  Result Date: 11/20/2016 CLINICAL DATA:  Larey SeatFell tonight twice. No known head injury. Patient is on blood thinners. History of dementia. EXAM: CT HEAD WITHOUT CONTRAST CT CERVICAL SPINE WITHOUT CONTRAST TECHNIQUE: Multidetector CT imaging of the head and cervical spine was performed following the standard protocol without intravenous contrast. Multiplanar CT image reconstructions of the cervical spine were also generated. COMPARISON:  08/29/2016 FINDINGS: CT HEAD FINDINGS Brain: Old area of encephalomalacia involving the right temporal and parietal lobes consistent with old infarct in the distribution of the right middle cerebral artery. There is associated dilatation of  the temporal and posterior horns of the right lateral ventricle due to volume loss. Old left inferior cerebellar infarct. Diffuse cerebral atrophy. Ventricular dilatation consistent with central atrophy. Low-attenuation changes in the deep white matter consistent with small vessel ischemia. No mass effect or midline shift. No abnormal extra-axial fluid collections. Gray-white matter junctions are distinct. Basal cisterns are not effaced. No acute intracranial hemorrhage. Vascular: Vascular calcifications are present. Skull: Calvarium appears intact. Sinuses/Orbits: Paranasal sinuses and mastoid air cells are clear. Other: No significant changes since previous study. CT CERVICAL SPINE FINDINGS Alignment: Normal alignment of the cervical vertebrae and facet joints. C1-2 articulation appears intact. Ligamentous and soft tissue calcification at the C1-2 level consistent with degenerative change. Skull base and vertebrae: No vertebral compression deformities. No focal bone lesion or bone destruction. Soft tissues and spinal canal: No prevertebral fluid or swelling. No visible canal hematoma. Disc levels: Diffuse degenerative change throughout the cervical spine with narrowed interspaces and endplate hypertrophic changes. Prominent bridging osteophytes from C3 through C7. Degenerative changes throughout the facet joints. Upper chest: Negative. Other: None. IMPRESSION: No acute intracranial abnormalities. Diffuse chronic small vessel ischemia and atrophy. Old infarcts in the right temporal parietal and left inferior cerebellar regions. Normal alignment of the cervical spine. Diffuse degenerative change throughout. No acute displaced fractures identified. Electronically Signed   By: Burman NievesWilliam  Stevens M.D.   On: 11/20/2016 02:38    Procedures Procedures (including critical care time)  Medications Ordered in ED Medications  ziprasidone (GEODON) 20 MG injection (20 mg  Given 11/20/16 1350)  sterile water (preservative  free) injection (2 mLs  Given 11/20/16 1350)     Initial Impression / Assessment and Plan / ED Course  I have reviewed the triage vital signs and the nursing notes.  Pertinent labs & imaging results that were available during my care of the patient were reviewed by me and considered in my medical decision making (see chart for details).     Patient will be admitted to the hospital as I am unable to examine him accurately, as this could just be worsening of his chronic dementia.  Hospitalist will admit the patient  Final Clinical Impressions(s) / ED Diagnoses   Final diagnoses:  None    New Prescriptions New Prescriptions   No medications on file     Charlestine NightChristopher Maikayla Beggs, PA-C 11/20/16 1634    Rolland PorterMark James, MD 11/23/16 1316

## 2016-11-20 NOTE — ED Provider Notes (Signed)
[facility] DEPT Provider Note   CSN: [id number] Arrival date & time: 11/20/16  0005     History   Chief Complaint Chief Complaint  Patient presents with  . Fall    on thinners    HPI [name] is a 71 y.o. male with PMH/o Afib, Dementia who presents via EMS from [facility] for a fall. Per facility, patient had gotten up to walk to the bathroom and was found on the floor several minutes later. The fall was unwitnessed and there is questionable LOC. At facility patient was complaining of head pain and neck and back pain.   The history is limited by the condition of the patient.    Past Medical History:  Diagnosis Date  . A-fib (HCC)   . BPH (benign prostatic hyperplasia)   . Coronary artery disease   . Dementia with behavioral disturbance 11/01/2015  . Glaucoma   . H/O hiatal hernia   . Hypertension   . LBBB (left bundle branch block)   . Rib fracture 09/18/2012  . Seizures (HCC)    "h/o gran mal & petit" (08/07/2012)  . Stroke [facility]) 1980's   "mini w/seizure at the same time" (08/07/2012)  . Suicidal ideation   . Syncope and collapse 08/07/2012   "w/loss of consciousness; found down; don't know how long he was out" (08/07/2012)    Patient Active Problem List   Diagnosis Date Noted  . Toxic metabolic encephalopathy 11/16/2016  . Weight loss, non-intentional 03/03/2016  . Acute upper respiratory infection 03/03/2016  . Essential hypertension 03/03/2016  . Hyperlipidemia 03/03/2016  . Abnormal echocardiogram 03/03/2016  . Ventral hernia without obstruction or gangrene 01/26/2016  . Hypertensive heart disease 12/31/2015  . Atrial fibrillation and flutter (HCC) 12/31/2015  . Left ventricular dysfunction 12/31/2015  . CAD (coronary artery disease) 12/31/2015  . Anemia, iron deficiency 12/31/2015  . GERD without esophagitis 12/31/2015  . Dyslipidemia 12/31/2015  . H/O: glaucoma 11/04/2015  . H/O coronary artery bypass surgery 11/04/2015  . Major  depressive disorder 11/03/2015  . Dementia with behavioral disturbance 11/01/2015  . Depression, major, single episode, severe (HCC) 11/01/2015  . Seizure disorder (HCC) 08/09/2012  . BPH (benign prostatic hyperplasia)     Past Surgical History:  Procedure Laterality Date  . BRAIN SURGERY    . CORONARY ARTERY BYPASS GRAFT  ~ 2010   CABG X1  . TIBIA FRACTURE SURGERY  1960's   RLE       Home Medications    Prior to Admission medications   Medication Sig Start Date End Date Taking? Authorizing Provider  albuterol (PROVENTIL HFA;VENTOLIN HFA) 108 (90 Base) MCG/ACT inhaler Inhale 2 puffs into the lungs every 6 (six) hours as needed for wheezing or shortness of breath.   Yes Historical Provider, MD  aspirin 81 MG tablet Take 81 mg by mouth daily with breakfast.   Yes Historical Provider, MD  brimonidine (ALPHAGAN) 0.2 % ophthalmic solution Place 1 drop into both eyes 2 (two) times daily. Reported on 01/05/2016   Yes Historical Provider, MD  calcium carbonate (TUMS - DOSED IN MG ELEMENTAL CALCIUM) 500 MG chewable tablet Chew 1 tablet by mouth at bedtime.   Yes Historical Provider, MD  carboxymethylcellulose 1 % ophthalmic solution Place 1 drop into both eyes 5 (five) times daily.   Yes Historical Provider, MD  dorzolamide-timolol (COSOPT) 22.3-6.8 MG/ML ophthalmic solution Place 1 drop into both eyes 2 (two) times daily.   Yes Historical Provider, MD  escitalopram (LEXAPRO) 5 MG tablet  Take 1 tablet (5 mg total) by mouth daily with breakfast. 11/18/16  Yes Rhetta Mura, MD  ferrous sulfate 325 (65 FE) MG tablet Take 325 mg by mouth 3 (three) times daily with meals.   Yes Historical Provider, MD  finasteride (PROSCAR) 5 MG tablet Take 5 mg by mouth daily with breakfast.    Yes Historical Provider, MD  lamoTRIgine (LAMICTAL) 200 MG tablet Take 200 mg by mouth 2 (two) times daily.   Yes Historical Provider, MD  latanoprost (XALATAN) 0.005 % ophthalmic solution Place 1 drop into both eyes  at bedtime.   Yes Historical Provider, MD  Multiple Vitamin (MULTIVITAMIN WITH MINERALS) TABS tablet Take 1 tablet by mouth daily.   Yes Historical Provider, MD  omeprazole (PRILOSEC) 20 MG capsule Take 20 mg by mouth daily with breakfast.   Yes Historical Provider, MD  pravastatin (PRAVACHOL) 40 MG tablet Take 40 mg by mouth daily.   Yes Historical Provider, MD  senna-docusate (SENOKOT-S) 8.6-50 MG tablet Take 2 tablets by mouth daily with breakfast.   Yes Historical Provider, MD    Family History Family History  Problem Relation Age of Onset  . Heart disease      No family history    Social History Social History  Substance Use Topics  . Smoking status: Never Smoker  . Smokeless tobacco: Never Used  . Alcohol use No     Allergies   Codeine   Review of Systems Review of Systems  Unable to perform ROS: Dementia     Physical Exam Updated Vital Signs BP (!) 160/86   Pulse 66   SpO2 96%   Physical Exam  Constitutional: He appears well-developed and well-nourished. He is easily aroused. Cervical collar in place.  HENT:  Head: Normocephalic and atraumatic. Head is without abrasion and without laceration.  Right Ear: Tympanic membrane normal. No hemotympanum.  Left Ear: No hemotympanum.  Mouth/Throat: Oropharynx is clear and moist and mucous membranes are normal.  Left TM very ceruminous  Eyes: Conjunctivae, EOM and lids are normal. Pupils are equal, round, and reactive to light.  Neck:  C-Collar in place   Cardiovascular: Normal rate, regular rhythm, normal heart sounds and normal pulses.  Exam reveals no gallop and no friction rub.   No murmur heard. Pulmonary/Chest: Effort normal and breath sounds normal.  Abdominal: Soft. Normal appearance. There is no tenderness. There is no rigidity and no guarding.  Musculoskeletal: Normal range of motion.       Left wrist: He exhibits tenderness.       Cervical back: He exhibits tenderness.       Lumbar back: He exhibits  tenderness.  Moderate soft tissue swelling to the dorsal aspect of the left wrist with overlying ecchymosis that appears to be old. Mild tenderness to palpation of left wrist. No deformity seen.   Neurological: He is alert and easily aroused.  Follows commands.  Responds to questions with mumbling that is hard to understand.  Alert and can tell me his name, but responds with "[birth year]" when asked the year. Unable to distinguish answer when asked for place.  Symmetric strength BUE and BLE.  CN III-XII intact  Skin: Skin is warm and dry. Capillary refill takes less than 2 seconds.  Psychiatric: He has a normal mood and affect. His speech is normal.  Nursing note and vitals reviewed.    ED Treatments / Results  Labs (all labs ordered are listed, but only abnormal results are displayed) Labs Reviewed  CBC -  Abnormal; Notable for the following:       Result Value   RBC 3.89 (*)    Hemoglobin 10.3 (*)    HCT 32.7 (*)    RDW 18.5 (*)    All other components within normal limits  PROTIME-INR - Abnormal; Notable for the following:    Prothrombin Time 24.8 (*)    All other components within normal limits  BASIC METABOLIC PANEL - Abnormal; Notable for the following:    CO2 18 (*)    Calcium 8.8 (*)    All other components within normal limits    EKG  EKG Interpretation None       Radiology Dg Thoracic Spine 2 View  Result Date: 11/20/2016 CLINICAL DATA:  Fall EXAM: THORACIC SPINE 2 VIEWS COMPARISON:  Chest CT 11/16/2016 FINDINGS: There is no evidence of thoracic spine fracture. Alignment is normal. Multilevel chronic vertebral body height loss. IMPRESSION: No acute abnormality. Electronically Signed   By: Deatra Robinson M.D.   On: 11/20/2016 02:48   Dg Lumbar Spine 2-3 Views  Result Date: 11/20/2016 CLINICAL DATA:  Back pain after a fall. EXAM: LUMBAR SPINE - 2-3 VIEW COMPARISON:  CT chest abdomen and pelvis 11/16/2006 FINDINGS: Normal alignment of the lumbar spine. Ballooning of  interspaces with endplate depression deformities at T12-L1, L1-2, L2-3, L3-4, and L4-5 levels, likely representing changes due to osteoporosis. There is slight anterior cortical irregularity of the superior endplate of L2 could represent acute fracture. No focal bone lesion or bone destruction. Visualized sacrum appears intact. Vascular calcifications. Postoperative changes in the right hip. IMPRESSION: Multiple endplate compression deformities with ballooning of interspaces likely representing osteoporotic change. Slight anterior cortical irregularity of the superior endplate of L2 could represent acute compression fracture. No displacement or retropulsion of fracture fragments. Electronically Signed   By: Burman Nieves M.D.   On: 11/20/2016 02:51   Dg Wrist Complete Left  Result Date: 11/20/2016 CLINICAL DATA:  Left arm pain after a fall. Back pain. History of dementia. EXAM: LEFT WRIST - COMPLETE 3+ VIEW COMPARISON:  None. FINDINGS: Compression deformity of the trapezium is associated with degenerative change, probably representing chronic process rather than acute fracture. No definite evidence of any acute fracture or dislocation in the left wrist. Soft tissues are unremarkable. IMPRESSION: Compression deformity of the trapezium with associated degenerative changes probably representing old deformity. No definite evidence of acute fracture. Electronically Signed   By: Burman Nieves M.D.   On: 11/20/2016 02:48   Ct Head Wo Contrast  Result Date: 11/20/2016 CLINICAL DATA:  Larey Seat tonight twice. No known head injury. Patient is on blood thinners. History of dementia. EXAM: CT HEAD WITHOUT CONTRAST CT CERVICAL SPINE WITHOUT CONTRAST TECHNIQUE: Multidetector CT imaging of the head and cervical spine was performed following the standard protocol without intravenous contrast. Multiplanar CT image reconstructions of the cervical spine were also generated. COMPARISON:  08/29/2016 FINDINGS: CT HEAD FINDINGS  Brain: Old area of encephalomalacia involving the right temporal and parietal lobes consistent with old infarct in the distribution of the right middle cerebral artery. There is associated dilatation of the temporal and posterior horns of the right lateral ventricle due to volume loss. Old left inferior cerebellar infarct. Diffuse cerebral atrophy. Ventricular dilatation consistent with central atrophy. Low-attenuation changes in the deep white matter consistent with small vessel ischemia. No mass effect or midline shift. No abnormal extra-axial fluid collections. Gray-white matter junctions are distinct. Basal cisterns are not effaced. No acute intracranial hemorrhage. Vascular: Vascular calcifications are present.  Skull: Calvarium appears intact. Sinuses/Orbits: Paranasal sinuses and mastoid air cells are clear. Other: No significant changes since previous study. CT CERVICAL SPINE FINDINGS Alignment: Normal alignment of the cervical vertebrae and facet joints. C1-2 articulation appears intact. Ligamentous and soft tissue calcification at the C1-2 level consistent with degenerative change. Skull base and vertebrae: No vertebral compression deformities. No focal bone lesion or bone destruction. Soft tissues and spinal canal: No prevertebral fluid or swelling. No visible canal hematoma. Disc levels: Diffuse degenerative change throughout the cervical spine with narrowed interspaces and endplate hypertrophic changes. Prominent bridging osteophytes from C3 through C7. Degenerative changes throughout the facet joints. Upper chest: Negative. Other: None. IMPRESSION: No acute intracranial abnormalities. Diffuse chronic small vessel ischemia and atrophy. Old infarcts in the right temporal parietal and left inferior cerebellar regions. Normal alignment of the cervical spine. Diffuse degenerative change throughout. No acute displaced fractures identified. Electronically Signed   By: Burman NievesWilliam  Stevens M.D.   On: 11/20/2016  02:38   Ct Cervical Spine Wo Contrast  Result Date: 11/20/2016 CLINICAL DATA:  Larey SeatFell tonight twice. No known head injury. Patient is on blood thinners. History of dementia. EXAM: CT HEAD WITHOUT CONTRAST CT CERVICAL SPINE WITHOUT CONTRAST TECHNIQUE: Multidetector CT imaging of the head and cervical spine was performed following the standard protocol without intravenous contrast. Multiplanar CT image reconstructions of the cervical spine were also generated. COMPARISON:  08/29/2016 FINDINGS: CT HEAD FINDINGS Brain: Old area of encephalomalacia involving the right temporal and parietal lobes consistent with old infarct in the distribution of the right middle cerebral artery. There is associated dilatation of the temporal and posterior horns of the right lateral ventricle due to volume loss. Old left inferior cerebellar infarct. Diffuse cerebral atrophy. Ventricular dilatation consistent with central atrophy. Low-attenuation changes in the deep white matter consistent with small vessel ischemia. No mass effect or midline shift. No abnormal extra-axial fluid collections. Gray-white matter junctions are distinct. Basal cisterns are not effaced. No acute intracranial hemorrhage. Vascular: Vascular calcifications are present. Skull: Calvarium appears intact. Sinuses/Orbits: Paranasal sinuses and mastoid air cells are clear. Other: No significant changes since previous study. CT CERVICAL SPINE FINDINGS Alignment: Normal alignment of the cervical vertebrae and facet joints. C1-2 articulation appears intact. Ligamentous and soft tissue calcification at the C1-2 level consistent with degenerative change. Skull base and vertebrae: No vertebral compression deformities. No focal bone lesion or bone destruction. Soft tissues and spinal canal: No prevertebral fluid or swelling. No visible canal hematoma. Disc levels: Diffuse degenerative change throughout the cervical spine with narrowed interspaces and endplate hypertrophic  changes. Prominent bridging osteophytes from C3 through C7. Degenerative changes throughout the facet joints. Upper chest: Negative. Other: None. IMPRESSION: No acute intracranial abnormalities. Diffuse chronic small vessel ischemia and atrophy. Old infarcts in the right temporal parietal and left inferior cerebellar regions. Normal alignment of the cervical spine. Diffuse degenerative change throughout. No acute displaced fractures identified. Electronically Signed   By: Burman NievesWilliam  Stevens M.D.   On: 11/20/2016 02:38    Procedures Procedures (including critical care time)  Medications Ordered in ED Medications  ibuprofen (ADVIL,MOTRIN) tablet 600 mg (600 mg Oral Given 11/20/16 0350)     Initial Impression / Assessment and Plan / ED Course  I have reviewed the triage vital signs and the nursing notes.  Pertinent labs & imaging results that were available during my care of the patient were reviewed by me and considered in my medical decision making (see chart for details).    Unwitnessed fall at  nursing home with unknown LOC. Patient is a difficult historian due to baseline dementia and difficulties understanding him. Consider ICH vs c/t/l spine fracture. Given history of eliquis use and mechanism of injury, will check CT head to eval ICH. Will check image C spine, T spine, and L spine for evidence of fracture.   12:45 AM: [facility] where patient is a resident and spoke with Serita Grit, who is familiar with patient. Per Ms. Lyda Jester, patient was sent to the ED today after having multiple falls today. The last fall occurred at approximately 2300 and was unwitnessed. From her understanding, patient had gotten up to go to the bathroom and was found on the floor several minutes later. She is unsure if there was any LOC. Ms. Lyda Jester states that he is able to ambulate at baseline but has been progressively declining in ambulation status since his hip replacement. She does note that  patient does have dementia at baseline, but is normally alert and able to communicate, though she does note some decline in mental status since his most recent admission. Ms. Lyda Jester also reports that his lower extremity swelling is a chronic issue.   2:14 AM: Re-eval: Patient more alert on re-examination. Able to to tell me his name and that he lives in [place]. Still answers "2029" when asked what year it is. Easily follows commands and is able to complete a thorough neuro exam.   Labs and imaging reviewed. CT negative for any acute hemorrhage. CT c-spine and Thoracic XR negative for any acute fracture. XR lumbar spine concerning for possible lumbar compression fracture.   4:00 AM: Nurse reports that patient was able to stand up and bear weight, he shuffled a few steps before grabbing onto something to assist him in walking. Re-eval: patient with no neck pain. C-spine cleared. Patient stable for discharge. Plan to transfer back to nursing home.      Final Clinical Impressions(s) / ED Diagnoses   Final diagnoses:  Fall, initial encounter    New Prescriptions Discharge Medication List as of 11/20/2016  4:12 AM       Westley Foots, PA 11/20/16 1610    Derwood Kaplan, MD 11/21/16 0001

## 2016-11-20 NOTE — ED Triage Notes (Signed)
Patient from SNF for a fall that happened around 2300 11/19/16.  Facility states that he fell earlier in the day as well.  Did not hit his head but patient is on blood thinners. Eilquis.  Patient complaining of back pain and is resting comfortably.  A&Ox4 but does have baseline dementia.

## 2016-11-20 NOTE — ED Provider Notes (Signed)
Pt seen and evaluated.  Altered and agitated. Seen last pm after fall, CTs negative. Remained agitated at facility today.  Pt anticoagulated. Pt fell again at facility. Given IM haldol prehospital by EMS. Agitated here. Given geodon. Will repeat imaging here, plus UA. Labs.  Pt may need admit for further eval of alteration.   Rolland PorterMark Stashia Sia, MD 11/20/16 [phone]

## 2016-11-20 NOTE — ED Notes (Signed)
Seizure pads placed on bed rails for pt safety 

## 2016-11-20 NOTE — ED Triage Notes (Addendum)
[facility] after staff found him crawling around floor and being combative. Staff stated he fell out of bed this morning; last seen well last night. Pt has hx of falls and dementia but has not been aggressive or combative in the past. Pt is on Eliquis. VS 165/102 en route. CBG 117. Pt was combative en route, EMS gave 5mg  Haldol prior to arrival. Pt was resting upon arrival. Per EMS, pt is a DNR, but no DNR ticket was brought with the pt.

## 2016-11-21 ENCOUNTER — Encounter (HOSPITAL_COMMUNITY): Payer: Self-pay | Admitting: *Deleted

## 2016-11-21 DIAGNOSIS — Z8673 Personal history of transient ischemic attack (TIA), and cerebral infarction without residual deficits: Secondary | ICD-10-CM | POA: Diagnosis not present

## 2016-11-21 DIAGNOSIS — F0391 Unspecified dementia with behavioral disturbance: Secondary | ICD-10-CM | POA: Diagnosis present

## 2016-11-21 DIAGNOSIS — D638 Anemia in other chronic diseases classified elsewhere: Secondary | ICD-10-CM | POA: Diagnosis present

## 2016-11-21 DIAGNOSIS — R296 Repeated falls: Secondary | ICD-10-CM | POA: Diagnosis present

## 2016-11-21 DIAGNOSIS — L8990 Pressure ulcer of unspecified site, unspecified stage: Secondary | ICD-10-CM | POA: Diagnosis present

## 2016-11-21 DIAGNOSIS — I4892 Unspecified atrial flutter: Secondary | ICD-10-CM | POA: Diagnosis present

## 2016-11-21 DIAGNOSIS — E785 Hyperlipidemia, unspecified: Secondary | ICD-10-CM | POA: Diagnosis present

## 2016-11-21 DIAGNOSIS — I2510 Atherosclerotic heart disease of native coronary artery without angina pectoris: Secondary | ICD-10-CM | POA: Diagnosis present

## 2016-11-21 DIAGNOSIS — E876 Hypokalemia: Secondary | ICD-10-CM | POA: Diagnosis not present

## 2016-11-21 DIAGNOSIS — R600 Localized edema: Secondary | ICD-10-CM | POA: Diagnosis present

## 2016-11-21 DIAGNOSIS — Z885 Allergy status to narcotic agent status: Secondary | ICD-10-CM | POA: Diagnosis not present

## 2016-11-21 DIAGNOSIS — R338 Other retention of urine: Secondary | ICD-10-CM | POA: Diagnosis present

## 2016-11-21 DIAGNOSIS — N401 Enlarged prostate with lower urinary tract symptoms: Secondary | ICD-10-CM | POA: Diagnosis present

## 2016-11-21 DIAGNOSIS — Z515 Encounter for palliative care: Secondary | ICD-10-CM | POA: Diagnosis not present

## 2016-11-21 DIAGNOSIS — G40909 Epilepsy, unspecified, not intractable, without status epilepticus: Secondary | ICD-10-CM | POA: Diagnosis present

## 2016-11-21 DIAGNOSIS — Z781 Physical restraint status: Secondary | ICD-10-CM | POA: Diagnosis not present

## 2016-11-21 DIAGNOSIS — G92 Toxic encephalopathy: Secondary | ICD-10-CM | POA: Diagnosis present

## 2016-11-21 DIAGNOSIS — R001 Bradycardia, unspecified: Secondary | ICD-10-CM | POA: Diagnosis present

## 2016-11-21 DIAGNOSIS — Z66 Do not resuscitate: Secondary | ICD-10-CM | POA: Diagnosis present

## 2016-11-21 DIAGNOSIS — Z79899 Other long term (current) drug therapy: Secondary | ICD-10-CM | POA: Diagnosis not present

## 2016-11-21 DIAGNOSIS — G9340 Encephalopathy, unspecified: Secondary | ICD-10-CM | POA: Diagnosis not present

## 2016-11-21 DIAGNOSIS — I4891 Unspecified atrial fibrillation: Secondary | ICD-10-CM | POA: Diagnosis not present

## 2016-11-21 DIAGNOSIS — I480 Paroxysmal atrial fibrillation: Secondary | ICD-10-CM | POA: Diagnosis present

## 2016-11-21 DIAGNOSIS — I10 Essential (primary) hypertension: Secondary | ICD-10-CM | POA: Diagnosis present

## 2016-11-21 LAB — CULTURE, BLOOD (ROUTINE X 2)
CULTURE: NO GROWTH
Culture: NO GROWTH
Culture: NO GROWTH
Culture: NO GROWTH

## 2016-11-21 LAB — CBC
HCT: 29 % — ABNORMAL LOW (ref 39.0–52.0)
Hemoglobin: 7.1 g/dL — ABNORMAL LOW (ref 13.0–17.0)
MCH: 21.2 pg — ABNORMAL LOW (ref 26.0–34.0)
MCHC: 24.5 g/dL — ABNORMAL LOW (ref 30.0–36.0)
MCV: 86.6 fL (ref 78.0–100.0)
Platelets: 238 10*3/uL (ref 150–400)
RBC: 3.35 MIL/uL — ABNORMAL LOW (ref 4.22–5.81)
RDW: 18.5 % — AB (ref 11.5–15.5)
WBC: 7 10*3/uL (ref 4.0–10.5)

## 2016-11-21 LAB — RAPID URINE DRUG SCREEN, HOSP PERFORMED
Amphetamines: NOT DETECTED
BARBITURATES: NOT DETECTED
Benzodiazepines: NOT DETECTED
Cocaine: NOT DETECTED
Opiates: NOT DETECTED
Tetrahydrocannabinol: NOT DETECTED

## 2016-11-21 LAB — GLUCOSE, CAPILLARY: GLUCOSE-CAPILLARY: 79 mg/dL (ref 65–99)

## 2016-11-21 LAB — BASIC METABOLIC PANEL
ANION GAP: 8 (ref 5–15)
BUN: 9 mg/dL (ref 6–20)
CHLORIDE: 113 mmol/L — AB (ref 101–111)
CO2: 21 mmol/L — ABNORMAL LOW (ref 22–32)
Calcium: 8.6 mg/dL — ABNORMAL LOW (ref 8.9–10.3)
Creatinine, Ser: 1.15 mg/dL (ref 0.61–1.24)
GFR calc Af Amer: >60 mL/min (ref >60)
GFR calc non Af Amer: >60 mL/min (ref >60)
Glucose, Bld: 77 mg/dL (ref 65–99)
POTASSIUM: 4.6 mmol/L (ref 3.5–5.1)
SODIUM: 142 mmol/L (ref 135–145)

## 2016-11-21 LAB — LACTIC ACID, PLASMA: LACTIC ACID, VENOUS: 0.8 mmol/L (ref 0.5–1.9)

## 2016-11-21 MED ORDER — LORAZEPAM 1 MG PO TABS: 1.0000 mg | ORAL_TABLET | Freq: Four times a day (QID) | ORAL | Status: DC | PRN

## 2016-11-21 MED ORDER — LORAZEPAM 2 MG/ML IJ SOLN
1.0000 mg | Freq: Once | INTRAMUSCULAR | Status: AC
  Administered 2016-11-21: 1 mg via INTRAVENOUS
  Filled 2016-11-21: qty 1

## 2016-11-21 MED ORDER — LORAZEPAM 2 MG/ML IJ SOLN
1.0000 mg | Freq: Four times a day (QID) | INTRAMUSCULAR | Status: DC | PRN
  Administered 2016-11-21 – 2016-11-22 (×3): 1 mg via INTRAMUSCULAR
  Filled 2016-11-21 (×3): qty 1

## 2016-11-21 NOTE — Progress Notes (Signed)
Bladder scan revealed 426 ml urine. MD order to in and out cath. If greater than 400, foley to be placed. 500 ml urine emptied from in and out cath. Foley placed, per MD order.  Berdine DanceLauren Moffitt BSN, RN

## 2016-11-21 NOTE — Progress Notes (Signed)
Patient agitated, attempting to hit staff.

## 2016-11-21 NOTE — Progress Notes (Signed)
[facility]  Patient Status: Active (receiving services up to time of hospitalization)  [facility] is providing the following services: RN, PT and OT  If patient discharges after hours, please call [phone].   Elijah EchevariaKaren Nussbaum 11/21/2016, 10:53 AM

## 2016-11-21 NOTE — Progress Notes (Signed)
Clinical Social Worker spoke with patients guardian Levy PupaDiane McClanton. Diane stated [facility]. Diane stated she would like to speak to RN in regards to patients care. CSW made RN aware of Diane request.  CSW contacted facility [phone]) to verify patient is able to come back once medically stable. CSW spoke to [place] from facility and he stated patient would be able to return to facility. CSW remains available for support and discharge needs.  Marrianne MoodAshley Jolena Kittle, MSW,  [employer] [phone]

## 2016-11-21 NOTE — Progress Notes (Signed)
OT Cancellation Note  Patient Details Name: Morrison OldGeorge L Leyba MRN: [id number] DOB: [birth year]   Cancelled Treatment:     unable to arouse pt. Pt. Nurse and CVA state he is very lethargic today. Will try again at a later date.   Gaylynn Seiple 11/21/2016, 12:02 PM

## 2016-11-21 NOTE — Care Management Note (Signed)
Case Management Note Donn PieriniKristi Cheyene Hamric RN, BSN Unit 2W-Case Manager [phone]  Patient Details  Name: [name] MRN: [id number] Date of Birth: 27-Jul-1946  Subjective/Objective:   Pt presented with acue encephalopathy and fall- [facility]                  Action/Plan: PTA pt was apparently [facility] (CSW trying to find out which one)- [facility] for [facility]/PT/OT services- [facility]- per PT eval recommendations for SNF- CSW to follow up on d/c needs.   Expected Discharge Date:            Expected Discharge Plan:  Home w Home Health Services  In-House Referral:  Clinical Social Work  Discharge planning Services  CM Consult  Post Acute Care Choice:  Home Health, Resumption of Svcs/PTA Provider Choice offered to:  Adult Children  DME Arranged:    DME Agency:     HH Arranged:  RN, PT, OT, Nurse's Aide HH Agency:  [facility]  Status of Service:  In process, will continue to follow  If discussed at Long Length of Stay Meetings, dates discussed:    Additional Comments:  Darrold SpanWebster, Sriansh Farra Hall, RN 11/21/2016, 2:56 PM

## 2016-11-21 NOTE — Progress Notes (Signed)
Cannot complete Admission. Pharmacy waiting for SNF where patient came from.

## 2016-11-21 NOTE — Progress Notes (Signed)
1000 meds not given because pt has been lethargic all morning and is not able to safely take meds at this time. MD aware. This RN will continue to monitor and will try to administer meds at a later time.  Berdine DanceLauren Moffitt BSN, RN

## 2016-11-21 NOTE — Evaluation (Signed)
Physical Therapy Evaluation Patient Details Name: ELISE GLADDEN MRN: [id number] DOB: 1945-10-01 Today's Date: 11/21/2016   History of Present Illness  Pt is a 71 year old male admitted with encephalopathy and fall with recent D/C 3/16 with same.  PMHx significant for syncope, stroke, seizures, dementia, CAD, CABG, R tibia fracture surgery - 1960s  Clinical Impression  Pt with eyes closed throughout session and not responsive verbally. Pt with decreased cognition, strength, transfers, and function who will benefit from acute therapy to maximize mobility, gait and safety. Pt was walking with RW 16' on 3/15 with min assist for all mobility and at this time very lethargic with intentional movement to EOB but unable to maintain arousal or balance.     Follow Up Recommendations SNF;Supervision/Assistance - 24 hour    Equipment Recommendations  None recommended by PT    Recommendations for Other Services       Precautions / Restrictions Precautions Precautions: Fall      Mobility  Bed Mobility Overal bed mobility: Needs Assistance Bed Mobility: Supine to Sit;Sit to Supine     Supine to sit: Total assist Sit to supine: Min assist   General bed mobility comments: Pt initially not following commands and total assist to transition with HOB elevated from supine to sitting EOB. Returned pt to supine position with max assist and pt sat up to EOB on his own. Assisted pt min assist back to supine and pt again sat up on his own to EOB and scooted to edge. Pt unable to stand with cues and returned to supine.   Transfers                 General transfer comment: unable at this time due to cognition  Ambulation/Gait                Stairs            Wheelchair Mobility    Modified Rankin (Stroke Patients Only)       Balance Overall balance assessment: Needs assistance;History of Falls   Sitting balance-Leahy Scale: Poor   Postural control: Posterior lean                                    Pertinent Vitals/Pain Pain Assessment:  (CPOT= 0)    Home Living Family/patient expects to be discharged to:: Skilled nursing facility               Home Equipment: Walker - 2 wheels      Prior Function Level of Independence: Independent with assistive device(s)         Comments: per last admission pt was at ALF and walking with RW and at times uses WC. D/C 3/16 to SNF and returned from SNF this admission     Hand Dominance   Dominant Hand: Right    Extremity/Trunk Assessment   Upper Extremity Assessment Upper Extremity Assessment: Generalized weakness    Lower Extremity Assessment Lower Extremity Assessment: Generalized weakness RLE Deficits / Details: lower leg deformity - s/p tibial surgery, bil LE edema    Cervical / Trunk Assessment Cervical / Trunk Assessment: Kyphotic  Communication   Communication: Expressive difficulties  Cognition Arousal/Alertness: Lethargic;Suspect due to medications   Overall Cognitive Status: Difficult to assess                 General Comments: pt lethargic throughout despite volitional movement with eyes  closed the entire time. Pt did receive Haldol and Ativan early AM    General Comments      Exercises     Assessment/Plan    PT Assessment Patient needs continued PT services  PT Problem List Decreased strength;Decreased activity tolerance;Decreased balance;Decreased mobility;Decreased safety awareness;Decreased cognition       PT Treatment Interventions DME instruction;Gait training;Functional mobility training;Therapeutic exercise;Therapeutic activities;Patient/family education;Balance training;Cognitive remediation    PT Goals (Current goals can be found in the Care Plan section)  Acute Rehab PT Goals PT Goal Formulation: Patient unable to participate in goal setting Time For Goal Achievement: 12/05/16 Potential to Achieve Goals: Fair    Frequency Min  3X/week   Barriers to discharge        Co-evaluation               End of Session   Activity Tolerance: Patient limited by lethargy Patient left: in bed;with call bell/phone within reach;with bed alarm set;with nursing/sitter in room Nurse Communication: Mobility status PT Visit Diagnosis: Muscle weakness (generalized) (M62.81);History of falling (Z91.81)    Functional Assessment Tool Used: AM-PAC 6 Clicks Basic Mobility Mobility: Walking and Moving Around Current Status ([id number](G8978): At least 60 percent but less than 80 percent impaired, limited or restricted Mobility: Walking and Moving Around Goal Status [phone](G8979): At least 20 percent but less than 40 percent impaired, limited or restricted    Time: 1121-1136 PT Time Calculation (min) (ACUTE ONLY): 15 min   Charges:   PT Evaluation $PT Eval Moderate Complexity: 1 Procedure     PT G Codes:   PT G-Codes **NOT FOR INPATIENT CLASS** Functional Assessment Tool Used: AM-PAC 6 Clicks Basic Mobility Mobility: Walking and Moving Around Current Status ([id number](G8978): At least 60 percent but less than 80 percent impaired, limited or restricted Mobility: Walking and Moving Around Goal Status [phone](G8979): At least 20 percent but less than 40 percent impaired, limited or restricted     Zamari Bonsall B Dracen Reigle 11/21/2016, 12:01 PM  Delaney MeigsMaija Tabor Tiya Schrupp, PT [phone]

## 2016-11-21 NOTE — Progress Notes (Signed)
PROGRESS NOTE    AUGUSTO DECKMAN  ZOX:[id number] DOB: Oct 13, 1945 DOA: 11/20/2016 PCP: [facility]     Brief Narrative:  REUVEN BRAVER is a 71 y.o. male with medical history significant of A Fib, dementia, multiple falls, seizures; he presents after a fall and also encephalopathy. Patient had recently been discharged from the hospital on 3/14. He was admitted for toxic metabolic encephalopathy of unclear etiology. At that time, he was recommended to be discharged to skilled nursing facility, however, family had declined and took him back to assisted living facility with home health services. Early morning on 3/18, patient was found on the floor after an unwitnessed fall. He was sent to the emergency department where workup was largely unremarkable. He was sent back to assisted living home. He then presented back to the emergency department on the afternoon of 3/18 after patient was found crawling on the floor, being combative. He does have history of multiple falls and dementia, but had not been aggressive or combative in the past. He was given Haldol in the EMS, and Geodon in the emergency department and became bradycardic.   Assessment & Plan:   Principal Problem:   Acute encephalopathy Active Problems:   BPH (benign prostatic hyperplasia)   Seizure disorder (HCC)   Dementia with behavioral disturbance   Atrial fibrillation and flutter (HCC)   Dyslipidemia  Acute encephalopathy, unclear etiology  -With hx of dementia  -Patient was recently admitted and discharged on 3/14 due to toxic metabolic encephalopathy of unclear etiology, most likely due to polypharmacy. Patient had been on multiple medications at the time which were changed at time of discharge.  -Urinalysis negative for infectious process  -CT head: no acute intracranial findings. -Neuro checks  -Check UDS   Paroxysmal atrial fibrillation, with bradycardia  -Chadsvasc greater than 3. Had been on Eliquis in  the past. At last admission, risks and benefits were discussed. Due to multiple falls, anticoagulation was deemed too high risk. Continue aspirin -After admission was called, patient became bradycardic in the ED. When patient is aroused, HR does go up into the high 50s, then returns to 30-40s. Per sister-in-law, patient was admitted to [facility] in late December 2017 due to A fib and low HR. At the time, pacemaker was discussed but family decided not to pursue it. He eventually became stable and was discharged at that time. Obtain records from [place]  -?Could be secondary to drug interaction, as his bradycardia started after haldol and geodon administration  -Now resolved, normal sinus rhythm on tele today   Bilateral peripheral edema -BNP 601.1 -Echo pending   Multiple falls -Last admission, patient and family had refused SNF placement and was discharged to ALF with [facility] services  -CT head and cervical spine without acute injury  -Lumbar xray with possible L2 acute compression fracture. Unable to adequately assess for pain currently  -PT OT to evaluate   Chronic normocytic anemia -Work up consistent with anemia chronic disease   Seizures -Continue Lamictal  HLD -Pravachol   BPH -Continue finasteride    DVT prophylaxis: lovenox Code Status: DNR Family Communication: spoke with sister in law 3/18 Disposition Plan: pending stabilization, ALF vs SNF    Consultants:   None  Procedures:   None  Antimicrobials:   None     Subjective: Patient lethargic this morning. Arouses and sits up in bed, then falls back asleep. Does not awaken to answer questions or participate in exam. Overnight, became agitated attempting to  hit staff per RN note.    Objective: Vitals:   11/20/16 1820 11/20/16 2017 11/21/16 0453 11/21/16 0459  BP: (!) 150/112 125/66 (!) 125/102   Pulse: 61 (!) 45 (!) 122   Resp: 17 20 20    Temp:  97.1 F (36.2 C) 97.8 F (36.6 C)   TempSrc:   Axillary Axillary   SpO2: 100% 99% 90%   Weight:    72.3 kg (159 lb 6.4 oz)  Height:    5\' 10"  (1.778 m)   No intake or output data in the 24 hours ending 11/21/16 0953 Filed Weights   11/21/16 0459  Weight: 72.3 kg (159 lb 6.4 oz)    Examination:  General exam: somnolent, does not awaken to voice, but does stir with physical stimuli and sits up in bed spontaneously but falls asleep easily  Respiratory system: Clear to auscultation. Respiratory effort normal. Cardiovascular system: S1 & S2 heard, RRR. No JVD, murmurs, rubs, gallops or clicks. + pedal edema. Gastrointestinal system: Abdomen is nondistended, soft and nontender. No organomegaly or masses felt. Normal bowel sounds heard. Central nervous system: unable to test as patient somnolent on exam  Extremities: Symmetric  Skin: No rashes, lesions or ulcers Psychiatry: unable to test as patient somnolent on exam   Data Reviewed: I have personally reviewed following labs and imaging studies  CBC:  Recent Labs Lab 11/16/16 1216 11/16/16 2334 11/20/16 0051 11/20/16 1421 11/21/16 0235  WBC 6.1 6.7 7.7 5.9 7.0  NEUTROABS  --  4.9  --  4.3  --   HGB 8.6* 9.5* 10.3* 9.3* 7.1*  HCT 27.1* 30.7* 32.7* 30.1* 29.0*  MCV 82.9 82.1 84.1 83.6 86.6  PLT 266 266 311 242 238   Basic Metabolic Panel:  Recent Labs Lab 11/17/16 0556 11/18/16 0533 11/20/16 0051 11/20/16 1421 11/20/16 1816 11/21/16 0235  NA 142 143 138 141  --  142  K 2.9* 3.8 4.1 3.7  --  4.6  CL 109 112* 106 108  --  113*  CO2 25 22 18* 20*  --  21*  GLUCOSE 94 88 97 95  --  77  BUN 10 9 10 11   --  9  CREATININE 0.99 1.13  1.10 1.06 1.17  --  1.15  CALCIUM 8.6* 8.7* 8.8* 8.9  --  8.6*  MG  --   --   --   --  1.9  --   PHOS  --   --   --   --  2.8  --    GFR: Estimated Creatinine Clearance: 58.5 mL/min (by C-G formula based on SCr of 1.15 mg/dL). Liver Function Tests:  Recent Labs Lab 11/16/16 1216 11/17/16 0556 11/20/16 1618  AST 25 25 44*  ALT 12*  10* 19  ALKPHOS 87 77 76  BILITOT 1.6* 1.6* 1.6*  PROT 8.2* 7.2 7.0  ALBUMIN 3.9 3.4* 3.4*   No results for input(s): LIPASE, AMYLASE in the last 168 hours.  Recent Labs Lab 11/20/16 1421  AMMONIA 49*   Coagulation Profile:  Recent Labs Lab 11/20/16 0051  INR 2.20   Cardiac Enzymes:  Recent Labs Lab 11/20/16 1421  CKTOTAL 435*   BNP (last 3 results) No results for input(s): PROBNP in the last 8760 hours. HbA1C: No results for input(s): HGBA1C in the last 72 hours. CBG:  Recent Labs Lab 11/20/16 2055  GLUCAP 64*   Lipid Profile: No results for input(s): CHOL, HDL, LDLCALC, TRIG, CHOLHDL, LDLDIRECT in the last 72 hours. Thyroid Function  Tests:  Recent Labs  11/20/16 1816  TSH 4.465  FREET4 1.22*   Anemia Panel: No results for input(s): VITAMINB12, FOLATE, FERRITIN, TIBC, IRON, RETICCTPCT in the last 72 hours. Sepsis Labs:  Recent Labs Lab 11/20/16 1427  LATICACIDVEN 2.52*    Recent Results (from the past 240 hour(s))  Blood culture (routine x 2)     Status: None (Preliminary result)   Collection Time: 11/16/16  3:56 PM  Result Value Ref Range Status   Specimen Description BLOOD BLOOD LEFT FOREARM  Final   Special Requests BOTTLES DRAWN AEROBIC AND ANAEROBIC 5 CC EA  Final   Culture   Final    NO GROWTH 4 DAYS Performed at [facility], [address] [place], [place] [address]    Report Status PENDING  Incomplete  Blood culture (routine x 2)     Status: None (Preliminary result)   Collection Time: 11/16/16  4:24 PM  Result Value Ref Range Status   Specimen Description BLOOD RIGHT ANTECUBITAL  Final   Special Requests BOTTLES DRAWN AEROBIC AND ANAEROBIC 5CC  Final   Culture   Final    NO GROWTH 4 DAYS Performed at [facility], [address] [place], [place] [address]    Report Status PENDING  Incomplete  Culture, blood (routine x 2) Call MD if unable to obtain prior to antibiotics being given     Status: None (Preliminary  result)   Collection Time: 11/16/16  8:20 PM  Result Value Ref Range Status   Specimen Description BLOOD RIGHT ARM  Final   Special Requests BOTTLES DRAWN AEROBIC ONLY 5CC  Final   Culture   Final    NO GROWTH 4 DAYS Performed at [facility], [address] [place], [place] [address]    Report Status PENDING  Incomplete  Culture, blood (routine x 2) Call MD if unable to obtain prior to antibiotics being given     Status: None (Preliminary result)   Collection Time: 11/16/16  8:20 PM  Result Value Ref Range Status   Specimen Description BLOOD RIGHT HAND  Final   Special Requests IN PEDIATRIC BOTTLE 5CC  Final   Culture   Final    NO GROWTH 4 DAYS Performed at [facility], [address] [place], [place] [address]    Report Status PENDING  Incomplete  MRSA PCR Screening     Status: None   Collection Time: 11/17/16 12:40 AM  Result Value Ref Range Status   MRSA by PCR NEGATIVE NEGATIVE Final    Comment:        The GeneXpert MRSA Assay (FDA approved for NASAL specimens only), is one component of a comprehensive MRSA colonization surveillance program. It is not intended to diagnose MRSA infection nor to guide or monitor treatment for MRSA infections.        Radiology Studies: Dg Thoracic Spine 2 View  Result Date: 11/20/2016 CLINICAL DATA:  Fall EXAM: THORACIC SPINE 2 VIEWS COMPARISON:  Chest CT 11/16/2016 FINDINGS: There is no evidence of thoracic spine fracture. Alignment is normal. Multilevel chronic vertebral body height loss. IMPRESSION: No acute abnormality. Electronically Signed   By: Deatra Robinson M.D.   On: 11/20/2016 02:48   Dg Lumbar Spine 2-3 Views  Result Date: 11/20/2016 CLINICAL DATA:  Back pain after a fall. EXAM: LUMBAR SPINE - 2-3 VIEW COMPARISON:  CT chest abdomen and pelvis 11/16/2006 FINDINGS: Normal alignment of the lumbar spine. Ballooning of interspaces with endplate  depression deformities at T12-L1, L1-2, L2-3, L3-4, and L4-5 levels,  likely representing changes due to osteoporosis. There is slight anterior cortical irregularity of the superior endplate of L2 could represent acute fracture. No focal bone lesion or bone destruction. Visualized sacrum appears intact. Vascular calcifications. Postoperative changes in the right hip. IMPRESSION: Multiple endplate compression deformities with ballooning of interspaces likely representing osteoporotic change. Slight anterior cortical irregularity of the superior endplate of L2 could represent acute compression fracture. No displacement or retropulsion of fracture fragments. Electronically Signed   By: Burman NievesWilliam  Stevens M.D.   On: 11/20/2016 02:51   Dg Wrist Complete Left  Result Date: 11/20/2016 CLINICAL DATA:  Left arm pain after a fall. Back pain. History of dementia. EXAM: LEFT WRIST - COMPLETE 3+ VIEW COMPARISON:  None. FINDINGS: Compression deformity of the trapezium is associated with degenerative change, probably representing chronic process rather than acute fracture. No definite evidence of any acute fracture or dislocation in the left wrist. Soft tissues are unremarkable. IMPRESSION: Compression deformity of the trapezium with associated degenerative changes probably representing old deformity. No definite evidence of acute fracture. Electronically Signed   By: Burman NievesWilliam  Stevens M.D.   On: 11/20/2016 02:48   Ct Head Wo Contrast  Result Date: 11/20/2016 CLINICAL DATA:  Post fall. Altered mental status and combative since found. Dementia. EXAM: CT HEAD WITHOUT CONTRAST CT CERVICAL SPINE WITHOUT CONTRAST TECHNIQUE: Multidetector CT imaging of the head and cervical spine was performed following the standard protocol without intravenous contrast. Multiplanar CT image reconstructions of the cervical spine were also generated. COMPARISON:  11/20/2016 at 1:13 a.m. and 08/29/2016 FINDINGS: CT HEAD FINDINGS Brain: Ventricles and cisterns are unchanged. CSF spaces are within normal. There is  evidence of an old right sided infarct involving the temporal and posterior parietal region. Associated dilatation of the atria and temporal horn of the right lateral ventricle. Mild chronic ischemic microvascular disease. Old left cerebellar infarct. No mass, mass effect, shift of midline structures or acute hemorrhage. No evidence of acute infarction. Vascular: Calcified plaque over the cavernous segment of the internal carotid arteries. Skull: Within normal. Sinuses/Orbits: Within normal. Other: None. CT CERVICAL SPINE FINDINGS Alignment: Within normal. The atlantoaxial articulation is within normal. Skull base and vertebrae: Moderate spondylosis throughout the cervical spine with prominent anterior osteophytes. Mild uncovertebral joint spurring. No acute fracture. Soft tissues and spinal canal: Spinal canal is within normal. Prevertebral soft tissues are normal. Disc levels:  Within normal. Upper chest: Within normal. Other: Moderate calcified plaque over the carotid bifurcations. IMPRESSION: No acute intracranial findings. 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 No acute cervical spine injury. Moderate spondylosis throughout the cervical spine. Electronically Signed   By: Elberta Fortisaniel  Boyle M.D.   On: 11/20/2016 15:47   Ct Head Wo Contrast  Result Date: 11/20/2016 CLINICAL DATA:  Larey SeatFell tonight twice. No known head injury. Patient is on blood thinners. History of dementia. EXAM: CT HEAD WITHOUT CONTRAST CT CERVICAL SPINE WITHOUT CONTRAST TECHNIQUE: Multidetector CT imaging of the head and cervical spine was performed following the standard protocol without intravenous contrast. Multiplanar CT image reconstructions of the cervical spine were also generated. COMPARISON:  08/29/2016 FINDINGS: CT HEAD FINDINGS Brain: Old area of encephalomalacia involving the right temporal and parietal lobes consistent with old infarct in the distribution of the right  middle cerebral artery. There is associated dilatation of the temporal and posterior horns of the right lateral ventricle due to volume loss. Old left inferior cerebellar infarct.  Diffuse cerebral atrophy. Ventricular dilatation consistent with central atrophy. Low-attenuation changes in the deep white matter consistent with small vessel ischemia. No mass effect or midline shift. No abnormal extra-axial fluid collections. Gray-white matter junctions are distinct. Basal cisterns are not effaced. No acute intracranial hemorrhage. Vascular: Vascular calcifications are present. Skull: Calvarium appears intact. Sinuses/Orbits: Paranasal sinuses and mastoid air cells are clear. Other: No significant changes since previous study. CT CERVICAL SPINE FINDINGS Alignment: Normal alignment of the cervical vertebrae and facet joints. C1-2 articulation appears intact. Ligamentous and soft tissue calcification at the C1-2 level consistent with degenerative change. Skull base and vertebrae: No vertebral compression deformities. No focal bone lesion or bone destruction. Soft tissues and spinal canal: No prevertebral fluid or swelling. No visible canal hematoma. Disc levels: Diffuse degenerative change throughout the cervical spine with narrowed interspaces and endplate hypertrophic changes. Prominent bridging osteophytes from C3 through C7. Degenerative changes throughout the facet joints. Upper chest: Negative. Other: None. IMPRESSION: No acute intracranial abnormalities. Diffuse chronic small vessel ischemia and atrophy. Old infarcts in the right temporal parietal and left inferior cerebellar regions. Normal alignment of the cervical spine. Diffuse degenerative change throughout. No acute displaced fractures identified. Electronically Signed   By: Burman Nieves M.D.   On: 11/20/2016 02:38   Ct Cervical Spine Wo Contrast  Result Date: 11/20/2016 CLINICAL DATA:  Post fall. Altered mental status and combative since found.  Dementia. EXAM: CT HEAD WITHOUT CONTRAST CT CERVICAL SPINE WITHOUT CONTRAST TECHNIQUE: Multidetector CT imaging of the head and cervical spine was performed following the standard protocol without intravenous contrast. Multiplanar CT image reconstructions of the cervical spine were also generated. COMPARISON:  11/20/2016 at 1:13 a.m. and 08/29/2016 FINDINGS: CT HEAD FINDINGS Brain: Ventricles and cisterns are unchanged. CSF spaces are within normal. There is evidence of an old right sided infarct involving the temporal and posterior parietal region. Associated dilatation of the atria and temporal horn of the right lateral ventricle. Mild chronic ischemic microvascular disease. Old left cerebellar infarct. No mass, mass effect, shift of midline structures or acute hemorrhage. No evidence of acute infarction. Vascular: Calcified plaque over the cavernous segment of the internal carotid arteries. Skull: Within normal. Sinuses/Orbits: Within normal. Other: None. CT CERVICAL SPINE FINDINGS Alignment: Within normal. The atlantoaxial articulation is within normal. Skull base and vertebrae: Moderate spondylosis throughout the cervical spine with prominent anterior osteophytes. Mild uncovertebral joint spurring. No acute fracture. Soft tissues and spinal canal: Spinal canal is within normal. Prevertebral soft tissues are normal. Disc levels:  Within normal. Upper chest: Within normal. Other: Moderate calcified plaque over the carotid bifurcations. IMPRESSION: No acute intracranial findings. 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 No acute cervical spine injury. Moderate spondylosis throughout the cervical spine. Electronically Signed   By: Elberta Fortis M.D.   On: 11/20/2016 15:47   Ct Cervical Spine Wo Contrast  Result Date: 11/20/2016 CLINICAL DATA:  Larey Seat tonight twice. No known head injury. Patient is on blood thinners. History of dementia. EXAM: CT HEAD  WITHOUT CONTRAST CT CERVICAL SPINE WITHOUT CONTRAST TECHNIQUE: Multidetector CT imaging of the head and cervical spine was performed following the standard protocol without intravenous contrast. Multiplanar CT image reconstructions of the cervical spine were also generated. COMPARISON:  08/29/2016 FINDINGS: CT HEAD FINDINGS Brain: Old area of encephalomalacia involving the right temporal and parietal lobes consistent with old infarct in the distribution of the right middle cerebral artery. There is associated dilatation of the temporal and posterior  horns of the right lateral ventricle due to volume loss. Old left inferior cerebellar infarct. Diffuse cerebral atrophy. Ventricular dilatation consistent with central atrophy. Low-attenuation changes in the deep white matter consistent with small vessel ischemia. No mass effect or midline shift. No abnormal extra-axial fluid collections. Gray-white matter junctions are distinct. Basal cisterns are not effaced. No acute intracranial hemorrhage. Vascular: Vascular calcifications are present. Skull: Calvarium appears intact. Sinuses/Orbits: Paranasal sinuses and mastoid air cells are clear. Other: No significant changes since previous study. CT CERVICAL SPINE FINDINGS Alignment: Normal alignment of the cervical vertebrae and facet joints. C1-2 articulation appears intact. Ligamentous and soft tissue calcification at the C1-2 level consistent with degenerative change. Skull base and vertebrae: No vertebral compression deformities. No focal bone lesion or bone destruction. Soft tissues and spinal canal: No prevertebral fluid or swelling. No visible canal hematoma. Disc levels: Diffuse degenerative change throughout the cervical spine with narrowed interspaces and endplate hypertrophic changes. Prominent bridging osteophytes from C3 through C7. Degenerative changes throughout the facet joints. Upper chest: Negative. Other: None. IMPRESSION: No acute intracranial  abnormalities. Diffuse chronic small vessel ischemia and atrophy. Old infarcts in the right temporal parietal and left inferior cerebellar regions. Normal alignment of the cervical spine. Diffuse degenerative change throughout. No acute displaced fractures identified. Electronically Signed   By: Burman Nieves M.D.   On: 11/20/2016 02:38      Scheduled Meds: . aspirin EC  81 mg Oral Daily  . brimonidine  1 drop Both Eyes BID  . brinzolamide  1 drop Both Eyes BID  . enoxaparin (LOVENOX) injection  40 mg Subcutaneous Q24H  . finasteride  5 mg Oral Daily  . lamoTRIgine  200 mg Oral BID  . latanoprost  1 drop Both Eyes QHS  . polyvinyl alcohol  1 drop Both Eyes 5 X Daily  . senna-docusate  2 tablet Oral Daily  . sodium chloride flush  3 mL Intravenous Q12H  . timolol  1 drop Both Eyes BID   Continuous Infusions:   LOS: 0 days    Time spent: 30 minutes   Noralee Stain, DO [facility] [email or web address] Password [facility] 11/21/2016, 9:53 AM

## 2016-11-22 ENCOUNTER — Inpatient Hospital Stay (HOSPITAL_COMMUNITY): Payer: Medicare Other

## 2016-11-22 DIAGNOSIS — I4891 Unspecified atrial fibrillation: Secondary | ICD-10-CM

## 2016-11-22 LAB — BASIC METABOLIC PANEL
Anion gap: 12 (ref 5–15)
BUN: 8 mg/dL (ref 6–20)
CHLORIDE: 108 mmol/L (ref 101–111)
CO2: 21 mmol/L — ABNORMAL LOW (ref 22–32)
CREATININE: 1.18 mg/dL (ref 0.61–1.24)
Calcium: 8.8 mg/dL — ABNORMAL LOW (ref 8.9–10.3)
GFR calc Af Amer: >60 mL/min (ref >60)
GFR calc non Af Amer: 59 mL/min — ABNORMAL LOW (ref >60)
Glucose, Bld: 74 mg/dL (ref 65–99)
Potassium: 3.8 mmol/L (ref 3.5–5.1)
SODIUM: 141 mmol/L (ref 135–145)

## 2016-11-22 LAB — CBC
HCT: 28.4 % — ABNORMAL LOW (ref 39.0–52.0)
HEMOGLOBIN: 8.6 g/dL — AB (ref 13.0–17.0)
MCH: 25.7 pg — AB (ref 26.0–34.0)
MCHC: 30.3 g/dL (ref 30.0–36.0)
MCV: 84.8 fL (ref 78.0–100.0)
PLATELETS: 230 10*3/uL (ref 150–400)
RBC: 3.35 MIL/uL — ABNORMAL LOW (ref 4.22–5.81)
RDW: 18.3 % — ABNORMAL HIGH (ref 11.5–15.5)
WBC: 5.9 10*3/uL (ref 4.0–10.5)

## 2016-11-22 LAB — ECHOCARDIOGRAM COMPLETE
Height: 70 in
WEIGHTICAEL: 2550.4 [oz_av]

## 2016-11-22 MED ORDER — ENOXAPARIN SODIUM 40 MG/0.4ML ~~LOC~~ SOLN
40.0000 mg | Freq: Every day | SUBCUTANEOUS | Status: DC
  Administered 2016-11-22 – 2016-11-28 (×7): 40 mg via SUBCUTANEOUS
  Filled 2016-11-22 (×6): qty 0.4

## 2016-11-22 MED ORDER — LORAZEPAM 2 MG/ML IJ SOLN
1.0000 mg | INTRAMUSCULAR | Status: DC | PRN
  Administered 2016-11-22 – 2016-11-23 (×3): 1 mg via INTRAMUSCULAR
  Filled 2016-11-22 (×3): qty 1

## 2016-11-22 MED ORDER — LORAZEPAM 1 MG PO TABS
1.0000 mg | ORAL_TABLET | ORAL | Status: DC | PRN
  Administered 2016-11-24 – 2016-11-25 (×3): 1 mg via ORAL
  Filled 2016-11-22 (×3): qty 1

## 2016-11-22 NOTE — Progress Notes (Signed)
OT Cancellation Note  Patient Details Name: [name] MRN: [id number] DOB: June 12, 1946   Cancelled Treatment:    Reason Eval/Treat Not Completed: Patient at procedure or test/ unavailable (with echo.) will return later to assess if able.   [facility]agewest Health CareWARD,HILLARY  Ezri Fanguy, OT/L  [phone] 11/22/2016 11/22/2016, 1:20 PM

## 2016-11-22 NOTE — Progress Notes (Signed)
  Echocardiogram 2D Echocardiogram has been performed.  Arvil ChacoFoster, Carlesha Seiple 11/22/2016, 1:03 PM

## 2016-11-22 NOTE — Progress Notes (Addendum)
PROGRESS NOTE    [name]  WUJ:[id number] DOB: 1946/01/21 DOA: 11/20/2016 PCP: [facility]     Brief Narrative:  [name] is a 71 y.o. male with medical history significant of A Fib, dementia, multiple falls, seizures; he presents after a fall and also encephalopathy. Patient had recently been discharged from the hospital on 3/14. He was admitted for toxic metabolic encephalopathy of unclear etiology. At that time, he was recommended to be discharged to skilled nursing facility, however, family had declined and took him back to assisted living facility with home health services. Early morning on 3/18, patient was found on the floor after an unwitnessed fall. He was sent to the emergency department where workup was largely unremarkable. He was sent back to assisted living home. He then presented back to the emergency department on the afternoon of 3/18 after patient was found crawling on the floor, being combative. He does have history of multiple falls and dementia, but had not been aggressive or combative in the past. He was given Haldol in the EMS, and Geodon in the emergency department and became bradycardic. Bradycardia has now resolved. Patient continues to be encephalopathic, although improved since admission.  Assessment & Plan:   Principal Problem:   Acute encephalopathy Active Problems:   BPH (benign prostatic hyperplasia)   Seizure disorder (HCC)   Dementia with behavioral disturbance   Atrial fibrillation and flutter (HCC)   Dyslipidemia  Acute encephalopathy, unclear etiology  -With hx of dementia  -Patient was recently admitted and discharged on 3/14 due to toxic metabolic encephalopathy of unclear etiology, most likely due to polypharmacy. Patient had been on multiple medications at the time which were changed at time of discharge.  -Urinalysis negative for infectious process  -UDS negative  -CT head: no acute intracranial findings. -Neuro checks    -Obtain MRI brain, EEG -Has required soft bilateral wrist restraints since yesterday when he became physically abusive and combative with staff. Calm during my exam this morning.   Paroxysmal atrial fibrillation, with bradycardia  -Chadsvasc greater than 3. Had been on Eliquis in the past. At last admission, risks and benefits were discussed. Due to multiple falls, anticoagulation was deemed too high risk. Continue aspirin -After admission was called, patient became bradycardic in the ED. When patient is aroused, HR does go up into the high 50s, then returns to 30-40s. Per sister-in-law, patient was admitted to [facility] in late December 2017 due to A fib and low HR. At the time, pacemaker was discussed but family decided not to pursue it. He eventually became stable and was discharged at that time. Obtain records from [facility]  -Could be secondary to drug interaction, as his bradycardia started after haldol and geodon administration  -Now resolved, normal sinus rhythm on tele today   Bilateral peripheral edema -BNP 601.1 -Echo pending   Multiple falls -Last admission, patient and family had refused SNF placement and was discharged to ALF with [facility] services  -CT head and cervical spine without acute injury  -Lumbar xray with possible L2 acute compression fracture. Unable to adequately assess for pain currently  -PT OT to evaluate when patient can cooperate   Chronic normocytic anemia -Work up consistent with anemia chronic disease   Seizures -Continue Lamictal  HLD -Pravachol   BPH -Continue finasteride    DVT prophylaxis: lovenox Code Status: DNR Family Communication: spoke with sister in law 3/20 Disposition Plan: pending stabilization, ALF vs SNF    Consultants:  None  Procedures:   None  Antimicrobials:   None     Subjective: Patient more alert this morning, although lethargic. He is able to tell me that he is in the hospital, but does not  answer other questions comprehensibly.    Objective: Vitals:   11/21/16 1314 11/21/16 2126 11/22/16 0414 11/22/16 0428  BP: (!) 171/54 (!) 148/81 (!) 183/103 (!) 146/64  Pulse: (!) 54 73 (!) 141 75  Resp: 19 18 (!) 22   Temp: 98.4 F (36.9 C) 97.9 F (36.6 C) 98.7 F (37.1 C)   TempSrc: Axillary Axillary Axillary   SpO2: 99% 99% 90%   Weight:      Height:        Intake/Output Summary (Last 24 hours) at 11/22/16 1041 Last data filed at 11/21/16 2128  Gross per 24 hour  Intake              120 ml  Output              750 ml  Net             -630 ml   Filed Weights   11/21/16 0459  Weight: 72.3 kg (159 lb 6.4 oz)    Examination:  General exam: lethargic, awakes to voice  Respiratory system: Clear to auscultation. Respiratory effort normal. Cardiovascular system: S1 & S2 heard, RRR. No JVD, murmurs, rubs, gallops or clicks. + pedal edema. Gastrointestinal system: Abdomen is nondistended, soft and nontender. No organomegaly or masses felt. Normal bowel sounds heard. Central nervous system: does not follow commands, but able to tell me that he is in the hospital  Extremities: Symmetric  Skin: No rashes, lesions or ulcers Psychiatry: unable to test due to lack of patient cooperation   Data Reviewed: I have personally reviewed following labs and imaging studies  CBC:  Recent Labs Lab 11/16/16 2334 11/20/16 0051 11/20/16 1421 11/21/16 0235 11/22/16 0350  WBC 6.7 7.7 5.9 7.0 5.9  NEUTROABS 4.9  --  4.3  --   --   HGB 9.5* 10.3* 9.3* 7.1* 8.6*  HCT 30.7* 32.7* 30.1* 29.0* 28.4*  MCV 82.1 84.1 83.6 86.6 84.8  PLT 266 311 242 238 230   Basic Metabolic Panel:  Recent Labs Lab 11/18/16 0533 11/20/16 0051 11/20/16 1421 11/20/16 1816 11/21/16 0235 11/22/16 0350  NA 143 138 141  --  142 141  K 3.8 4.1 3.7  --  4.6 3.8  CL 112* 106 108  --  113* 108  CO2 22 18* 20*  --  21* 21*  GLUCOSE 88 97 95  --  77 74  BUN 9 10 11   --  9 8  CREATININE 1.13  1.10 1.06 1.17   --  1.15 1.18  CALCIUM 8.7* 8.8* 8.9  --  8.6* 8.8*  MG  --   --   --  1.9  --   --   PHOS  --   --   --  2.8  --   --    GFR: Estimated Creatinine Clearance: 57 mL/min (by C-G formula based on SCr of 1.18 mg/dL). Liver Function Tests:  Recent Labs Lab 11/16/16 1216 11/17/16 0556 11/20/16 1618  AST 25 25 44*  ALT 12* 10* 19  ALKPHOS 87 77 76  BILITOT 1.6* 1.6* 1.6*  PROT 8.2* 7.2 7.0  ALBUMIN 3.9 3.4* 3.4*   No results for input(s): LIPASE, AMYLASE in the last 168 hours.  Recent Labs Lab 11/20/16 1421  AMMONIA  49*   Coagulation Profile:  Recent Labs Lab 11/20/16 0051  INR 2.20   Cardiac Enzymes:  Recent Labs Lab 11/20/16 1421  CKTOTAL 435*   BNP (last 3 results) No results for input(s): PROBNP in the last 8760 hours. HbA1C: No results for input(s): HGBA1C in the last 72 hours. CBG:  Recent Labs Lab 11/20/16 2055 11/21/16 1114  GLUCAP 64* 79   Lipid Profile: No results for input(s): CHOL, HDL, LDLCALC, TRIG, CHOLHDL, LDLDIRECT in the last 72 hours. Thyroid Function Tests:  Recent Labs  11/20/16 1816  TSH 4.465  FREET4 1.22*   Anemia Panel: No results for input(s): VITAMINB12, FOLATE, FERRITIN, TIBC, IRON, RETICCTPCT in the last 72 hours. Sepsis Labs:  Recent Labs Lab 11/20/16 1427 11/21/16 1044  LATICACIDVEN 2.52* 0.8    Recent Results (from the past 240 hour(s))  Blood culture (routine x 2)     Status: None   Collection Time: 11/16/16  3:56 PM  Result Value Ref Range Status   Specimen Description BLOOD BLOOD LEFT FOREARM  Final   Special Requests BOTTLES DRAWN AEROBIC AND ANAEROBIC 5 CC EA  Final   Culture   Final    NO GROWTH 5 DAYS Performed at [facility], [address] [place], [place] [address]    Report Status 11/21/2016 FINAL  Final  Blood culture (routine x 2)     Status: None   Collection Time: 11/16/16  4:24 PM  Result Value Ref Range Status   Specimen Description BLOOD RIGHT ANTECUBITAL  Final   Special  Requests BOTTLES DRAWN AEROBIC AND ANAEROBIC 5CC  Final   Culture   Final    NO GROWTH 5 DAYS Performed at [facility], [address] [place], [place] [address]    Report Status 11/21/2016 FINAL  Final  Culture, blood (routine x 2) Call MD if unable to obtain prior to antibiotics being given     Status: None   Collection Time: 11/16/16  8:20 PM  Result Value Ref Range Status   Specimen Description BLOOD RIGHT ARM  Final   Special Requests BOTTLES DRAWN AEROBIC ONLY 5CC  Final   Culture   Final    NO GROWTH 5 DAYS Performed at [facility], [address] [place], [place] [address]    Report Status 11/21/2016 FINAL  Final  Culture, blood (routine x 2) Call MD if unable to obtain prior to antibiotics being given     Status: None   Collection Time: 11/16/16  8:20 PM  Result Value Ref Range Status   Specimen Description BLOOD RIGHT HAND  Final   Special Requests IN PEDIATRIC BOTTLE 5CC  Final   Culture   Final    NO GROWTH 5 DAYS Performed at [facility], [address] [place], [place] [address]    Report Status 11/21/2016 FINAL  Final  MRSA PCR Screening     Status: None   Collection Time: 11/17/16 12:40 AM  Result Value Ref Range Status   MRSA by PCR NEGATIVE NEGATIVE Final    Comment:        The GeneXpert MRSA Assay (FDA approved for NASAL specimens only), is one component of a comprehensive MRSA colonization surveillance program. It is not intended to diagnose MRSA infection nor to guide or monitor treatment for MRSA infections.        Radiology Studies: Ct Head Wo Contrast  Result Date: 11/20/2016 CLINICAL DATA:  Post fall. Altered mental status and  combative since found. Dementia. EXAM: CT HEAD WITHOUT CONTRAST CT CERVICAL SPINE WITHOUT CONTRAST TECHNIQUE: Multidetector CT imaging of the head and cervical spine was performed following the standard protocol without intravenous contrast. Multiplanar CT image reconstructions of the cervical  spine were also generated. COMPARISON:  11/20/2016 at 1:13 a.m. and 08/29/2016 FINDINGS: CT HEAD FINDINGS Brain: Ventricles and cisterns are unchanged. CSF spaces are within normal. There is evidence of an old right sided infarct involving the temporal and posterior parietal region. Associated dilatation of the atria and temporal horn of the right lateral ventricle. Mild chronic ischemic microvascular disease. Old left cerebellar infarct. No mass, mass effect, shift of midline structures or acute hemorrhage. No evidence of acute infarction. Vascular: Calcified plaque over the cavernous segment of the internal carotid arteries. Skull: Within normal. Sinuses/Orbits: Within normal. Other: None. CT CERVICAL SPINE FINDINGS Alignment: Within normal. The atlantoaxial articulation is within normal. Skull base and vertebrae: Moderate spondylosis throughout the cervical spine with prominent anterior osteophytes. Mild uncovertebral joint spurring. No acute fracture. Soft tissues and spinal canal: Spinal canal is within normal. Prevertebral soft tissues are normal. Disc levels:  Within normal. Upper chest: Within normal. Other: Moderate calcified plaque over the carotid bifurcations. IMPRESSION: No acute intracranial findings. 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 No acute cervical spine injury. Moderate spondylosis throughout the cervical spine. Electronically Signed   By: Elberta Fortis M.D.   On: 11/20/2016 15:47   Ct Cervical Spine Wo Contrast  Result Date: 11/20/2016 CLINICAL DATA:  Post fall. Altered mental status and combative since found. Dementia. EXAM: CT HEAD WITHOUT CONTRAST CT CERVICAL SPINE WITHOUT CONTRAST TECHNIQUE: Multidetector CT imaging of the head and cervical spine was performed following the standard protocol without intravenous contrast. Multiplanar CT image reconstructions of the cervical spine were also generated. COMPARISON:  11/20/2016  at 1:13 a.m. and 08/29/2016 FINDINGS: CT HEAD FINDINGS Brain: Ventricles and cisterns are unchanged. CSF spaces are within normal. There is evidence of an old right sided infarct involving the temporal and posterior parietal region. Associated dilatation of the atria and temporal horn of the right lateral ventricle. Mild chronic ischemic microvascular disease. Old left cerebellar infarct. No mass, mass effect, shift of midline structures or acute hemorrhage. No evidence of acute infarction. Vascular: Calcified plaque over the cavernous segment of the internal carotid arteries. Skull: Within normal. Sinuses/Orbits: Within normal. Other: None. CT CERVICAL SPINE FINDINGS Alignment: Within normal. The atlantoaxial articulation is within normal. Skull base and vertebrae: Moderate spondylosis throughout the cervical spine with prominent anterior osteophytes. Mild uncovertebral joint spurring. No acute fracture. Soft tissues and spinal canal: Spinal canal is within normal. Prevertebral soft tissues are normal. Disc levels:  Within normal. Upper chest: Within normal. Other: Moderate calcified plaque over the carotid bifurcations. IMPRESSION: No acute intracranial findings. 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 No acute cervical spine injury. Moderate spondylosis throughout the cervical spine. Electronically Signed   By: Elberta Fortis M.D.   On: 11/20/2016 15:47      Scheduled Meds: . aspirin EC  81 mg Oral Daily  . brimonidine  1 drop Both Eyes BID  . brinzolamide  1 drop Both Eyes BID  . enoxaparin (LOVENOX) injection  40 mg Subcutaneous Daily  . finasteride  5 mg Oral Daily  . lamoTRIgine  200 mg Oral BID  . latanoprost  1 drop Both Eyes QHS  . polyvinyl alcohol  1 drop Both Eyes 5  X Daily  . senna-docusate  2 tablet Oral Daily  . sodium chloride flush  3 mL Intravenous Q12H  . timolol  1 drop Both Eyes BID   Continuous Infusions:   LOS: 1  day    Time spent: 30 minutes   Noralee Stain, DO [facility] [email or web address] Password [facility] 11/22/2016, 10:41 AM

## 2016-11-22 NOTE — Progress Notes (Signed)
Went to room to change pt sheets with Annice PihJackie NT. Pt spitting, verbally abusive, combative. EEG technicians said they could complete tomorrow as patient's agitation would not yield accurate results. Pt given PRN Ativan. Obtained orders for renewal of soft bilateral wrist restraints.   Leonidas Rombergaitlin S Bumbledare, RN

## 2016-11-23 ENCOUNTER — Inpatient Hospital Stay (HOSPITAL_COMMUNITY): Payer: Medicare Other

## 2016-11-23 ENCOUNTER — Encounter (HOSPITAL_COMMUNITY): Payer: Self-pay | Admitting: *Deleted

## 2016-11-23 DIAGNOSIS — G40909 Epilepsy, unspecified, not intractable, without status epilepticus: Secondary | ICD-10-CM

## 2016-11-23 DIAGNOSIS — G9340 Encephalopathy, unspecified: Secondary | ICD-10-CM

## 2016-11-23 DIAGNOSIS — F0391 Unspecified dementia with behavioral disturbance: Secondary | ICD-10-CM

## 2016-11-23 DIAGNOSIS — Z515 Encounter for palliative care: Secondary | ICD-10-CM

## 2016-11-23 DIAGNOSIS — I4891 Unspecified atrial fibrillation: Secondary | ICD-10-CM

## 2016-11-23 DIAGNOSIS — I4892 Unspecified atrial flutter: Secondary | ICD-10-CM

## 2016-11-23 LAB — CBC
HEMATOCRIT: 29 % — AB (ref 39.0–52.0)
HEMOGLOBIN: 8.8 g/dL — AB (ref 13.0–17.0)
MCH: 25.4 pg — AB (ref 26.0–34.0)
MCHC: 30.3 g/dL (ref 30.0–36.0)
MCV: 83.8 fL (ref 78.0–100.0)
Platelets: 239 10*3/uL (ref 150–400)
RBC: 3.46 MIL/uL — ABNORMAL LOW (ref 4.22–5.81)
RDW: 18.1 % — ABNORMAL HIGH (ref 11.5–15.5)
WBC: 6.3 10*3/uL (ref 4.0–10.5)

## 2016-11-23 LAB — BASIC METABOLIC PANEL
Anion gap: 12 (ref 5–15)
BUN: 9 mg/dL (ref 6–20)
CHLORIDE: 107 mmol/L (ref 101–111)
CO2: 23 mmol/L (ref 22–32)
CREATININE: 1.1 mg/dL (ref 0.61–1.24)
Calcium: 9.1 mg/dL (ref 8.9–10.3)
GFR calc non Af Amer: >60 mL/min (ref >60)
GLUCOSE: 81 mg/dL (ref 65–99)
Potassium: 3.4 mmol/L — ABNORMAL LOW (ref 3.5–5.1)
Sodium: 142 mmol/L (ref 135–145)

## 2016-11-23 LAB — AMMONIA: Ammonia: 56 umol/L — ABNORMAL HIGH (ref 9–35)

## 2016-11-23 MED ORDER — SODIUM CHLORIDE 0.9 % IV SOLN
500.0000 mg | Freq: Two times a day (BID) | INTRAVENOUS | Status: DC
  Administered 2016-11-24: 500 mg via INTRAVENOUS
  Filled 2016-11-23 (×3): qty 5

## 2016-11-23 MED ORDER — POTASSIUM CHLORIDE 2 MEQ/ML IV SOLN
30.0000 meq | Freq: Once | INTRAVENOUS | Status: AC
  Administered 2016-11-23: 30 meq via INTRAVENOUS
  Filled 2016-11-23: qty 15

## 2016-11-23 MED ORDER — LAMOTRIGINE 100 MG PO TABS
200.0000 mg | ORAL_TABLET | Freq: Two times a day (BID) | ORAL | Status: DC
  Administered 2016-11-23 – 2016-11-27 (×8): 200 mg via ORAL
  Filled 2016-11-23: qty 8
  Filled 2016-11-23 (×2): qty 1
  Filled 2016-11-23 (×4): qty 2
  Filled 2016-11-23: qty 8
  Filled 2016-11-23: qty 2
  Filled 2016-11-23: qty 8
  Filled 2016-11-23 (×2): qty 2
  Filled 2016-11-23: qty 8
  Filled 2016-11-23: qty 2
  Filled 2016-11-23: qty 8
  Filled 2016-11-23: qty 2
  Filled 2016-11-23: qty 1

## 2016-11-23 MED ORDER — HYDRALAZINE HCL 20 MG/ML IJ SOLN
10.0000 mg | Freq: Four times a day (QID) | INTRAMUSCULAR | Status: DC | PRN
  Administered 2016-11-23 – 2016-11-24 (×2): 10 mg via INTRAVENOUS
  Filled 2016-11-23 (×2): qty 1

## 2016-11-23 NOTE — Progress Notes (Addendum)
CSW spoke to patients guardian via phone in regards to PT recommending SNF for the patient. Diane McClanton stated she is the guardian over the patient and she is unsure if she wants to send patient to SNF. Diane stated that if patient goes to SNF it would have to be in the [place] area to make it easier on her to visit patient. Diane stated she is unsure if patient will be able to return back to [facility] (ALF) because facility might be unable to give patient the care he needs. CSW will contact ALF to see if facility will be able to give patient the level of care he requires.    3:20 pm:  CSW spoke with [facility] facility. Facility stated that they are able to take patient back but patient would have to come with a sitter due to his confusion and inability to be redirected. Facility does not believe that patient returning to an ALF would benefit him as the facility does not have a SNF level of care.Facility stated they have informed patient guardian that if she still choice to bring patient back she would have to pay for a 24/hr sitter   Marrianne Moodshley Dyllan Hughett, MSW,  [employer] [phone]

## 2016-11-23 NOTE — Progress Notes (Signed)
EEG Completed; Results Pending  

## 2016-11-23 NOTE — Progress Notes (Signed)
[facility] PROGRESS NOTE  [name] QMV:[id number] DOB: 1946-04-26 DOA: 11/20/2016  [facility]  Brief History/Interval Summary: 71 y.o.malewith medical history significant of A Fib, dementia, multiple falls, seizures; presented after a fall and also encephalopathy. Patient had recently been discharged from the hospital on 3/14. He was admitted for toxic metabolic encephalopathy of unclear etiology. At that time, he was recommended to be discharged to skilled nursing facility, however, family had declined and took him back to assisted living facility with home health services. Early morning on 3/18, patient was found on the floor after an unwitnessed fall. He was sent to the emergency department where workup was largely unremarkable. He was sent back to assisted living home. He then presented back to the emergency department on the afternoon of 3/18 after patient was found crawling on the floor, being combative. He does have history of multiple falls and dementia, but had not been aggressive or combative in the past. He was given Haldol in the EMS, and Geodon in the emergency department and became bradycardic. Bradycardia has now resolved. Patient continues to be encephalopathic, although improved since admission.  Reason for Visit: Acute encephalopathy  Consultants: None  Procedures:  EEG IMPRESSION: This is an abnormal EEG demonstrating a mild diffuse slowing of electrocerebral activity.  This can be seen in a wide variety of encephalopathic state including those of a toxic, metabolic, or degenerative nature.  There were no focal, hemispheric, or lateralizing features.  No epileptiform activity was recorded.  As above, portions of the tracing were contaminated with myogenic artifact due to movement artifact from the patient, making these portions difficult to interpret.  Correlate clinically.  Transthoracic echocardiogram Study Conclusions  - Left  ventricle: The cavity size was normal. Wall thickness was   normal. Systolic function was normal. The estimated ejection   fraction was in the range of 60% to 65%. Wall motion was normal;   there were no regional wall motion abnormalities. Features are   consistent with a pseudonormal left ventricular filling pattern,   with concomitant abnormal relaxation and increased filling   pressure (grade 2 diastolic dysfunction). - Mitral valve: Mildly calcified annulus. - Right ventricle: Systolic function was mildly reduced. - Pulmonary arteries: Systolic pressure was moderately increased.   PA peak pressure: 53 mm Hg (S).  Antibiotics: None  Subjective/Interval History: Patient unable to see any questions. He remains confused.  ROS: Unable to do due to encephalopathy  Objective:  Vital Signs  Vitals:   11/22/16 0414 11/22/16 0428 11/22/16 1335 11/23/16 0512  BP: (!) 183/103 (!) 146/64 (!) 161/76 132/68  Pulse: (!) 141 75 75 61  Resp: (!) 22  17 18   Temp: 98.7 F (37.1 C)  98.7 F (37.1 C) 97.4 F (36.3 C)  TempSrc: Axillary  Oral Axillary  SpO2: 90%  100% 96%  Weight:      Height:        Intake/Output Summary (Last 24 hours) at 11/23/16 1308 Last data filed at 11/23/16 0403  Gross per 24 hour  Intake              320 ml  Output              400 ml  Net              -80 ml   Filed Weights   11/21/16 0459  Weight: 72.3 kg (159 lb 6.4 oz)    General appearance: Lethargic, arousable. Somewhat agitated. Resp:  clear to auscultation bilaterally Cardio: regular rate and rhythm, S1, S2 normal, no murmur, click, rub or gallop GI: soft, non-tender; bowel sounds normal; no masses,  no organomegaly Extremities: extremities normal, atraumatic, no cyanosis or edema Neurologic: Patient remains encephalopathic. Moving all extremities. No facial asymmetry.  Lab Results:  Data Reviewed: I have personally reviewed following labs and imaging studies  CBC:  Recent Labs Lab  11/16/16 2334 11/20/16 0051 11/20/16 1421 11/21/16 0235 11/22/16 0350 11/23/16 0457  WBC 6.7 7.7 5.9 7.0 5.9 6.3  NEUTROABS 4.9  --  4.3  --   --   --   HGB 9.5* 10.3* 9.3* 7.1* 8.6* 8.8*  HCT 30.7* 32.7* 30.1* 29.0* 28.4* 29.0*  MCV 82.1 84.1 83.6 86.6 84.8 83.8  PLT 266 311 242 238 230 239    Basic Metabolic Panel:  Recent Labs Lab 11/20/16 0051 11/20/16 1421 11/20/16 1816 11/21/16 0235 11/22/16 0350 11/23/16 0457  NA 138 141  --  142 141 142  K 4.1 3.7  --  4.6 3.8 3.4*  CL 106 108  --  113* 108 107  CO2 18* 20*  --  21* 21* 23  GLUCOSE 97 95  --  77 74 81  BUN 10 11  --  9 8 9   CREATININE 1.06 1.17  --  1.15 1.18 1.10  CALCIUM 8.8* 8.9  --  8.6* 8.8* 9.1  MG  --   --  1.9  --   --   --   PHOS  --   --  2.8  --   --   --     GFR: Estimated Creatinine Clearance: 61.2 mL/min (by C-G formula based on SCr of 1.1 mg/dL).  Liver Function Tests:  Recent Labs Lab 11/17/16 0556 11/20/16 1618  AST 25 44*  ALT 10* 19  ALKPHOS 77 76  BILITOT 1.6* 1.6*  PROT 7.2 7.0  ALBUMIN 3.4* 3.4*     Recent Labs Lab 11/20/16 1421  AMMONIA 49*    Coagulation Profile:  Recent Labs Lab 11/20/16 0051  INR 2.20    Cardiac Enzymes:  Recent Labs Lab 11/20/16 1421  CKTOTAL 435*    CBG:  Recent Labs Lab 11/20/16 2055 11/21/16 1114  GLUCAP 64* 79    Thyroid Function Tests:  Recent Labs  11/20/16 1816  TSH 4.465  FREET4 1.22*     Recent Results (from the past 240 hour(s))  Blood culture (routine x 2)     Status: None   Collection Time: 11/16/16  3:56 PM  Result Value Ref Range Status   Specimen Description BLOOD BLOOD LEFT FOREARM  Final   Special Requests BOTTLES DRAWN AEROBIC AND ANAEROBIC 5 CC EA  Final   Culture   Final    NO GROWTH 5 DAYS Performed at [facility], [address], [place], [place] [address]    Report Status 11/21/2016 FINAL  Final  Blood culture (routine x 2)     Status: None   Collection Time: 11/16/16  4:24 PM    Result Value Ref Range Status   Specimen Description BLOOD RIGHT ANTECUBITAL  Final   Special Requests BOTTLES DRAWN AEROBIC AND ANAEROBIC 5CC  Final   Culture   Final    NO GROWTH 5 DAYS Performed at [facility], [address], [place], [place] [address]    Report Status 11/21/2016 FINAL  Final  Culture, blood (routine x 2) Call MD if unable to obtain prior to antibiotics being given  Status: None   Collection Time: 11/16/16  8:20 PM  Result Value Ref Range Status   Specimen Description BLOOD RIGHT ARM  Final   Special Requests BOTTLES DRAWN AEROBIC ONLY 5CC  Final   Culture   Final    NO GROWTH 5 DAYS Performed at [facility], [address], [place], [place] [address]    Report Status 11/21/2016 FINAL  Final  Culture, blood (routine x 2) Call MD if unable to obtain prior to antibiotics being given     Status: None   Collection Time: 11/16/16  8:20 PM  Result Value Ref Range Status   Specimen Description BLOOD RIGHT HAND  Final   Special Requests IN PEDIATRIC BOTTLE 5CC  Final   Culture   Final    NO GROWTH 5 DAYS Performed at [facility], [address] [place], [place] [address]    Report Status 11/21/2016 FINAL  Final  MRSA PCR Screening     Status: None   Collection Time: 11/17/16 12:40 AM  Result Value Ref Range Status   MRSA by PCR NEGATIVE NEGATIVE Final    Comment:        The GeneXpert MRSA Assay (FDA approved for NASAL specimens only), is one component of a comprehensive MRSA colonization surveillance program. It is not intended to diagnose MRSA infection nor to guide or monitor treatment for MRSA infections.       Radiology Studies: No results found.   Medications:  Scheduled: . aspirin EC  81 mg Oral Daily  . brimonidine  1 drop Both Eyes BID  . brinzolamide  1 drop Both Eyes BID  . enoxaparin (LOVENOX) injection  40 mg Subcutaneous Daily  . finasteride  5 mg Oral Daily  . lamoTRIgine  200 mg Oral BID  . latanoprost  1  drop Both Eyes QHS  . polyvinyl alcohol  1 drop Both Eyes 5 X Daily  . senna-docusate  2 tablet Oral Daily  . sodium chloride flush  3 mL Intravenous Q12H  . timolol  1 drop Both Eyes BID   Continuous:  [id number] **OR** LORazepam  Assessment/Plan:  Principal Problem:   Acute encephalopathy Active Problems:   BPH (benign prostatic hyperplasia)   Seizure disorder (HCC)   Dementia with behavioral disturbance   Atrial fibrillation and flutter (HCC)   Dyslipidemia     Acute encephalopathy, unclear etiology  -With hx of dementia  -Patient was recently admitted and discharged on 3/14 due to toxic metabolic encephalopathy of unclear etiology, most likely due to polypharmacy. Patient had been on multiple medications at the time which were changed at time of discharge.  -Urinalysis negative for infectious process  -UDS negative  -CT head: no acute intracranial findings. Old strokes identified. -Neuro checks  -MRI brain was attempted but could not be completed due to agitation. EEG does not show any epileptiform activity. -Has required soft bilateral wrist restraints when he became physically abusive and combative with staff. - May have to repeat CT scan as patient has not been able to undergo MRI. Patient does have dementia baseline. This could just be worsening of his underlying disease. MRI brain from 2014 did show advanced atrophy. We'll discuss with family.   Paroxysmal atrial fibrillation, with bradycardia  -Chadsvasc greater than 3. Had been on Eliquis in the past. At last admission, risks and benefits were discussed. Due to multiple falls, anticoagulation was deemed too high risk. Continue aspirin -After admission was called, patient became bradycardic in the  ED. When patient is aroused, HR does go up into the high 50s, then returns to 30-40s. Per sister-in-law, patient was admitted to [facility] in late December 2017 due to A fib and low HR. At the time, pacemaker was  discussed but family decided not to pursue it. He eventually became stable and was discharged at that time. Unlikely that this is contributing to his current presentation. -Could be secondary to drug interaction, as his bradycardia started after haldol and geodon administration  -Now resolved, normal sinus rhythm on tele today   Minimal peripheral edema -BNP was 601.1. No evidence for overt fluid overload. -Echo as above. Grade 2 diastolic dysfunction is noted. Systolic function is normal. Hold off on any diuretics for now.  Multiple falls -Last admission, patient and familyhad refused SNF placement and was discharged to ALF with [facility] services  -CT head and cervical spine without acute injury  -Lumbar xray with possible L2 acute compression fracture. Unable to adequately assess for pain currently  -PT OT to evaluate when patient can cooperate   Chronic normocytic anemia -Work up consistent with anemia chronic disease   Seizures -Continue Lamictal. Previous records reviewed. It appears the patient was on Keppra to his last hospitalization a minute was discontinued for unclear reason. Could this be the reason for his encephalopathy?. Will resume at lower dose and monitor.  Hyperlipidemia -Pravachol   BPH -Continue finasteride  DVT Prophylaxis: Lovenox    Code Status: DO NOT RESUSCITATE  Family Communication: No family at bedside.  Disposition Plan: Resume Keppra. Monitor status closely.     LOS: 2 days   [facility]  [facility] Pager [phone] 11/23/2016, 1:08 PM  If 7PM-7AM, please contact night-coverage at [email or web address], password [facility]

## 2016-11-23 NOTE — Evaluation (Signed)
Occupational Therapy Evaluation Patient Details Name: [name] MRN: [id number] DOB: [birth year] Today's Date: 11/23/2016    History of Present Illness Pt is a 71 year old male admitted with encephalopathy and fall with recent D/C 3/16 with same.  PMHx significant for syncope, stroke, seizures, dementia, CAD, CABG, R tibia fracture surgery - 1960s   Clinical Impression   Pt unable to provide PLOF due to impaired cognition and lethargy. Pt only able to state name and occasionally answer yes/no questions during session. Pt required total assist for bed mobility and anticipate he would require total assist for ADL as he is currently not following commands to participate. Recommending SNF for follow up to maximize independence and safety with ADL and functional mobility prior to return to ALF. Pt would benefit from continued skilled OT to address established goals.    Follow Up Recommendations  SNF;Supervision/Assistance - 24 hour    Equipment Recommendations  Other (comment) (TBD at next venue)    Recommendations for Other Services       Precautions / Restrictions Precautions Precautions: Fall Restrictions Weight Bearing Restrictions: No      Mobility Bed Mobility Overal bed mobility: Needs Assistance Bed Mobility: Supine to Sit;Sit to Supine     Supine to sit: Total assist Sit to supine: Max assist   General bed mobility comments: Pt not following commands for bed mobility; total assist provided for pt from supine to sit. Pt attempting to bring LEs back to bed upon return to supine  Transfers                      Balance Overall balance assessment: Needs assistance;History of Falls Sitting-balance support: Feet supported;Bilateral upper extremity supported Sitting balance-Leahy Scale: Zero Sitting balance - Comments: Strong posterior lean; total assist provided for sitting balance Postural control: Posterior lean                                   ADL Overall ADL's : Needs assistance/impaired                                       General ADL Comments: Pt currently total assist for ADL; no following commands to participate.     Vision   Additional Comments: eyes closed throughout majority of session     Perception     Praxis      Pertinent Vitals/Pain Pain Assessment: Faces Faces Pain Scale: No hurt     Hand Dominance     Extremity/Trunk Assessment Upper Extremity Assessment Upper Extremity Assessment: Generalized weakness (difficult to assess due to level of arousal)   Lower Extremity Assessment Lower Extremity Assessment: Defer to PT evaluation   Cervical / Trunk Assessment Cervical / Trunk Assessment: Kyphotic   Communication Communication Communication: Expressive difficulties (difficult to understand)   Cognition Arousal/Alertness: Lethargic Behavior During Therapy: Flat affect Overall Cognitive Status: Difficult to assess                 General Comments: pt lethargic throughout session, eyes open upon entry to room but throughout session pt not opening eyes. Intermittently answering yes/no questions.   General Comments       Exercises       Shoulder Instructions      Home Living Family/patient expects to be discharged to:: Skilled nursing facility  Prior Functioning/Environment          Comments: pt unable to provide PLOF, no family present        OT Problem List: Decreased strength;Decreased range of motion;Decreased activity tolerance;Impaired balance (sitting and/or standing);Decreased cognition;Decreased knowledge of use of DME or AE;Decreased safety awareness      OT Treatment/Interventions: Self-care/ADL training;Therapeutic exercise;Energy conservation;DME and/or AE instruction;Therapeutic activities;Cognitive remediation/compensation;Patient/family education;Balance training    OT Goals(Current  goals can be found in the care plan section) Acute Rehab OT Goals Patient Stated Goal: none stated OT Goal Formulation: Patient unable to participate in goal setting Time For Goal Achievement: 12/07/16 Potential to Achieve Goals: Good ADL Goals Pt Will Perform Eating: with supervision;sitting Pt Will Perform Grooming: with supervision;sitting (x2 tasks) Additional ADL Goal #1: Pt will perform bed mobility at min guard level as precursor to ADL. Additional ADL Goal #2: Pt will follow one step command 50% of the time in a non distracting environment.  OT Frequency: Min 2X/week   Barriers to D/C:            Co-evaluation              End of Session Nurse Communication: Mobility status  Activity Tolerance: Patient limited by lethargy Patient left: in bed;with call bell/phone within reach;with bed alarm set;with restraints reapplied  OT Visit Diagnosis: Repeated falls (R29.6);Muscle weakness (generalized) (M62.81)                ADL either performed or assessed with clinical judgement  Time: 9604-5409 OT Time Calculation (min): 14 min Charges:  OT General Charges $OT Visit: 1 Procedure OT Evaluation $OT Eval Moderate Complexity: 1 Procedure G-Codes:     Chelcie Estorga A. Brett Albino, M.S., OTR/L Pager: [phone]  Gaye Alken 11/23/2016, 9:23 AM

## 2016-11-23 NOTE — Procedures (Signed)
HPI:  71 y/o with MS change and fall. Hx of seizure  TECHNICAL SUMMARY:  A multichannel referential and bipolar montage EEG using the standard international 10-20 system was performed on the patient described as confused.  The dominant background activity consists of 6-7 hertz activity seen most prominantly over the posterior head region.  Background reactivity to eye opening and closing cannot be determined, as the patient was confused and uncooperative.  Much myogenic artifact disrupts the recording and makes portions of the tracing difficult to interpret as the patient continually moved during the tracing.  Low voltage fast (beta) activity is distributed symmetrically and maximally over the anterior head regions.  ACTIVATION:  Stepwise photic stimulation and hyperventilation are not performed.  EPILEPTIFORM ACTIVITY:  There were no spikes, sharp waves or paroxysmal activity.  SLEEP:  No sleep was noted  CARDIAC:  The EKG lead was not well recorded.  IMPRESSION:  This is an abnormal EEG demonstrating a mild diffuse slowing of electrocerebral activity.  This can be seen in a wide variety of encephalopathic state including those of a toxic, metabolic, or degenerative nature.  There were no focal, hemispheric, or lateralizing features.  No epileptiform activity was recorded.  As above, portions of the tracing were contaminated with myogenic artifact due to movement artifact from the patient, making these portions difficult to interpret.  Correlate clinically.

## 2016-11-23 NOTE — Progress Notes (Signed)
Pt only IV access is in L AC. Unable to run IV infusions without line occluding as pt is bending arms to pull on restraints. 2 IV team RNs attempted. Dr. Rito EhrlichKrishnan updated.   Leonidas Rombergaitlin S Bumbledare, RN

## 2016-11-24 MED ORDER — LEVETIRACETAM 500 MG PO TABS
500.0000 mg | ORAL_TABLET | Freq: Two times a day (BID) | ORAL | Status: DC
  Administered 2016-11-24 – 2016-11-27 (×7): 500 mg via ORAL
  Filled 2016-11-24 (×10): qty 1

## 2016-11-24 MED ORDER — OLANZAPINE 5 MG PO TBDP
5.0000 mg | ORAL_TABLET | Freq: Every day | ORAL | Status: DC
  Administered 2016-11-24 – 2016-11-27 (×4): 5 mg via ORAL
  Filled 2016-11-24 (×5): qty 1

## 2016-11-24 MED ORDER — LACTULOSE 10 GM/15ML PO SOLN
10.0000 g | Freq: Two times a day (BID) | ORAL | Status: DC
  Administered 2016-11-24 – 2016-11-26 (×5): 10 g via ORAL
  Filled 2016-11-24 (×6): qty 15

## 2016-11-24 NOTE — Progress Notes (Signed)
[facility] Hospital [facility]ia[facility]:  RN  Received referral from WheelerKristi, [place]RN.   She advised that I will need to contact Rolla Flattenianne McClanton, sister in law/guardian, to get information.    Per Mrs. McClanton, will be in [place] until late Saturday night and will have to have patient's house cleaned out before she will be willing to accept any equipment.   She will not be able to bring patient home from hospital until same is taken care of.  I spoke with Kristi, Digest[facility] Care EndoscopyCMRN, who advised to hold off on referral until she can speak with MD tomorrow during progression.    I advised I will continue to monitor and appreciate the referral and look forward to helping the patient.   Thank you,  Adele BarthelAmy Evans, RN, BSN [facility] Hospital Liaison [phone]  All hospital liaison's are now on AMION.

## 2016-11-24 NOTE — Care Management Note (Addendum)
Case Management Note Donn PieriniKristi Nyra Anspaugh RN, BSN Unit 2W-Case Manager [phone]  Patient Details  Name: [name] MRN: [id number] Date of Birth: [birth year]  Subjective/Objective:   Pt presented with acue encephalopathy and fall- [facility]                  Action/Plan: PTA [facility] (CSW trying to find out which one)- [facility]/OT [facility]- [facility]- per PT eval recommendations for SNF- CSW to follow up on d/c needs.   Expected Discharge Date:            Expected Discharge Plan:  Home w Hospice Care  In-House Referral:  Clinical Social Work  Discharge planning Services  CM Consult  Post Acute Care Choice:  [facility]ospice [facility] to:  [facility] Guardian  DME Arranged:  Hospital bed, Hospice Equipment Package A DME Agency:  [facility]  HH Arranged:  RN, Disease Management HH Agency:  [facility]   Status of Service:  Completed, signed off  If discussed at Long Length of Stay Meetings, dates discussed:    Discharge Disposition: home with hospice   Additional Comments:  11/24/16- 1430- Donn PieriniKristi Thaily Hackworth RN, CM-  Pt remains in restraints, confusion improved, PC consulted for GOC, received referral to offer home hospice choice- call made to Diane McClanton-sil/guardian- [phone]- who is currently in [place]- discussed d/c plans and confirmed with her that she does not want [facility] to [facility] and instead plans to take pt home to his home- address confirmed as the one showing in epic- choice for home hospice offered- list reviewed of agencies over phone- per Diane- she would like to use [facility]- she also states that she would like a hospital bed for pt. Diane reports that pt will have around the clock care between herself, family members and hired care. She states that she will be returning on  Saturday from [place]. Referral called to Adele BarthelAmy Evans with [facility] at [phone]- for home hospice. CM to continue to follow for d/c needs- have notified CSW for plan for d/c along with [facility] let them know of plan for home with hospice.   Darrold SpanWebster, [place] Hall, RN 11/24/2016, 2:44 PM

## 2016-11-24 NOTE — Consult Note (Addendum)
Palliative Care Consult Note  71 yo with multiple chronic medical problems, admitted with AMS and has known vascular dementia with behavior disturbance. Delerium w/u negative except for urinary retension -bladder scan showed >400-once a foley was placed it seems to correlate that his symptoms improved. This is likely a chronic situation that will need additional consideration- including I/O cath or indwelling Foley. Appears calm at this time and is comfortable.  I discussed his care with his SIL and guardian Diane. Her plan is to take him to his home with hospice care. She is going to live with him and his grandchildren are supposed to help as well. Diane is a CNA and is getting her agency to also help with his care. She is currently in [place] but will return to [place] on Saturday. She is available by phone. The patient has his own 2 bedroom house and this is the only place he wants to be. We discussed prognosis of <6 month possible rapid decline given his pattern of poor PO intake, falls and admissions.   Added low dose zyprexa at night  Dc tele (risk for additional complications low- would not be a candidate for aggressive intervention)  Non-pharmacologic techniques to reduce agitation  Needs hospice referral  Discharge planning-home with 24/7 supervision and hospice.  Continue to manage his agitation with medications for comfort as ordered  Urinary retention- Foley vs I/O cath  Time: 50 min Greater than 50%  of this time was spent counseling and coordinating care related to the above assessment and plan.   Anderson MaltaElizabeth Jay Haskew, DO Palliative Medicine [phone]

## 2016-11-24 NOTE — Progress Notes (Signed)
[facility] PROGRESS NOTE  [name] UJW:[id number] DOB: 09/05/1946 DOA: 11/20/2016  [facility]  Brief History/Interval Summary: 71 y.o.malewith medical history significant of A Fib, dementia, multiple falls, seizures; presented after a fall and also encephalopathy. Patient had recently been discharged from the hospital on 3/14. He was admitted for toxic metabolic encephalopathy of unclear etiology. At that time, he was recommended to be discharged to skilled nursing facility, however, family had declined and took him back to assisted living facility with home health services. Early morning on 3/18, patient was found on the floor after an unwitnessed fall. He was sent to the emergency department where workup was largely unremarkable. He was sent back to assisted living home. He then presented back to the emergency department on the afternoon of 3/18 after patient was found crawling on the floor, being combative. He does have history of multiple falls and dementia, but had not been aggressive or combative in the past. He was given Haldol in the EMS, and Geodon in the emergency department and became bradycardic. Bradycardia has now resolved. Patient continues to be encephalopathic, although improved since admission.  Reason for Visit: Acute encephalopathy  Consultants: None  Procedures:  EEG IMPRESSION: This is an abnormal EEG demonstrating a mild diffuse slowing of electrocerebral activity.  This can be seen in a wide variety of encephalopathic state including those of a toxic, metabolic, or degenerative nature.  There were no focal, hemispheric, or lateralizing features.  No epileptiform activity was recorded.  As above, portions of the tracing were contaminated with myogenic artifact due to movement artifact from the patient, making these portions difficult to interpret.  Correlate clinically.  Transthoracic echocardiogram Study Conclusions  - Left  ventricle: The cavity size was normal. Wall thickness was   normal. Systolic function was normal. The estimated ejection   fraction was in the range of 60% to 65%. Wall motion was normal;   there were no regional wall motion abnormalities. Features are   consistent with a pseudonormal left ventricular filling pattern,   with concomitant abnormal relaxation and increased filling   pressure (grade 2 diastolic dysfunction). - Mitral valve: Mildly calcified annulus. - Right ventricle: Systolic function was mildly reduced. - Pulmonary arteries: Systolic pressure was moderately increased.   PA peak pressure: 53 mm Hg (S).  Antibiotics: None  Subjective/Interval History: Patient seems to be calmer this morning. He did attend to answer my questions. He did open his eyes. [facility]  ROS: Unable to do due to encephalopathy  Objective:  Vital Signs  Vitals:   11/22/16 1335 11/23/16 0512 11/23/16 2233 11/24/16 0402  BP: (!) 161/76 132/68 (!) 148/92 (!) 180/93  Pulse: 75 61 73 86  Resp: 17 18 18  (!) 22  Temp: 98.7 F (37.1 C) 97.4 F (36.3 C) 97.7 F (36.5 C) 97.9 F (36.6 C)  TempSrc: Oral Axillary Axillary Axillary  SpO2: 100% 96% 96% 100%  Weight:      Height:        Intake/Output Summary (Last 24 hours) at 11/24/16 1244 Last data filed at 11/24/16 0739  Gross per 24 hour  Intake              700 ml  Output             1375 ml  Net             -675 ml   Filed  Weights   11/21/16 0459  Weight: 72.3 kg (159 lb 6.4 oz)    General appearance: Awake. No distress. Resp: clear to auscultation bilaterally Cardio: regular rate and rhythm, S1, S2 normal, no murmur, click, rub or gallop GI: soft, non-tender; bowel sounds normal; no masses,  no organomegaly Extremities: extremities normal, atraumatic, no cyanosis or edema Neurologic: Mental status appears to have improved. He did answer a few questions, however, remains distracted.  Moving all extremities. No facial asymmetry.  Lab Results:  Data Reviewed: I have personally reviewed following labs and imaging studies  CBC:  Recent Labs Lab 11/20/16 0051 11/20/16 1421 11/21/16 0235 11/22/16 0350 11/23/16 0457  WBC 7.7 5.9 7.0 5.9 6.3  NEUTROABS  --  4.3  --   --   --   HGB 10.3* 9.3* 7.1* 8.6* 8.8*  HCT 32.7* 30.1* 29.0* 28.4* 29.0*  MCV 84.1 83.6 86.6 84.8 83.8  PLT 311 242 238 230 239    Basic Metabolic Panel:  Recent Labs Lab 11/20/16 0051 11/20/16 1421 11/20/16 1816 11/21/16 0235 11/22/16 0350 11/23/16 0457  NA 138 141  --  142 141 142  K 4.1 3.7  --  4.6 3.8 3.4*  CL 106 108  --  113* 108 107  CO2 18* 20*  --  21* 21* 23  GLUCOSE 97 95  --  77 74 81  BUN 10 11  --  9 8 9   CREATININE 1.06 1.17  --  1.15 1.18 1.10  CALCIUM 8.8* 8.9  --  8.6* 8.8* 9.1  MG  --   --  1.9  --   --   --   PHOS  --   --  2.8  --   --   --     GFR: Estimated Creatinine Clearance: 61.2 mL/min (by C-G formula based on SCr of 1.1 mg/dL).  Liver Function Tests:  Recent Labs Lab 11/20/16 1618  AST 44*  ALT 19  ALKPHOS 76  BILITOT 1.6*  PROT 7.0  ALBUMIN 3.4*     Recent Labs Lab 11/20/16 1421 11/23/16 1415  AMMONIA 49* 56*    Coagulation Profile:  Recent Labs Lab 11/20/16 0051  INR 2.20    Cardiac Enzymes:  Recent Labs Lab 11/20/16 1421  CKTOTAL 435*    CBG:  Recent Labs Lab 11/20/16 2055 11/21/16 1114  GLUCAP 64* 79    Thyroid Function Tests: No results for input(s): TSH, T4TOTAL, FREET4, T3FREE, THYROIDAB in the last 72 hours.   Recent Results (from the past 240 hour(s))  Blood culture (routine x 2)     Status: None   Collection Time: 11/16/16  3:56 PM  Result Value Ref Range Status   Specimen Description BLOOD BLOOD LEFT FOREARM  Final   Special Requests BOTTLES DRAWN AEROBIC AND ANAEROBIC 5 CC EA  Final   Culture   Final    NO GROWTH 5 DAYS Performed at [facility], [address] [place], [place]  [address]    Report Status 11/21/2016 FINAL  Final  Blood culture (routine x 2)     Status: None   Collection Time: 11/16/16  4:24 PM  Result Value Ref Range Status   Specimen Description BLOOD RIGHT ANTECUBITAL  Final   Special Requests BOTTLES DRAWN AEROBIC AND ANAEROBIC 5CC  Final   Culture   Final    NO GROWTH 5 DAYS Performed at [facility], [address] [place], [place] [address]    Report Status 11/21/2016  FINAL  Final  Culture, blood (routine x 2) Call MD if unable to obtain prior to antibiotics being given     Status: None   Collection Time: 11/16/16  8:20 PM  Result Value Ref Range Status   Specimen Description BLOOD RIGHT ARM  Final   Special Requests BOTTLES DRAWN AEROBIC ONLY 5CC  Final   Culture   Final    NO GROWTH 5 DAYS Performed at [facility], [address] [place], [place] [address]    Report Status 11/21/2016 FINAL  Final  Culture, blood (routine x 2) Call MD if unable to obtain prior to antibiotics being given     Status: None   Collection Time: 11/16/16  8:20 PM  Result Value Ref Range Status   Specimen Description BLOOD RIGHT HAND  Final   Special Requests IN PEDIATRIC BOTTLE 5CC  Final   Culture   Final    NO GROWTH 5 DAYS Performed at [facility], [address] [place], [place] [address]    Report Status 11/21/2016 FINAL  Final  MRSA PCR Screening     Status: None   Collection Time: 11/17/16 12:40 AM  Result Value Ref Range Status   MRSA by PCR NEGATIVE NEGATIVE Final    Comment:        The GeneXpert MRSA Assay (FDA approved for NASAL specimens only), is one component of a comprehensive MRSA colonization surveillance program. It is not intended to diagnose MRSA infection nor to guide or monitor treatment for MRSA infections.       Radiology Studies: No results found.   Medications:  Scheduled: . aspirin EC  81 mg Oral Daily  . brimonidine  1 drop Both Eyes BID  . brinzolamide  1 drop Both Eyes BID  .  enoxaparin (LOVENOX) injection  40 mg Subcutaneous Daily  . finasteride  5 mg Oral Daily  . lactulose  10 g Oral BID  . lamoTRIgine  200 mg Oral BID  . latanoprost  1 drop Both Eyes QHS  . levETIRAcetam  500 mg Oral BID  . polyvinyl alcohol  1 drop Both Eyes 5 X Daily  . senna-docusate  2 tablet Oral Daily  . sodium chloride flush  3 mL Intravenous Q12H  . timolol  1 drop Both Eyes BID   Continuous:  [id number]:[id number], LORazepam **OR** LORazepam  Assessment/Plan:  Principal Problem:   Acute encephalopathy Active Problems:   BPH (benign prostatic hyperplasia)   Seizure disorder (HCC)   Dementia with behavioral disturbance   Atrial fibrillation and flutter (HCC)   Dyslipidemia     Acute encephalopathy, unclear etiology  -With hx of dementia  -Patient was recently admitted and discharged on 3/14 due to toxic metabolic encephalopathy of unclear etiology, most likely due to polypharmacy. Patient had been on multiple medications at the time which were changed at time of discharge.  -Urinalysis negative for infectious process. UDS negative. CT head: no acute intracranial findings. Old strokes identified. -MRI brain was attempted but could not be completed due to agitation. EEG does not show any epileptiform activity. -Has required soft bilateral wrist restraints when he became physically abusive and combative with staff. - Patient's mental status appears to have improved some this morning. Does not show any evidence for neurological deficits. Unlikely to be able to cooperate with MRI. Previous symptoms have not been successful. Patient does have dementia baseline. This could just be worsening of his underlying disease. MRI brain from 2014 did show advanced  atrophy. Discussed in detail with patient's guardian. She is able to pursuing palliative care and hospice. Palliative medicine has been consulted.   Paroxysmal atrial fibrillation, with bradycardia  -Chadsvasc greater than 3. Had  been on Eliquis in the past. At last admission, risks and benefits were discussed. Due to multiple falls, anticoagulation was deemed too high risk. Continue aspirin -After admission was called, patient became bradycardic in the ED. thought to be due to medications. Now improved. Per sister-in-law, patient was admitted to [facility] in late December 2017 due to A fib and low HR. At the time, pacemaker was discussed but family decided not to pursue it. He eventually became stable and was discharged at that time. Unlikely that this is contributing to his current presentation. -normal sinus rhythm on tele   Minimal peripheral edema -BNP was 601.1. No evidence for overt fluid overload. -Echo as above. Grade 2 diastolic dysfunction is noted. Systolic function is normal. Hold off on any diuretics for now due to poor oral intake.  Multiple falls -Last admission, patient and familyhad refused SNF placement and was discharged to ALF with [facility] services  -CT head and cervical spine without acute injury  -Lumbar xray with possible L2 acute compression fracture. Unable to adequately assess for pain currently  -PT OT to evaluate when patient can cooperate. Speech therapy to see for swallow evaluation.  Chronic normocytic anemia -Work up consistent with anemia chronic disease   Seizures -Continue Lamictal. Previous records reviewed. It appears the patient was on Keppra up until his last hospitalization when it was discontinued for unclear reason. Could this be the reason for his encephalopathy?. Keppra was resumed. Continue for now.   Hyperlipidemia -Pravachol   BPH -Continue finasteride  DVT Prophylaxis: Lovenox    Code Status: DO NOT RESUSCITATE  Family Communication: No family at bedside. Discussed with his guardian yesterday. Disposition Plan: Await palliative medicine input.    LOS: 3 days   [facility]  [facility] Pager [phone] 11/24/2016, 12:44 PM  If 7PM-7AM, please  contact night-coverage at [email or web address], password [facility]

## 2016-11-24 NOTE — Progress Notes (Signed)
SLP Cancellation Note  Patient Details Name: Morrison OldGeorge L Mcneice MRN: [id number] DOB: 27-Sep-1945   Cancelled treatment:       Reason Eval/Treat Not Completed: Other (comment) Pt confused, combative, in soft wrist restraints, attempting to bite.  Nursing states he was calmer this am and had no difficulty with breakfast.  Will attempt again next date, if appropriate.   Blenda MountsCouture, Rand Etchison Laurice 11/24/2016, 11:49 AM

## 2016-11-25 LAB — BASIC METABOLIC PANEL
ANION GAP: 10 (ref 5–15)
BUN: 7 mg/dL (ref 6–20)
CO2: 25 mmol/L (ref 22–32)
Calcium: 9 mg/dL (ref 8.9–10.3)
Chloride: 101 mmol/L (ref 101–111)
Creatinine, Ser: 0.97 mg/dL (ref 0.61–1.24)
GFR calc Af Amer: >60 mL/min (ref >60)
GFR calc non Af Amer: >60 mL/min (ref >60)
Glucose, Bld: 105 mg/dL — ABNORMAL HIGH (ref 65–99)
Potassium: 2.8 mmol/L — ABNORMAL LOW (ref 3.5–5.1)
Sodium: 136 mmol/L (ref 135–145)

## 2016-11-25 LAB — CBC
HEMATOCRIT: 30.9 % — AB (ref 39.0–52.0)
HEMOGLOBIN: 9.7 g/dL — AB (ref 13.0–17.0)
MCH: 25.6 pg — ABNORMAL LOW (ref 26.0–34.0)
MCHC: 31.4 g/dL (ref 30.0–36.0)
MCV: 81.5 fL (ref 78.0–100.0)
Platelets: 262 10*3/uL (ref 150–400)
RBC: 3.79 MIL/uL — ABNORMAL LOW (ref 4.22–5.81)
RDW: 18.4 % — ABNORMAL HIGH (ref 11.5–15.5)
WBC: 6.9 10*3/uL (ref 4.0–10.5)

## 2016-11-25 MED ORDER — SODIUM CHLORIDE 0.9 % IV SOLN
30.0000 meq | Freq: Once | INTRAVENOUS | Status: AC
  Administered 2016-11-25: 30 meq via INTRAVENOUS
  Filled 2016-11-25: qty 15

## 2016-11-25 MED ORDER — POTASSIUM CHLORIDE CRYS ER 20 MEQ PO TBCR
40.0000 meq | EXTENDED_RELEASE_TABLET | Freq: Once | ORAL | Status: AC
  Administered 2016-11-25: 40 meq via ORAL
  Filled 2016-11-25: qty 2

## 2016-11-25 NOTE — Progress Notes (Addendum)
[facility] Hospital Liaison:  RN  Notified by Marcelyn BruinsKrisiti, CMRN, of patient/family request for [facility] at [facility] after discharge.  Chart and patient information reviewed with Dr. Berlinda Last. Monguilod, [facility] Medical Director.  Hospice eligibility confirmed.   Writer spoke with Graciella Beltonianne, sister in law, over the phone - yesterday - to initiate education related to hospice philosophy, services and team approach to care.  Family voiced understanding of information provided.  Per discussion, plan is for discharge to home by [facility]AR on 11/27/16 or 11/28/16.    Please send signed and completed DNR form home with patient/family.  Patient will need prescriptions for discharge comfort medication.  DME needs discussed.  Patient currently has a walker, but doesn't have other equipment.   Family requests the following DME for delivery to the home:  Hospital bed 1/2 rails, OBT - [facility].  [facility] Research scientist (physical sciences)equipment manager, Loistine SimasJewel Hughes, notified and will contact [facility] to arrange delivery to the home.  The home address has been verified and is correct in the chart.  Rolla FlattenDianne McClanton is the family member to be contacted to arrange time of delivery.  [facility] Referral Center aware of above.  Completed d/c summary will need to be [facility]ed to [facility] at [phone], when final.  Please notify [facility] when patient is ready to leave unit at discharge - call [phone] after 5pm).  Above information shared with Silva BandyKristi, [facility].  Please feel free to call with any hospice related questions.  Thank you for the referral,  Adele Barthelmy Evans, RN, BSN [facility] Hospital Liaison [phone]  All hospital liaison's are now on AMION.

## 2016-11-25 NOTE — Care Management Note (Signed)
Case Management Note Donn Pierini RN, BSN Unit 2W-Case Manager [phone]  Patient Details  Name: [name] MRN: [id number] Date of Birth: 1945-09-15  Subjective/Objective:   Pt presented with acue encephalopathy and fall- was just discharged from [facility] on 11/19/16                  Action/Plan: PTA pt was apparently at [facility]F (CSW trying to find out which one)- per [facility] pt is active with them for HHRN/PT/OT services- will resume [facility] services for discharge if pt returns to [place]- per PT eval recommendations for SNF- CSW to follow up on d/c needs.   Expected Discharge Date:            Expected Discharge Plan:  Home w Hospice Care  In-House Referral:  Clinical Social Work  Discharge planning Services  CM Consult  Post Acute Care Choice:  Hospice Choice offered to:  [facility] POA / Guardian  DME Arranged:  Hospital bed, Hospice Equipment Package A DME Agency:  [facility]  HH Arranged:  RN, Disease Management HH Agency:  [facility]  Status of Service:  Completed, signed off  If discussed at Long Length of Stay Meetings, dates discussed:    Discharge Disposition: home with hospice   Additional Comments:  11/25/16- 1530- Donn Pierini RN, CM- per MD pt should be ready for discharge over the weekend once guardian returns to town- per Amy with [facility] pt has been accepted to Hospice services and they are prepared to accept pt when pt ready for discharge- pt will need hospital bed and [facility] will be working with family on delivery- call made to pt's sil-diane- to discuss discharge over the weekend- she states that she needs time on return to "get pt's house in order" before she brings him home- she is considering having pt return to [facility] if they will take him and hire someone to stay with him until she has the house in order- had CSW call [facility] to see if they would allow pt to return- per CSW Chip Boer will not let pt return at  current level of care- he needs more care than they can provide- CSW may need to look at SNF level- and then transition pt into home hospice- have spoken with Amy at [facility]- who states that they can follow pt at SNF as long as pt is not there for rehab and then transition to home. CSW to f/u with Diane for possible SNF option-  Amy with [facility] contact # is [phone] if needed over the weekend.   11/24/16- 1430- Paula Busenbark RN, CM-  Pt remains in restraints, confusion improved, PC consulted for GOC, received referral to offer home hospice choice- call made to Diane McClanton-sil/guardian- [phone]- who is currently in [place]- discussed d/c plans and confirmed with her that she does not want pt to go back to [facility] and instead plans to take pt home to his home- address confirmed as the one showing in epic- choice for home hospice offered- list reviewed of agencies over phone- per Diane- she would like to use [facility]- she also states that she would like a hospital bed for pt. Diane reports that pt will have around the clock care between herself, family members and hired care. She states that she will be returning on Saturday from [place]. Referral called to Adele Barthel with [facility] at [phone]- for home hospice. CM to continue to follow for d/c needs- have notified  CSW for plan for d/c along with [facility] to let them know of plan for home with hospice.   Darrold SpanWebster, Jamier Urbas Hall, RN 11/25/2016, 3:46 PM

## 2016-11-25 NOTE — Care Management Important Message (Signed)
Important Message  Patient Details  Name: [name] MRN: [id number] Date of Birth: [birth year]   Medicare Important Message Given:  Yes    Kyla BalzarineShealy, Chaston Bradburn Abena 11/25/2016, 11:54 AM

## 2016-11-25 NOTE — Progress Notes (Signed)
[facility] PROGRESS NOTE  [name] ZOX:[id number] DOB: Dec 25, 1945 DOA: 11/20/2016  PCP: [facility]  Brief History/Interval Summary: 71 y.o.malewith medical history significant of A Fib, dementia, multiple falls, seizures; presented after a fall and also encephalopathy. Patient had recently been discharged from the hospital on 3/14. He was admitted for toxic metabolic encephalopathy of unclear etiology. At that time, he was recommended to be discharged to skilled nursing facility, however, family had declined and took him back to assisted living facility with home health services. Early morning on 3/18, patient was found on the floor after an unwitnessed fall. He was sent to the emergency department where workup was largely unremarkable. He was sent back to assisted living home. He then presented back to the emergency department on the afternoon of 3/18 after patient was found crawling on the floor, being combative. He does have history of multiple falls and dementia, but had not been aggressive or combative in the past. He was given Haldol in the EMS, and Geodon in the emergency department and became bradycardic. Bradycardia has now resolved. Patient continues to be encephalopathic, although improved slightly since admission.  Reason for Visit: Acute encephalopathy  Consultants: Palliative medicine  Procedures:  EEG IMPRESSION: This is an abnormal EEG demonstrating a mild diffuse slowing of electrocerebral activity.  This can be seen in a wide variety of encephalopathic state including those of a toxic, metabolic, or degenerative nature.  There were no focal, hemispheric, or lateralizing features.  No epileptiform activity was recorded.  As above, portions of the tracing were contaminated with myogenic artifact due to movement artifact from the patient, making these portions difficult to interpret.  Correlate clinically.  Transthoracic echocardiogram Study  Conclusions  - Left ventricle: The cavity size was normal. Wall thickness was   normal. Systolic function was normal. The estimated ejection   fraction was in the range of 60% to 65%. Wall motion was normal;   there were no regional wall motion abnormalities. Features are   consistent with a pseudonormal left ventricular filling pattern,   with concomitant abnormal relaxation and increased filling   pressure (grade 2 diastolic dysfunction). - Mitral valve: Mildly calcified annulus. - Right ventricle: Systolic function was mildly reduced. - Pulmonary arteries: Systolic pressure was moderately increased.   PA peak pressure: 53 mm Hg (S).  Antibiotics: None  Subjective/Interval History: Patient seems to be calm this morning. Mumbles. Not answering any questions this morning.   ROS: Unable to do due to encephalopathy  Objective:  Vital Signs  Vitals:   11/24/16 0402 11/24/16 1316 11/24/16 2157 11/25/16 0503  BP: (!) 180/93 (!) 156/82 121/72 (!) 145/86  Pulse: 86 70 76 72  Resp: (!) 22 17 18 18   Temp: 97.9 F (36.6 C) 98.7 F (37.1 C) 98 F (36.7 C) 98.2 F (36.8 C)  TempSrc: Axillary Axillary Axillary Axillary  SpO2: 100% 98% 99% 96%  Weight:      Height:        Intake/Output Summary (Last 24 hours) at 11/25/16 0942 Last data filed at 11/24/16 2153  Gross per 24 hour  Intake              460 ml  Output              926 ml  Net             -466 ml   Filed Weights   11/21/16 0459  Weight: 72.3 kg (159 lb 6.4 oz)  General appearance: Awake. No distress. Resp: clear to auscultation bilaterally Cardio: regular rate and rhythm, S1, S2 normal, no murmur, click, rub or gallop GI: soft, non-tender; bowel sounds normal; no masses,  no organomegaly Extremities: extremities normal, atraumatic, no cyanosis or edema Neurologic: Mental status appears to be waxing and waning.  Moving all his extremities  Lab Results:  Data Reviewed: I have personally reviewed following  labs and imaging studies  CBC:  Recent Labs Lab 11/20/16 1421 11/21/16 0235 11/22/16 0350 11/23/16 0457 11/25/16 0257  WBC 5.9 7.0 5.9 6.3 6.9  NEUTROABS 4.3  --   --   --   --   HGB 9.3* 7.1* 8.6* 8.8* 9.7*  HCT 30.1* 29.0* 28.4* 29.0* 30.9*  MCV 83.6 86.6 84.8 83.8 81.5  PLT 242 238 230 239 262    Basic Metabolic Panel:  Recent Labs Lab 11/20/16 1421 11/20/16 1816 11/21/16 0235 11/22/16 0350 11/23/16 0457 11/25/16 0257  NA 141  --  142 141 142 136  K 3.7  --  4.6 3.8 3.4* 2.8*  CL 108  --  113* 108 107 101  CO2 20*  --  21* 21* 23 25  GLUCOSE 95  --  77 74 81 105*  BUN 11  --  9 8 9 7   CREATININE 1.17  --  1.15 1.18 1.10 0.97  CALCIUM 8.9  --  8.6* 8.8* 9.1 9.0  MG  --  1.9  --   --   --   --   PHOS  --  2.8  --   --   --   --     GFR: Estimated Creatinine Clearance: 69.4 mL/min (by C-G formula based on SCr of 0.97 mg/dL).  Liver Function Tests:  Recent Labs Lab 11/20/16 1618  AST 44*  ALT 19  ALKPHOS 76  BILITOT 1.6*  PROT 7.0  ALBUMIN 3.4*     Recent Labs Lab 11/20/16 1421 11/23/16 1415  AMMONIA 49* 56*    Coagulation Profile:  Recent Labs Lab 11/20/16 0051  INR 2.20    Cardiac Enzymes:  Recent Labs Lab 11/20/16 1421  CKTOTAL 435*    CBG:  Recent Labs Lab 11/20/16 2055 11/21/16 1114  GLUCAP 64* 79    Thyroid Function Tests: No results for input(s): TSH, T4TOTAL, FREET4, T3FREE, THYROIDAB in the last 72 hours.   Recent Results (from the past 240 hour(s))  Blood culture (routine x 2)     Status: None   Collection Time: 11/16/16  3:56 PM  Result Value Ref Range Status   Specimen Description BLOOD BLOOD LEFT FOREARM  Final   Special Requests BOTTLES DRAWN AEROBIC AND ANAEROBIC 5 CC EA  Final   Culture   Final    NO GROWTH 5 DAYS Performed at [facility], [address] [place], [place] [address]    Report Status 11/21/2016 FINAL  Final  Blood culture (routine x 2)     Status: None   Collection Time:  11/16/16  4:24 PM  Result Value Ref Range Status   Specimen Description BLOOD RIGHT ANTECUBITAL  Final   Special Requests BOTTLES DRAWN AEROBIC AND ANAEROBIC 5CC  Final   Culture   Final    NO GROWTH 5 DAYS Performed at [facility], [address] [place], [place] [address]    Report Status 11/21/2016 FINAL  Final  Culture, blood (routine x 2) Call MD if unable to obtain prior to antibiotics being given  Status: None   Collection Time: 11/16/16  8:20 PM  Result Value Ref Range Status   Specimen Description BLOOD RIGHT ARM  Final   Special Requests BOTTLES DRAWN AEROBIC ONLY 5CC  Final   Culture   Final    NO GROWTH 5 DAYS Performed at [facility], [address] [place], [place] [address]    Report Status 11/21/2016 FINAL  Final  Culture, blood (routine x 2) Call MD if unable to obtain prior to antibiotics being given     Status: None   Collection Time: 11/16/16  8:20 PM  Result Value Ref Range Status   Specimen Description BLOOD RIGHT HAND  Final   Special Requests IN PEDIATRIC BOTTLE 5CC  Final   Culture   Final    NO GROWTH 5 DAYS Performed at [facility], [address], [place], [place] [address]    Report Status 11/21/2016 FINAL  Final  MRSA PCR Screening     Status: None   Collection Time: 11/17/16 12:40 AM  Result Value Ref Range Status   MRSA by PCR NEGATIVE NEGATIVE Final    Comment:        The GeneXpert MRSA Assay (FDA approved for NASAL specimens only), is one component of a comprehensive MRSA colonization surveillance program. It is not intended to diagnose MRSA infection nor to guide or monitor treatment for MRSA infections.       Radiology Studies: No results found.   Medications:  Scheduled: . aspirin EC  81 mg Oral Daily  . brimonidine  1 drop Both Eyes BID  . brinzolamide  1 drop Both Eyes BID  . enoxaparin (LOVENOX) injection  40 mg Subcutaneous Daily  . finasteride  5 mg Oral Daily  . lactulose  10 g Oral BID  .  lamoTRIgine  200 mg Oral BID  . latanoprost  1 drop Both Eyes QHS  . levETIRAcetam  500 mg Oral BID  . OLANZapine zydis  5 mg Oral QHS  . polyvinyl alcohol  1 drop Both Eyes 5 X Daily  . potassium chloride (KCL MULTIRUN) 30 mEq in 265 mL IVPB  30 mEq Intravenous Once  . potassium chloride  40 mEq Oral Once  . senna-docusate  2 tablet Oral Daily  . sodium chloride flush  3 mL Intravenous Q12H  . timolol  1 drop Both Eyes BID   Continuous:  HQI:[id number], LORazepam **OR** LORazepam  Assessment/Plan:  Principal Problem:   Acute encephalopathy Active Problems:   BPH (benign prostatic hyperplasia)   Seizure disorder (HCC)   Dementia with behavioral disturbance   Atrial fibrillation and flutter (HCC)   Dyslipidemia     Acute encephalopathy, unclear etiology  -With hx of dementia  -Patient was recently admitted and discharged on 3/14 due to toxic metabolic encephalopathy of unclear etiology, most likely due to polypharmacy. Patient had been on multiple medications at the time which were changed at time of discharge.  -Urinalysis negative for infectious process. UDS negative. CT head: no acute intracranial findings. Old strokes identified. -MRI brain was attempted but could not be completed due to agitation. EEG does not show any epileptiform activity. -Has required soft bilateral wrist restraints when he became physically abusive and combative with staff. - Patient's mental status appears to Be waxing and waning to some extent. Does not show any evidence for neurological deficits. Unlikely to be able to cooperate with MRI. Previous symptoms have not been successful. Patient does have dementia baseline. This could just  be worsening of his underlying disease. MRI brain from 2014 did show advanced atrophy. Discussed in detail with patient's guardian. She is able to pursuing palliative care and hospice. Palliative medicine was consulted. He is thought appropriate for hospice. Hospice has  been consulted. Plan is for him to go home with hospice services once a sister-in-law returns from [place] on Saturday.  Paroxysmal atrial fibrillation, with bradycardia  -Chadsvasc greater than 3. Had been on Eliquis in the past. At last admission, risks and benefits were discussed. Due to multiple falls, anticoagulation was deemed too high risk. Continue aspirin -After admission was called, patient became bradycardic in the ED. thought to be due to medications. Now improved. Per sister-in-law, patient was admitted to [facility] in late December 2017 due to A fib and low HR. At the time, pacemaker was discussed but family decided not to pursue it. He eventually became stable and was discharged at that time. Unlikely that this is contributing to his current presentation. -normal sinus rhythm on tele   Minimal peripheral edema -BNP was 601.1. No evidence for overt fluid overload. -Echo as above. Grade 2 diastolic dysfunction is noted. Systolic function is normal. Hold off on any diuretics for now due to poor oral intake.  Multiple falls -Last admission, patient and familyhad refused SNF placement and was discharged to ALF with [facility] services  -CT head and cervical spine without acute injury  -Lumbar xray with possible L2 acute compression fracture. Unable to adequately assess for pain currently  -PT OT to evaluate when patient can cooperate. Speech therapy to see for swallow evaluation.  Chronic normocytic anemia -Work up consistent with anemia chronic disease   Seizures -Continue Lamictal. Previous records reviewed. It appears the patient was on Keppra up until his last hospitalization when it was discontinued for unclear reason. Keppra was resumed. Continue for now.   Hyperlipidemia -Pravachol   BPH -Continue finasteride  Hypokalemia. Tto be repleted.  DVT Prophylaxis: Lovenox    Code Status: DO NOT RESUSCITATE  Family Communication: No family at bedside.  Disposition Plan:  Plan is for home with hospice services this weekend.    LOS: 4 days   [facility]  [facility] Pager [phone] 11/25/2016, 9:42 AM  If 7PM-7AM, please contact night-coverage at [email or web address], password [facility]

## 2016-11-25 NOTE — Evaluation (Signed)
Clinical/Bedside Swallow Evaluation Patient Details  Name: [name] MRN: [id number] Date of Birth: [birth year]  Today's Date: 11/25/2016 Time: SLP Start Time (ACUTE ONLY): 1425 SLP Stop Time (ACUTE ONLY): 1434 SLP Time Calculation (min) (ACUTE ONLY): 9 min  Past Medical History:  Past Medical History:  Diagnosis Date  . A-fib (HCC)   . BPH (benign prostatic hyperplasia)   . Coronary artery disease   . Dementia with behavioral disturbance 11/01/2015  . Glaucoma   . H/O hiatal hernia   . Hypertension   . LBBB (left bundle branch block)   . Rib fracture 09/18/2012  . Seizures (HCC)    "h/o gran mal & petit" (08/07/2012)  . Stroke [facility]) 1980's   "mini w/seizure at the same time" (08/07/2012)  . Suicidal ideation   . Syncope and collapse 08/07/2012   "w/loss of consciousness; found down; don't know how long he was out" (08/07/2012)   Past Surgical History:  Past Surgical History:  Procedure Laterality Date  . BRAIN SURGERY    . CORONARY ARTERY BYPASS GRAFT  ~ 2010   CABG X1  . TIBIA FRACTURE SURGERY  1960's   RLE   HPI:  Pt is a 71 year old male admitted with encephalopathy and fall with recent D/C 3/16 with same.  PMHx significant for syncope, stroke, seizures, dementia, CAD, CABG, R tibia fracture surgery - 1960s   Assessment / Plan / Recommendation Clinical Impression  Pt demonstates poor arousal and awareness impacting ability to safely consume PO. Pt is intermittently responsive to tactile cues, moving restlessly in bed, moaning. SLP provided ice chip with total assist for head support (otherwise pt keeps head tilted back) for better manipualtion and safety with ice. Despite this pt had poor labial closure and no active transit. This resulted in multiple involunatry swallows and weak reflexive coughing. Pt is not capable of PO intake at this time, though apparently arousal and acceptance of PO waxes and wanes due to vascular dementia. GIven that plan of care is consistent  with comfort, recommend pts diet continue unrestricted, though staff should offfer purees and pudding with oral suction available to assess pt abiltiy to respond to PO. Will follow for progress and further needs SLP Visit Diagnosis: Dysphagia, oral phase (R13.11)    Aspiration Risk  Risk for inadequate nutrition/hydration;Severe aspiration risk    Diet Recommendation Thin liquid;Regular;Dysphagia 1 (Puree)   Liquid Administration via: Cup;Straw Medication Administration: Crushed with puree    Other  Recommendations Oral Care Recommendations: Oral care BID   Follow up Recommendations 24 hour supervision/assistance      Frequency and Duration min 1 x/week  1 week       Prognosis        Swallow Study   General HPI: Pt is a 71 year old male admitted with encephalopathy and fall with recent D/C 3/16 with same.  PMHx significant for syncope, stroke, seizures, dementia, CAD, CABG, R tibia fracture surgery - 1960s Type of Study: Bedside Swallow Evaluation Diet Prior to this Study: Regular;Thin liquids Temperature Spikes Noted: No Respiratory Status: Room air History of Recent Intubation: No Behavior/Cognition: Lethargic/Drowsy Oral Cavity Assessment: Dried secretions Oral Care Completed by SLP: No Oral Cavity - Dentition: Adequate natural dentition Vision: Impaired for self-feeding Self-Feeding Abilities: Total assist Patient Positioning: Upright in bed Baseline Vocal Quality: Not observed Volitional Cough: Cognitively unable to elicit Volitional Swallow: Unable to elicit    Oral/Motor/Sensory Function Overall Oral Motor/Sensory Function: Generalized oral weakness   Ice Chips Ice chips:  Impaired Presentation: Spoon Oral Phase Impairments: Poor awareness of bolus;Impaired mastication;Reduced lingual movement/coordination;Reduced labial seal Oral Phase Functional Implications: Prolonged oral transit Pharyngeal Phase Impairments: Multiple swallows;Wet Vocal Quality;Throat  Clearing - Immediate   Thin Liquid Thin Liquid: Impaired Presentation: Straw Oral Phase Impairments: Reduced labial seal;Reduced lingual movement/coordination;Poor awareness of bolus Oral Phase Functional Implications: Prolonged oral transit Pharyngeal  Phase Impairments: Multiple swallows;Cough - Immediate    Nectar Thick Nectar Thick Liquid: Not tested   Honey Thick Honey Thick Liquid: Not tested   Puree Puree: Not tested   Solid   GO   Solid: Not tested       Harlon DittyBonnie Marqual Mi, MA CCC-SLP [phone]  Hennessey Cantrell, Riley NearingBonnie Caroline 11/25/2016,3:10 PM

## 2016-11-25 NOTE — Progress Notes (Signed)
qPhysical Therapy Treatment Patient Details Name: [name] MRN: [id number] DOB: August 03, 1946 Today's Date: 11/25/2016    History of Present Illness Pt is a 71 year old male admitted with encephalopathy and fall with recent D/C 3/16 with same.  PMHx significant for syncope, stroke, seizures, dementia, CAD, CABG, R tibia fracture surgery - 1960s    PT Comments    Pt lethargic throughout and despite sitting pt up, providing pericare, repeated transfers, electrode removal and washing face pt unable to open eyes and participate today. Pt returned to supine with bed placed in chair position. Will continue to follow to progress mobility.     Follow Up Recommendations  SNF;Supervision/Assistance - 24 hour     Equipment Recommendations       Recommendations for Other Services       Precautions / Restrictions Precautions Precautions: Fall    Mobility  Bed Mobility Overal bed mobility: Needs Assistance Bed Mobility: Supine to Sit;Sit to Supine     Supine to sit: Total assist;+2 for physical assistance Sit to supine: Max assist;+2 for physical assistance   General bed mobility comments: Pt not following commands for bed mobility; total assist provided for pt from supine to sit x 2 trials. Pt partially blocking trunk on return to bed with RUE but unable to maintain effort with max +2 to return to supine  Transfers                 General transfer comment: unable at this time due to cognition  Ambulation/Gait                 Stairs            Wheelchair Mobility    Modified Rankin (Stroke Patients Only)       Balance Overall balance assessment: Needs assistance   Sitting balance-Leahy Scale: Zero                                      Cognition Arousal/Alertness: Lethargic;Suspect due to medications   Overall Cognitive Status: Difficult to assess                                 General Comments: pt lethargic  throughout session and unable to obtain arousal throughout session despite multiple types of stimulation      Exercises      General Comments        Pertinent Vitals/Pain Pain Assessment:  (PAINAD= 0)    Home Living                      Prior Function            PT Goals (current goals can now be found in the care plan section) Progress towards PT goals: Not progressing toward goals - comment (increased lethargy today with Ativan given previously)    Frequency           PT Plan Current plan remains appropriate    Co-evaluation             End of Session   Activity Tolerance: Patient limited by lethargy Patient left: in bed;with call bell/phone within reach;with bed alarm set Nurse Communication: Mobility status PT Visit Diagnosis: Muscle weakness (generalized) (M62.81);History of falling (Z91.81)     Time: 0810-0829 PT Time Calculation (min) (ACUTE  ONLY): 19 min  Charges:  $Therapeutic Activity: 8-22 mins                    G Codes:          Clytie Shetley B Hady Niemczyk 11/25/2016, 10:22 AM  Delaney MeigsMaija Tabor Zo [place], PT [phone]

## 2016-11-26 LAB — BASIC METABOLIC PANEL
Anion gap: 13 (ref 5–15)
CALCIUM: 9.3 mg/dL (ref 8.9–10.3)
CO2: 25 mmol/L (ref 22–32)
CREATININE: 0.95 mg/dL (ref 0.61–1.24)
Chloride: 100 mmol/L — ABNORMAL LOW (ref 101–111)
GFR calc non Af Amer: >60 mL/min (ref >60)
GLUCOSE: 101 mg/dL — AB (ref 65–99)
Potassium: 3.3 mmol/L — ABNORMAL LOW (ref 3.5–5.1)
SODIUM: 138 mmol/L (ref 135–145)

## 2016-11-26 MED ORDER — LORAZEPAM 2 MG/ML IJ SOLN
1.0000 mg | INTRAMUSCULAR | Status: DC | PRN
  Administered 2016-11-26 – 2016-11-27 (×4): 1 mg via INTRAVENOUS
  Filled 2016-11-26 (×4): qty 1

## 2016-11-26 MED ORDER — POTASSIUM CHLORIDE CRYS ER 20 MEQ PO TBCR
40.0000 meq | EXTENDED_RELEASE_TABLET | Freq: Once | ORAL | Status: AC
  Administered 2016-11-26: 40 meq via ORAL
  Filled 2016-11-26: qty 2
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
## 2016-11-27 NOTE — Progress Notes (Signed)
[facility] Liaison:  RN  Spoke with Maralyn SagoSarah, [facility], earlier to get an update on patient POC.   Haven't received callback yet.  I did speak with Dr. Rito EhrlichKrishnan, and he has been unable to get in touch with Diane, guardian.  I tried to contact Diane also, and no callback at this time.   Phone goes straight to voicemail.    Per Dr. Rito EhrlichKrishnan, prognosis has worsened, patient has very poor PO intake with additional problems of agitation.  Dr. Rito EhrlichKrishnan currently thinking [facility].   Will have to wait until he contacts family member to see if they will be making this a referral to [facility].    Thank you,  Adele BarthelAmy Evans, RN, BSN [facility] Hospital Liaison [phone]  All hospital liaison's are now on AMION.
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
## 2016-11-27 NOTE — Progress Notes (Signed)
Spoke with sister in law/guardian, who states she is now back in town and is planning to get pts house set up for him to go home with hospice. She was concerned about him being agitated and if she could take him home that way. Encouraged her to talk to md in am and Hospice/pallative care in am. Sister n law was in agreement.

## 2016-11-28 DIAGNOSIS — L8990 Pressure ulcer of unspecified site, unspecified stage: Secondary | ICD-10-CM | POA: Insufficient documentation

## 2016-11-28 MED ORDER — MORPHINE SULFATE (CONCENTRATE) 10 MG /0.5 ML PO SOLN: 5.0000 mg | ORAL | 0 refills | Status: AC | PRN

## 2016-11-28 MED ORDER — LORAZEPAM 2 MG/ML IJ SOLN: 1.0000 mg | INTRAMUSCULAR | 0 refills | Status: AC | PRN

## 2016-11-28 MED ORDER — OLANZAPINE 5 MG PO TBDP: 5.0000 mg | ORAL_TABLET | Freq: Every day | ORAL | Status: AC

## 2016-11-28 MED ORDER — LEVETIRACETAM 500 MG PO TABS: 500.0000 mg | ORAL_TABLET | Freq: Two times a day (BID) | ORAL | Status: AC

## 2016-11-28 NOTE — Care Management Note (Signed)
Case Management Note Donn PieriniKristi Josely Moffat RN, BSN Unit 2W-Case Manager [phone]  Patient Details  Name: [name] MRN: [id number] Date of Birth: Jan 08, 1946  Subjective/Objective:   Pt presented with acue encephalopathy and fall- [facility]                  Action/Plan: PTA pt was [facility] (CSW trying to find out which one)- per [facility] pt is active with them for HHRN/PT/OT services- [facility]- per PT eval recommendations for SNF- CSW to follow up on d/c needs.   Expected Discharge Date:            Expected Discharge Plan:  Hospice Medical Facility  In-House Referral:  Clinical Social Work  Discharge planning Services  CM Consult  Post Acute Care Choice:  Hospice Choice offered to:  [facility] / Guardian  DME Arranged:  Hospital bed, Hospice Equipment Package A DME Agency:  [facility]  HH Arranged:  RN, Disease Management HH Agency:  [facility]  Status of Service:  Completed, signed off  If discussed at Long Length of Stay Meetings, dates discussed:    Discharge Disposition: hospice facility   Additional Comments:  11/28/16- 1400- Sander RadonKristi Wilena Tyndall rN, CM- pt with further decline over weekend- now more appropriate for hospice facility- CSW has spoken with Diane pt's guardian- who is agreeable - CM also spoke with Stacie with [facility] who also spoke with Diane -and confirmed [facility]- [facility] and see if there are any beds available today- will await word for possible [facility]- CSW will continue to follow for any further needs.   11/25/16- 1530- Donn PieriniKristi Tory Septer RN, CM- per MD pt should be ready for discharge over the weekend once guardian returns to town- per Amy with [facility] pt has been accepted to Hospice services and they are prepared to accept pt when pt ready for discharge- pt will need  hospital bed and [facility] will be working with family on delivery- call made to pt's sil-diane- to discuss discharge over the weekend- she states that she needs time on return to "get pt's house in order" before she brings him home- she is considering having [facility] if [facility] will [facility] and hire someone to stay with him until she has the house in order- had CSW call [facility] to see if they would allow pt to return- per [facility] at current level of care- he needs more care than they can provide- CSW may need to look at SNF level- and then transition pt into home hospice- [facility]- who states that they can follow pt at SNF as long as pt is not there for rehab and then transition to home. CSW to f/u with Diane for possible SNF option-  Amy with [facility] contact # is [phone] if needed over the weekend.   11/24/16- 1430- Veyda Kaufman RN, CM-  Pt remains in restraints, confusion improved, PC consulted for GOC, received referral to offer home hospice choice- call made to Diane McClanton-sil/guardian- [phone]- [place] in [place]- discussed d/c plans and confirmed with her that she does not want pt to go back [facility] and instead plans to take pt home to his home- address confirmed as the one showing in epic- choice  for home hospice offered- list reviewed of agencies over phone- per Diane- she would like to use [facility]- she also states that she would like a hospital bed for pt. Diane reports that pt will have around the clock care between herself, family members and hired care. She states that she will be returning on Saturday from [place]. Referral called to Adele Barthel with [facility] at [phone]- for home hospice. CM to continue to follow for d/c needs- have notified CSW for plan for d/c along with call made to [facility] to let them know of plan for home with hospice.   Zenda Alpers De Smet, RN 11/28/2016, 2:02 PM

## 2016-11-28 NOTE — Progress Notes (Addendum)
[facility] PROGRESS NOTE  Morrison OldGeorge L Potteiger ONG:[id number] DOB: 03-Jan-1946 DOA: 11/20/2016  [facility]  Brief History/Interval Summary: 71 y.o.malewith medical history significant of A Fib, dementia, multiple falls, seizures; presented after a fall and also encephalopathy. Patient had recently been discharged from the hospital on 3/14. He was admitted for toxic metabolic encephalopathy of unclear etiology. At that time, he was recommended to be discharged to skilled nursing facility, however, family had declined and took him back to assisted living facility with home health services. Early morning on 3/18, patient was found on the floor after an unwitnessed fall. He was sent to the emergency department where workup was largely unremarkable. He was sent back to assisted living home. He then presented back to the emergency department on the afternoon of 3/18 after patient was found crawling on the floor, being combative. He does have history of multiple falls and dementia, but had not been aggressive or combative in the past. He was given Haldol in the EMS, and Geodon in the emergency department and became bradycardic. Bradycardia has now resolved. Patient continues to be encephalopathic with only marginal improvement, if any at all.  Reason for Visit: Acute encephalopathy  Consultants: Palliative medicine  Procedures:  EEG IMPRESSION: This is an abnormal EEG demonstrating a mild diffuse slowing of electrocerebral activity.  This can be seen in a wide variety of encephalopathic state including those of a toxic, metabolic, or degenerative nature.  There were no focal, hemispheric, or lateralizing features.  No epileptiform activity was recorded.  As above, portions of the tracing were contaminated with myogenic artifact due to movement artifact from the patient, making these portions difficult to interpret.  Correlate clinically.  Transthoracic echocardiogram Study  Conclusions  - Left ventricle: The cavity size was normal. Wall thickness was   normal. Systolic function was normal. The estimated ejection   fraction was in the range of 60% to 65%. Wall motion was normal;   there were no regional wall motion abnormalities. Features are   consistent with a pseudonormal left ventricular filling pattern,   with concomitant abnormal relaxation and increased filling   pressure (grade 2 diastolic dysfunction). - Mitral valve: Mildly calcified annulus. - Right ventricle: Systolic function was mildly reduced. - Pulmonary arteries: Systolic pressure was moderately increased.   PA peak pressure: 53 mm Hg (S).  Antibiotics: None  Subjective/Interval History: Patient remains encephalopathic. Does not answer any questions.  Per nursing staff he has poor oral intake. He appears to have worsened in the last 24 hours.  ROS: Unable to do due to encephalopathy  Objective:  Vital Signs  Vitals:   11/27/16 0358 11/27/16 1428 11/27/16 2220 11/28/16 0620  BP: 118/87 101/62 (!) 144/70 (!) 122/94  Pulse: 82 80 91 92  Resp: 20 20 20 20   Temp: 97.4 F (36.3 C) 98.5 F (36.9 C) 97 F (36.1 C) 99.2 F (37.3 C)  TempSrc: Axillary Oral Axillary Axillary  SpO2: 94% 100% 94% 97%  Weight:      Height:        Intake/Output Summary (Last 24 hours) at 11/28/16 0941 Last data filed at 11/28/16 0641  Gross per 24 hour  Intake                0 ml  Output              900 ml  Net             -900 ml  Filed Weights   11/21/16 0459  Weight: 72.3 kg (159 lb 6.4 oz)    General appearance: Not very responsive. No distress. Resp: clear to auscultation bilaterally Cardio: regular rate and rhythm, S1, S2 normal, no murmur, click, rub or gallop GI: soft, non-tender; bowel sounds normal; no masses,  no organomegaly Neurologic: Does not respond. He does get agitated when touched.  Lab Results:  Data Reviewed: I have personally reviewed following labs and imaging  studies  CBC:  Recent Labs Lab 11/22/16 0350 11/23/16 0457 11/25/16 0257  WBC 5.9 6.3 6.9  HGB 8.6* 8.8* 9.7*  HCT 28.4* 29.0* 30.9*  MCV 84.8 83.8 81.5  PLT 230 239 262    Basic Metabolic Panel:  Recent Labs Lab 11/22/16 0350 11/23/16 0457 11/25/16 0257 11/26/16 0231  NA 141 142 136 138  K 3.8 3.4* 2.8* 3.3*  CL 108 107 101 100*  CO2 21* 23 25 25   GLUCOSE 74 81 105* 101*  BUN 8 9 7  <5*  CREATININE 1.18 1.10 0.97 0.95  CALCIUM 8.8* 9.1 9.0 9.3    GFR: Estimated Creatinine Clearance: 70.8 mL/min (by C-G formula based on SCr of 0.95 mg/dL).   Recent Labs Lab 11/23/16 1415  AMMONIA 56*    CBG:  Recent Labs Lab 11/21/16 1114  GLUCAP 79     Radiology Studies: No results found.   Medications:  Scheduled: . aspirin EC  81 mg Oral Daily  . brimonidine  1 drop Both Eyes BID  . brinzolamide  1 drop Both Eyes BID  . enoxaparin (LOVENOX) injection  40 mg Subcutaneous Daily  . finasteride  5 mg Oral Daily  . lactulose  10 g Oral BID  . lamoTRIgine  200 mg Oral BID  . latanoprost  1 drop Both Eyes QHS  . levETIRAcetam  500 mg Oral BID  . OLANZapine zydis  5 mg Oral QHS  . polyvinyl alcohol  1 drop Both Eyes 5 X Daily  . senna-docusate  2 tablet Oral Daily  . sodium chloride flush  3 mL Intravenous Q12H  . timolol  1 drop Both Eyes BID   Continuous:  [id number]:[id number], LORazepam, LORazepam **OR** [DISCONTINUED] LORazepam  Assessment/Plan:  Principal Problem:   Acute encephalopathy Active Problems:   BPH (benign prostatic hyperplasia)   Seizure disorder (HCC)   Dementia with behavioral disturbance   Atrial fibrillation and flutter (HCC)   Dyslipidemia     Acute encephalopathy, unclear etiology  -With hx of dementia  -Patient was recently admitted and discharged on 3/14 due to toxic metabolic encephalopathy of unclear etiology, most likely due to polypharmacy. Patient had been on multiple medications at the time which were changed at time of  discharge.  -Urinalysis negative for infectious process. UDS negative. CT head: no acute intracranial findings. Old strokes identified. -MRI brain was attempted but could not be completed due to agitation. EEG does not show any epileptiform activity. -Has required soft bilateral wrist restraints when he became physically abusive and combative with staff. - Patient's mental status continues to worsen. Does not show any evidence for neurological deficits. Unlikely to be able to cooperate with MRI. Previous symptoms have not been successful. Patient does have dementia baseline. This could just be worsening of his underlying disease. MRI brain from 2014 did show advanced atrophy. Discussed in detail with patient's guardian. She is able to pursuing palliative care and hospice. Palliative medicine was consulted. He is thought appropriate for hospice. Hospice has been consulted. Plan was for him to go  home with hospice services. However, his condition has worsened in the last 24 hours. He is not responding at all. His life expectancy is likely shorter than what was thought earlier. Patient appears to be appropriate for residential hospice. Main focus will be comfort care. Discussed with patient's guardian. Will discontinue medications that does not provide comfort.  Paroxysmal atrial fibrillation, with bradycardia  -Chadsvasc greater than 3. Had been on Eliquis in the past. At last admission, risks and benefits were discussed. Due to multiple falls, anticoagulation was deemed too high risk. Continue aspirin -After admission was called, patient became bradycardic in the ED. thought to be due to medications. Now improved. Per sister-in-law, patient was [facility] to [facility] in late December 2017 due to A fib and low HR. At the time, pacemaker was discussed but family decided not to pursue it. He eventually became stable and was discharged at that time. Unlikely that this is contributing to his current  presentation. Now hospice/comfort care.  Minimal peripheral edema -BNP was 601.1. No evidence for overt fluid overload. -Echo as above. Grade 2 diastolic dysfunction is noted. Systolic function is normal. Hold off on any diuretics for now due to poor oral intake.  Multiple falls -Last admission, patient and familyhad refused SNF placement and was discharged to ALF with [facility] services  -CT head and cervical spine without acute injury  -Lumbar xray with possible L2 acute compression fracture. Unable to adequately assess for pain currently   Chronic normocytic anemia -Work up consistent with anemia chronic disease   Seizures -Patient is on Lamictal and Keppra. However, he is not been able to take his oral medications consistently. Use Ativan for seizures. Comfort care is being focus.  Hyperlipidemia -Pravachol will be discontinued  BPH -Comfort care. We'll discontinue finasteride. Continue Foley.  Hypokalemia. This was repleted.  Urinary Retention Continue Foley catheter.  DVT Prophylaxis: Lovenox will be discontinued as focus now is comfort care. Code Status: DO NOT RESUSCITATE  Family Communication: No family at bedside.  Disposition Plan: Discussed yesterday with patient's sister-in-law. She came by to see the patient yesterday and has communicated to the social worker that she will not be able to appropriately care for him at home. She agrees to residential hospice placement. This will be pursued.     LOS: 7 days   [facility]  [facility] Pager [phone] 11/28/2016, 9:41 AM  If 7PM-7AM, please contact night-coverage at [email or web address], password [email or web address]

## 2016-11-28 NOTE — Progress Notes (Addendum)
  Speech Language Pathology Treatment: Dysphagia  Patient Details Name: [name] MRN: [id number] DOB: 1946-08-01 Today's Date: 11/28/2016 Time: [id number] SLP Time Calculation (min) (ACUTE ONLY): 8 min  Assessment / Plan / Recommendation Clinical Impression  Pt with occasional unintelligible verbalizations, moaning and consistent snoring like respirations. Pt mouth breathing resulting in xerostomia with ST providing oral care. Not safe/appropriate for any po (ice chip) presently with tenuous respirations. Pt discharging to hospice facility. Continue oral care and moisten oral cavity, offer ice chip if appropriate for comfort. Will sign off.   HPI HPI: Pt is a 71 year old male admitted with encephalopathy and fall with recent D/C 3/16 with same.  PMHx significant for syncope, stroke, seizures, dementia, CAD, CABG, R tibia fracture surgery - 1960s      SLP Plan  Discharge SLP treatment due to (comment)       Recommendations  Diet recommendations: Dysphagia 1 (puree) (ice chips if able) Liquids provided via: Teaspoon Medication Administration: Crushed with puree Supervision: Staff to assist with self feeding Postural Changes and/or Swallow Maneuvers: Seated upright 90 degrees                Oral Care Recommendations: Oral care QID Follow up Recommendations: 24 hour supervision/assistance SLP Visit Diagnosis: Dysphagia, oral phase (R13.11) Plan: Discharge SLP treatment due to (comment)       GO                Royce MacadamiaLitaker, Rob Mciver Willis 11/28/2016, 3:00 PM

## 2016-11-28 NOTE — Progress Notes (Signed)
CSW spoke to patients guardian (Diane) via phone. Diane stated she has decided on placing patient at a residential hospice. Patient stated she does not think she would be able to give patient the care he needs at home. Patient stated [facility].  Marrianne MoodAshley Suzzane Quilter, MSW,  [employer] [phone]

## 2016-11-28 NOTE — Progress Notes (Signed)
Clinical Social Worker facilitated patient discharge including contacting patient family and facility to confirm patient discharge plans.  Clinical information faxed to facility and family agreeable with plan.  CSW arranged [facility] .  RN Hansel Starlingdrienne to call [phone] report prior to discharge.  Clinical Social Worker will sign off for now as social work intervention is no longer needed. Please consult us again if new need arises.  Marrianne MoodAshley Dexter Sauser, MSW, [employer]WA [phone]

## 2016-11-28 NOTE — Progress Notes (Signed)
[facility] Hospital L[facility] Note:  Spoke to [place], Diane who confirmed interest [facility] today.  Plan is for patient to transfer today.  Meeting with [facility] at [facility] to complete paperwork.  RN please call report to [facility] at [phone].  Please fax completed discharge summary to [phone].  Please feel free to call with any questions.  Thank you, Hessie KnowsStacie Wilkinson RN, [facility] Hospital Liaison [phone]

## 2016-11-28 NOTE — Progress Notes (Signed)
Report called Cordelia PenSherry at [facility].

## 2016-11-28 NOTE — Discharge Summary (Signed)
[facility]  Physician Discharge Summary   Patient ID: [name] MRN: [id number] DOB/AGE: 04/29/1946 71 y.o.  Admit date: 11/20/2016 Discharge date: 11/28/2016  PCP: [facility]  DISCHARGE DIAGNOSES:  Principal Problem:   Acute encephalopathy Active Problems:   BPH (benign prostatic hyperplasia)   Seizure disorder (HCC)   Dementia with behavioral disturbance   Atrial fibrillation and flutter (HCC)   Dyslipidemia   Pressure injury of skin   RECOMMENDATIONS FOR OUTPATIENT FOLLOW UP: 1. Patient going to residential hospice. Comfort care.  DISCHARGE CONDITION: poor  Diet recommendation: Comfort feeds as tolerated  Filed Weights   11/21/16 0459  Weight: 72.3 kg (159 lb 6.4 oz)    INITIAL HISTORY: 71 y.o.malewith medical history significant of A Fib, dementia, multiple falls, seizures; presented after a fall and also encephalopathy. Patient had recently been discharged from the hospital on 3/14. He was admitted for toxic metabolic encephalopathy of unclear etiology. At that time, he was recommended to be discharged to skilled nursing facility, however, family had declined and took him back to assisted living facility with home health services. Early morning on 3/18, patient was found on the floor after an unwitnessed fall. He was sent to the emergency department where workup was largely unremarkable. He was sent back to assisted living home. He then presented back to the emergency department on the afternoon of 3/18 after patient was found crawling on the floor, being combative. He does have history of multiple falls and dementia, but had not been aggressive or combative in the past. He was given Haldol in the EMS, and Geodon in the emergency department and became bradycardic. Bradycardia has now resolved. Patient continues to be encephalopathic with only marginal improvement, if any at all.  Consultants: Palliative medicine  Procedures:   EEG IMPRESSION: This is an abnormal EEG demonstrating a mild diffuse slowing of electrocerebral activity. This can be seen in a wide variety of encephalopathic state including those of a toxic, metabolic, or degenerative nature. There were no focal, hemispheric, or lateralizing features. No epileptiform activity was recorded. As above, portions of the tracing were contaminated with myogenic artifact due to movement artifact from the patient, making these portions difficult to interpret. Correlate clinically.  Transthoracic echocardiogram Study Conclusions  - Left ventricle: The cavity size was normal. Wall thickness was normal. Systolic function was normal. The estimated ejection fraction was in the range of 60% to 65%. Wall motion was normal; there were no regional wall motion abnormalities. Features are consistent with a pseudonormal left ventricular filling pattern, with concomitant abnormal relaxation and increased filling pressure (grade 2 diastolic dysfunction). - Mitral valve: Mildly calcified annulus. - Right ventricle: Systolic function was mildly reduced. - Pulmonary arteries: Systolic pressure was moderately increased. PA peak pressure: 53 mm Hg (S).   HOSPITAL COURSE:   Acute encephalopathy, unclear etiology  -Patient with history of dementia  -Patient was recently admitted and discharged on 3/14 due to toxic metabolic encephalopathy of unclear etiology, most likely due to polypharmacy. Patient had been on multiple medications at the time which were changed at time of discharge.  -Urinalysis negative for infectious process. UDS negative. CT head: no acute intracranial findings. Old strokes identified. -MRI brain was attempted but could not be completed due to agitation. EEG does not show any epileptiform activity. -Has required soft bilateral wrist restraints when he became physically abusive and combative with staff. - Patient's mental status continues  to worsen. Does not show any evidence for neurological deficits. Unlikely  to be able to cooperate with MRI. Previous symptoms have not been successful. Patient does have dementia baseline. This could just be worsening of his underlying disease. MRI brain from 2014 did show advanced atrophy. Discussed in detail with patient's guardian. She is able to pursuing palliative care and hospice. Palliative medicine was consulted. He was thought appropriate for hospice. Plan was for him to go home with hospice services. However, his condition has worsened in the last 24 hours. He is not responding at all. His life expectancy is likely shorter than what was thought earlier. Patient appears to be appropriate for residential hospice. Main focus will be comfort care.   Paroxysmal atrial fibrillation, with bradycardia  -Chadsvasc greater than 3. Had been on Eliquis in the past. At last admission, risks and benefits were discussed. Due to multiple falls, anticoagulation was deemed too high risk and was discontinued. -After admission was called, patient became bradycardic in the ED. thought to be due to medications. Now improved. Per sister-in-law, patient was admitted to [facility] in late December 2017 due to A fib and low HR. At the time, pacemaker was discussed but family decided not to pursue it. He eventually became stable and was discharged at that time. Unlikely that this is contributing to his current presentation. Now hospice/comfort care.  Minimal peripheral edema -BNP was 601.1. No evidence for overt fluid overload. -Echo as above. Grade 2 diastolic dysfunction is noted. Systolic function is normal.   Multiple falls -Last admission, patient and familyhad refused SNF placement and was discharged to ALF with [facility] services  -CT head and cervical spine without acute injury  -Lumbar xray with possible L2 acute compression fracture.  Chronic normocytic anemia -Work up consistent with anemia chronic  disease   Seizures -Patient is on Lamictal and Keppra. However, he is not been able to take his oral medications consistently. Use Ativan for seizures. Comfort care is main focus.  Hyperlipidemia -Pravachol will be discontinued  BPH -Comfort care. We'll discontinue finasteride. Continue Foley.  Hypokalemia. This was repleted.  Urinary Retention Continue Foley catheter.  Overall, patient's prognosis remains poor. His life expectancy is likely days to maybe 2 weeks. Plan is for him to go to residential hospice.   PERTINENT LABS:  The results of significant diagnostics from this hospitalization (including imaging, microbiology, ancillary and laboratory) are listed below for reference.     Labs: Basic Metabolic Panel:  Recent Labs Lab 11/22/16 0350 11/23/16 0457 11/25/16 0257 11/26/16 0231  NA 141 142 136 138  K 3.8 3.4* 2.8* 3.3*  CL 108 107 101 100*  CO2 21* 23 25 25   GLUCOSE 74 81 105* 101*  BUN 8 9 7  <5*  CREATININE 1.18 1.10 0.97 0.95  CALCIUM 8.8* 9.1 9.0 9.3    Recent Labs Lab 11/23/16 1415  AMMONIA 56*   CBC:  Recent Labs Lab 11/22/16 0350 11/23/16 0457 11/25/16 0257  WBC 5.9 6.3 6.9  HGB 8.6* 8.8* 9.7*  HCT 28.4* 29.0* 30.9*  MCV 84.8 83.8 81.5  PLT 230 239 262   BNP: BNP (last 3 results)  Recent Labs  11/20/16 1424  BNP 601.1*    IMAGING STUDIES Dg Chest 2 View  Result Date: 11/16/2016 CLINICAL DATA:  Altered mental status, increase falls EXAM: CHEST  2 VIEW COMPARISON:  Portable chest x-ray of 10/31/2015 FINDINGS: The lungs remain hyperaerated. There is a vague haziness in the left upper lobe and developing pneumonia cannot be excluded. Followup chest x-ray is recommended. There is some rotation  of the patient turned slightly to the right which may accentuate the superior mediastinum on the left, but a left superior mediastinal soft tissue lesion cannot be excluded with some indentation upon the upper thoracic tracheal air shadow.  CT of the chest may be helpful to assess further. This appears more prominent than noted on the prior chest x-ray. Cardiomegaly is stable as is a moderate size hiatal hernia. No acute bony abnormality is seen. 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 IMPRESSION: 1. Vague haziness in the left upper lobe. Cannot exclude developing pneumonia. Consider followup chest x-ray. 2. More soft tissue fullness to the left peritracheal region at the level of the left clavicular head may be due to rotation but a soft tissue lesion cannot be excluded. Consider CT of the chest with IV contrast. 3. Stable cardiomegaly and moderate size hiatal hernia. Electronically Signed   By: Dwyane Dee M.D.   On: 11/16/2016 14:36   Dg Thoracic Spine 2 View  Result Date: 11/20/2016 CLINICAL DATA:  Fall EXAM: THORACIC SPINE 2 VIEWS COMPARISON:  Chest CT 11/16/2016 FINDINGS: There is no evidence of thoracic spine fracture. Alignment is normal. Multilevel chronic vertebral body height loss. IMPRESSION: No acute abnormality. Electronically Signed   By: Deatra Robinson M.D.   On: 11/20/2016 02:48   Dg Lumbar Spine 2-3 Views  Result Date: 11/20/2016 CLINICAL DATA:  Back pain after a fall. EXAM: LUMBAR SPINE - 2-3 VIEW COMPARISON:  CT chest abdomen and pelvis 11/16/2006 FINDINGS: Normal alignment of the lumbar spine. Ballooning of interspaces with endplate depression deformities at T12-L1, L1-2, L2-3, L3-4, and L4-5 levels, likely representing changes due to osteoporosis. There is slight anterior cortical irregularity of the superior endplate of L2 could represent acute fracture. No focal bone lesion or bone destruction. Visualized sacrum appears intact. Vascular calcifications. Postoperative changes in the right hip. IMPRESSION: Multiple endplate compression deformities with ballooning of interspaces likely representing osteoporotic change. Slight anterior cortical irregularity of the superior endplate of L2 could represent acute  compression fracture. No displacement or retropulsion of fracture fragments. Electronically Signed   By: Burman Nieves M.D.   On: 11/20/2016 02:51   Dg Wrist Complete Left  Result Date: 11/20/2016 CLINICAL DATA:  Left arm pain after a fall. Back pain. History of dementia. EXAM: LEFT WRIST - COMPLETE 3+ VIEW COMPARISON:  None. FINDINGS: Compression deformity of the trapezium is associated with degenerative change, probably representing chronic process rather than acute fracture. No definite evidence of any acute fracture or dislocation in the left wrist. Soft tissues are unremarkable. IMPRESSION: Compression deformity of the trapezium with associated degenerative changes probably representing old deformity. No definite evidence of acute fracture. Electronically Signed   By: Burman Nieves M.D.   On: 11/20/2016 02:48   Ct Head Wo Contrast  Result Date: 11/20/2016 CLINICAL DATA:  Post fall. Altered mental status and combative since found. Dementia. EXAM: CT HEAD WITHOUT CONTRAST CT CERVICAL SPINE WITHOUT CONTRAST TECHNIQUE: Multidetector CT imaging of the head and cervical spine was performed following the standard protocol without intravenous contrast. Multiplanar CT image reconstructions of the cervical spine were also generated. COMPARISON:  11/20/2016 at 1:13 a.m. and 08/29/2016 FINDINGS: CT HEAD FINDINGS Brain: Ventricles and cisterns are unchanged. CSF spaces are within normal. There is evidence of an old right sided infarct involving the temporal and posterior parietal region. Associated dilatation of the atria and temporal horn of the right lateral ventricle. Mild chronic ischemic microvascular disease. Old left cerebellar infarct. No mass, mass  effect, shift of midline structures or acute hemorrhage. No evidence of acute infarction. Vascular: Calcified plaque over the cavernous segment of the internal carotid arteries. Skull: Within normal. Sinuses/Orbits: Within normal. Other: None. CT CERVICAL  SPINE FINDINGS Alignment: Within normal. The atlantoaxial articulation is within normal. Skull base and vertebrae: Moderate spondylosis throughout the cervical spine with prominent anterior osteophytes. Mild uncovertebral joint spurring. No acute fracture. Soft tissues and spinal canal: Spinal canal is within normal. Prevertebral soft tissues are normal. Disc levels:  Within normal. Upper chest: Within normal. Other: Moderate calcified plaque over the carotid bifurcations. IMPRESSION: No acute intracranial findings. 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 No acute cervical spine injury. Moderate spondylosis throughout the cervical spine. Electronically Signed   By: Elberta Fortis M.D.   On: 11/20/2016 15:47   Ct Head Wo Contrast  Result Date: 11/20/2016 CLINICAL DATA:  Larey Seat tonight twice. No known head injury. Patient is on blood thinners. History of dementia. EXAM: CT HEAD WITHOUT CONTRAST CT CERVICAL SPINE WITHOUT CONTRAST TECHNIQUE: Multidetector CT imaging of the head and cervical spine was performed following the standard protocol without intravenous contrast. Multiplanar CT image reconstructions of the cervical spine were also generated. COMPARISON:  08/29/2016 FINDINGS: CT HEAD FINDINGS Brain: Old area of encephalomalacia involving the right temporal and parietal lobes consistent with old infarct in the distribution of the right middle cerebral artery. There is associated dilatation of the temporal and posterior horns of the right lateral ventricle due to volume loss. Old left inferior cerebellar infarct. Diffuse cerebral atrophy. Ventricular dilatation consistent with central atrophy. Low-attenuation changes in the deep white matter consistent with small vessel ischemia. No mass effect or midline shift. No abnormal extra-axial fluid collections. Gray-white matter junctions are distinct. Basal cisterns are not effaced. No acute intracranial  hemorrhage. Vascular: Vascular calcifications are present. Skull: Calvarium appears intact. Sinuses/Orbits: Paranasal sinuses and mastoid air cells are clear. Other: No significant changes since previous study. CT CERVICAL SPINE FINDINGS Alignment: Normal alignment of the cervical vertebrae and facet joints. C1-2 articulation appears intact. Ligamentous and soft tissue calcification at the C1-2 level consistent with degenerative change. Skull base and vertebrae: No vertebral compression deformities. No focal bone lesion or bone destruction. Soft tissues and spinal canal: No prevertebral fluid or swelling. No visible canal hematoma. Disc levels: Diffuse degenerative change throughout the cervical spine with narrowed interspaces and endplate hypertrophic changes. Prominent bridging osteophytes from C3 through C7. Degenerative changes throughout the facet joints. Upper chest: Negative. Other: None. IMPRESSION: No acute intracranial abnormalities. Diffuse chronic small vessel ischemia and atrophy. Old infarcts in the right temporal parietal and left inferior cerebellar regions. Normal alignment of the cervical spine. Diffuse degenerative change throughout. No acute displaced fractures identified. Electronically Signed   By: Burman Nieves M.D.   On: 11/20/2016 02:38   Ct Chest W Contrast  Result Date: 11/16/2016 CLINICAL DATA:  Abnormal chest x-ray.  Rule out pneumonia. EXAM: CT CHEST WITH CONTRAST TECHNIQUE: Multidetector CT imaging of the chest was performed during intravenous contrast administration. CONTRAST:  100 ISOVUE-300 IOPAMIDOL (ISOVUE-300) INJECTION 61% COMPARISON:  11/16/2016.  Chest CT 11/16/2006 FINDINGS: Cardiovascular: Cardiomegaly. Aorta is normal caliber. Mildly prominent main pulmonary artery, 3.5 cm. Prior CABG. Mediastinum/Nodes: Borderline sized mediastinal lymph nodes. AP window lymph node has a short axis diameter of 13 mm. Right paratracheal lymph node has a short axis diameter of 10 mm.  Right paratracheal node more inferiorly has a short axis diameter of 14 mm. No  hilar or axillary adenopathy. Large hiatal hernia. Lungs/Pleura: There is vascular congestion. Trace left pleural effusion. No confluent opacities to suggest pneumonia. In particular, no evidence for pneumonia in the left upper lobe. No suspicious pulmonary nodules. Upper Abdomen: Imaging into the upper abdomen shows no acute findings. Mild diffuse fatty infiltration of the liver. Musculoskeletal: Chest wall soft tissues are unremarkable. No acute bony abnormality. IMPRESSION: Cardiomegaly with vascular congestion. Mildly enlarged pulmonary artery suggests pulmonary arterial hypertension. Prior CABG. Trace left pleural effusion. No evidence of pneumonia. Borderline sized mediastinal lymph nodes, likely reactive. Fatty infiltration of the liver. Large hiatal hernia. Electronically Signed   By: Charlett Nose M.D.   On: 11/16/2016 16:30   Ct Cervical Spine Wo Contrast  Result Date: 11/20/2016 CLINICAL DATA:  Post fall. Altered mental status and combative since found. Dementia. EXAM: CT HEAD WITHOUT CONTRAST CT CERVICAL SPINE WITHOUT CONTRAST TECHNIQUE: Multidetector CT imaging of the head and cervical spine was performed following the standard protocol without intravenous contrast. Multiplanar CT image reconstructions of the cervical spine were also generated. COMPARISON:  11/20/2016 at 1:13 a.m. and 08/29/2016 FINDINGS: CT HEAD FINDINGS Brain: Ventricles and cisterns are unchanged. CSF spaces are within normal. There is evidence of an old right sided infarct involving the temporal and posterior parietal region. Associated dilatation of the atria and temporal horn of the right lateral ventricle. Mild chronic ischemic microvascular disease. Old left cerebellar infarct. No mass, mass effect, shift of midline structures or acute hemorrhage. No evidence of acute infarction. Vascular: Calcified plaque over the cavernous segment of the internal  carotid arteries. Skull: Within normal. Sinuses/Orbits: Within normal. Other: None. CT CERVICAL SPINE FINDINGS Alignment: Within normal. The atlantoaxial articulation is within normal. Skull base and vertebrae: Moderate spondylosis throughout the cervical spine with prominent anterior osteophytes. Mild uncovertebral joint spurring. No acute fracture. Soft tissues and spinal canal: Spinal canal is within normal. Prevertebral soft tissues are normal. Disc levels:  Within normal. Upper chest: Within normal. Other: Moderate calcified plaque over the carotid bifurcations. IMPRESSION: No acute intracranial findings. 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 No acute cervical spine injury. Moderate spondylosis throughout the cervical spine. Electronically Signed   By: Elberta Fortis M.D.   On: 11/20/2016 15:47   Ct Cervical Spine Wo Contrast  Result Date: 11/20/2016 CLINICAL DATA:  Larey Seat tonight twice. No known head injury. Patient is on blood thinners. History of dementia. EXAM: CT HEAD WITHOUT CONTRAST CT CERVICAL SPINE WITHOUT CONTRAST TECHNIQUE: Multidetector CT imaging of the head and cervical spine was performed following the standard protocol without intravenous contrast. Multiplanar CT image reconstructions of the cervical spine were also generated. COMPARISON:  08/29/2016 FINDINGS: CT HEAD FINDINGS Brain: Old area of encephalomalacia involving the right temporal and parietal lobes consistent with old infarct in the distribution of the right middle cerebral artery. There is associated dilatation of the temporal and posterior horns of the right lateral ventricle due to volume loss. Old left inferior cerebellar infarct. Diffuse cerebral atrophy. Ventricular dilatation consistent with central atrophy. Low-attenuation changes in the deep white matter consistent with small vessel ischemia. No mass effect or midline shift. No abnormal extra-axial fluid  collections. Gray-white matter junctions are distinct. Basal cisterns are not effaced. No acute intracranial hemorrhage. Vascular: Vascular calcifications are present. Skull: Calvarium appears intact. Sinuses/Orbits: Paranasal sinuses and mastoid air cells are clear. Other: No significant changes since previous study. CT CERVICAL SPINE FINDINGS Alignment: Normal alignment of the cervical vertebrae and facet joints.  C1-2 articulation appears intact. Ligamentous and soft tissue calcification at the C1-2 level consistent with degenerative change. Skull base and vertebrae: No vertebral compression deformities. No focal bone lesion or bone destruction. Soft tissues and spinal canal: No prevertebral fluid or swelling. No visible canal hematoma. Disc levels: Diffuse degenerative change throughout the cervical spine with narrowed interspaces and endplate hypertrophic changes. Prominent bridging osteophytes from C3 through C7. Degenerative changes throughout the facet joints. Upper chest: Negative. Other: None. IMPRESSION: No acute intracranial abnormalities. Diffuse chronic small vessel ischemia and atrophy. Old infarcts in the right temporal parietal and left inferior cerebellar regions. Normal alignment of the cervical spine. Diffuse degenerative change throughout. No acute displaced fractures identified. Electronically Signed   By: Burman NievesWilliam  Stevens M.D.   On: 11/20/2016 02:38    DISCHARGE EXAMINATION: See progress note from earlier today  DISPOSITION: Residential hospice    ALLERGIES:  Allergies  Allergen Reactions  . Codeine Rash     Current Discharge Medication List    START taking these medications   Details  levETIRAcetam (KEPPRA) 500 MG tablet Take 1 tablet (500 mg total) by mouth 2 (two) times daily.    LORazepam (ATIVAN) 2 MG/ML injection Inject 0.5 mLs (1 mg total) into the vein every 4 (four) hours as needed for anxiety (agitation). Qty: 1 mL, Refills: 0    Morphine Sulfate (MORPHINE  CONCENTRATE) 10 mg / 0.5 ml concentrated solution Place 0.25 mLs (5 mg total) under the tongue every 3 (three) hours as needed for severe pain. Refills: 0    OLANZapine zydis (ZYPREXA) 5 MG disintegrating tablet Take 1 tablet (5 mg total) by mouth at bedtime.      CONTINUE these medications which have NOT CHANGED   Details  brimonidine (ALPHAGAN) 0.2 % ophthalmic solution Place 1 drop into both eyes 2 (two) times daily. Reported on 01/05/2016    carboxymethylcellulose 1 % ophthalmic solution Place 1 drop into both eyes 5 (five) times daily.    dorzolamide-timolol (COSOPT) 22.3-6.8 MG/ML ophthalmic solution Place 1 drop into both eyes 2 (two) times daily.    lamoTRIgine (LAMICTAL) 200 MG tablet Take 200 mg by mouth 2 (two) times daily.    latanoprost (XALATAN) 0.005 % ophthalmic solution Place 1 drop into both eyes at bedtime.      STOP taking these medications     albuterol (PROVENTIL HFA;VENTOLIN HFA) 108 (90 Base) MCG/ACT inhaler      aspirin 81 MG tablet      calcium carbonate (TUMS - DOSED IN MG ELEMENTAL CALCIUM) 500 MG chewable tablet      escitalopram (LEXAPRO) 5 MG tablet      ferrous sulfate 325 (65 FE) MG tablet      finasteride (PROSCAR) 5 MG tablet      omeprazole (PRILOSEC) 20 MG capsule      senna-docusate (SENOKOT-S) 8.6-50 MG tablet          Follow-up Information    [facility].   Specialty:  Hospice and Palliative Medicine Why:  Home Hospice referral made Contact information: [address] [place] [id number] [phone]           TOTAL DISCHARGE TIME: 35 minutes  [facility]ecaKRISHNAN,Vershawn Westrup  [facility] Pager [phone]  11/28/2016, 2:42 PM

## 2016-11-28 NOTE — Care Management Important Message (Signed)
Important Message  Patient Details  Name: [name] MRN: [id number] Date of Birth: August 02, 1946   Medicare Important Message Given:  Yes    Kyla BalzarineShealy, Szymon Foiles [place] 11/28/2016, 3:37 PM

## 2016-12-04 DEATH — deceased
